# Patient Record
Sex: Male | Born: 1955 | Race: White | Hispanic: No | Smoking: Never smoker
Health system: Southern US, Community
[De-identification: ages and names within clinical notes are randomized; demographics above are authoritative.]

## PROBLEM LIST (undated history)

## (undated) DIAGNOSIS — R7989 Other specified abnormal findings of blood chemistry: Secondary | ICD-10-CM

## (undated) DIAGNOSIS — K219 Gastro-esophageal reflux disease without esophagitis: Secondary | ICD-10-CM

## (undated) DIAGNOSIS — J449 Chronic obstructive pulmonary disease, unspecified: Secondary | ICD-10-CM

## (undated) DIAGNOSIS — J45909 Unspecified asthma, uncomplicated: Secondary | ICD-10-CM

## (undated) DIAGNOSIS — I214 Non-ST elevation (NSTEMI) myocardial infarction: Principal | ICD-10-CM

## (undated) DIAGNOSIS — I2510 Atherosclerotic heart disease of native coronary artery without angina pectoris: Secondary | ICD-10-CM

## (undated) DIAGNOSIS — R079 Chest pain, unspecified: Secondary | ICD-10-CM

## (undated) DIAGNOSIS — F988 Other specified behavioral and emotional disorders with onset usually occurring in childhood and adolescence: Secondary | ICD-10-CM

## (undated) DIAGNOSIS — E785 Hyperlipidemia, unspecified: Secondary | ICD-10-CM

## (undated) DIAGNOSIS — C801 Malignant (primary) neoplasm, unspecified: Secondary | ICD-10-CM

## (undated) DIAGNOSIS — C9110 Chronic lymphocytic leukemia of B-cell type not having achieved remission: Secondary | ICD-10-CM

## (undated) HISTORY — DX: Non-ST elevation (NSTEMI) myocardial infarction: I21.4

## (undated) HISTORY — DX: Unspecified asthma, uncomplicated: J45.909

## (undated) HISTORY — PX: OTHER SURGICAL HISTORY: SHX169

---

## 1999-07-07 ENCOUNTER — Ambulatory Visit (HOSPITAL_COMMUNITY): Admission: RE | Admit: 1999-07-07 | Discharge: 1999-07-07 | Payer: Self-pay | Admitting: Gastroenterology

## 2000-04-15 ENCOUNTER — Ambulatory Visit (HOSPITAL_COMMUNITY): Admission: RE | Admit: 2000-04-15 | Discharge: 2000-04-15 | Payer: Self-pay | Admitting: Gastroenterology

## 2003-04-27 ENCOUNTER — Encounter: Payer: Self-pay | Admitting: Emergency Medicine

## 2003-04-27 ENCOUNTER — Emergency Department (HOSPITAL_COMMUNITY): Admission: EM | Admit: 2003-04-27 | Discharge: 2003-04-28 | Payer: Self-pay | Admitting: Emergency Medicine

## 2012-04-01 ENCOUNTER — Encounter (HOSPITAL_COMMUNITY): Payer: Self-pay | Admitting: *Deleted

## 2012-04-01 ENCOUNTER — Emergency Department (HOSPITAL_COMMUNITY)
Admission: EM | Admit: 2012-04-01 | Discharge: 2012-04-01 | Disposition: A | Discharge status: Home / Self Care | Payer: PRIVATE HEALTH INSURANCE | Attending: Emergency Medicine | Admitting: Emergency Medicine

## 2012-04-01 DIAGNOSIS — K219 Gastro-esophageal reflux disease without esophagitis: Secondary | ICD-10-CM | POA: Insufficient documentation

## 2012-04-01 DIAGNOSIS — R21 Rash and other nonspecific skin eruption: Secondary | ICD-10-CM

## 2012-04-01 HISTORY — DX: Other specified behavioral and emotional disorders with onset usually occurring in childhood and adolescence: F98.8

## 2012-04-01 HISTORY — DX: Other specified abnormal findings of blood chemistry: R79.89

## 2012-04-01 HISTORY — DX: Gastro-esophageal reflux disease without esophagitis: K21.9

## 2012-04-01 MED ORDER — PREDNISONE 20 MG PO TABS: 60.0000 mg | ORAL_TABLET | Freq: Every day | ORAL | Status: AC

## 2012-04-01 MED ORDER — HYDROXYZINE HCL 25 MG PO TABS: 25.0000 mg | ORAL_TABLET | Freq: Four times a day (QID) | ORAL | Status: AC

## 2012-04-01 NOTE — ED Notes (Addendum)
Pt c/o rash on arm, legs, and left and right side of abdomen. Pt states he noticed it at 2130 when he was putting his kids to sleep. Pt reports rash has gotten better. It went all the way down his ankles, but now it presents only by his thighs. He took Careers adviser and a hot shower. Reports itching and warmness. No tightness in chest or SOB. Pt was cleaning out drains and doing yard work today, but normally works indoors. No environmental changes or new food noted.

## 2012-04-01 NOTE — ED Provider Notes (Signed)
History     CSN: [id number]  Arrival date & time 04/01/12  0123   First MD Initiated Contact with Patient 04/01/12 0209     2:44 AM HPI Reports this evening developed a rash over his bilateral arms and legs. Reports red, itchy and fast spreading. Denies new foods, allergies, new soap, lotions or detergents. Denies wheezing, chest pain, SOB, or cough. Denies known allergies Patient is a 56 y.o. male presenting with rash. The history is provided by the patient.  Rash  This is a new problem. The problem has not changed since onset.The problem is associated with an unknown factor. There has been no fever. The rash is present on the left arm, left upper leg, right arm and right upper leg. The patient is experiencing no pain. Associated symptoms include itching. Pertinent negatives include no blisters, no pain and no weeping. He has tried anti-itch cream for the symptoms. The treatment provided no relief.    Past Medical History  Diagnosis Date  . GERD (gastroesophageal reflux disease)   . Low testosterone   . ADD (attention deficit disorder)     History reviewed. No pertinent past surgical history.  No family history on file.  History  Substance Use Topics  . Smoking status: Never Smoker   . Smokeless tobacco: Not on file  . Alcohol Use: No      Review of Systems  Constitutional: Negative for fever and chills.  Skin: Positive for itching and rash.  All other systems reviewed and are negative.    Allergies  Review of patient's allergies indicates no known allergies.  Home Medications   Current Outpatient Rx  Name Route Sig Dispense Refill  . ESOMEPRAZOLE MAGNESIUM 40 MG PO CPDR Oral Take 40 mg by mouth daily before breakfast.    . LISDEXAMFETAMINE DIMESYLATE 60 MG PO CAPS Oral Take 60 mg by mouth every morning.    Marland Kitchen OMEGA-3-ACID ETHYL ESTERS 1 G PO CAPS Oral Take 4 g by mouth daily.    Marland Kitchen OVER THE COUNTER MEDICATION Oral Take 4 capsules by mouth 2 (two) times daily. OTC.  Food & vegetable Vitamin    . TESTOSTERONE 75 MG IL PLLT Implant 1 each by Implant route as directed. Patient states that he receives this medication every 5 month for low testosterone      BP 125/80  Pulse 81  Temp 97.6 F (36.4 C) (Oral)  Resp 18  Wt 176 lb 6 oz (80.003 kg)  SpO2 100%  Physical Exam  Constitutional: He is oriented to person, place, and time. He appears well-developed and well-nourished.  HENT:  Head: Normocephalic and atraumatic.  Mouth/Throat: No oropharyngeal exudate.  Eyes: Pupils are equal, round, and reactive to light.  Cardiovascular: Normal rate and regular rhythm.   No murmur heard. Pulmonary/Chest: Effort normal and breath sounds normal. No stridor. No respiratory distress. He has no wheezes.  Lymphadenopathy:    He has no cervical adenopathy.  Neurological: He is alert and oriented to person, place, and time.  Skin: Skin is warm and dry. Rash (ERytematous, maculopapular rash along bilateral arms, legs, abdomen and back. ) noted. No erythema. No pallor.  Psychiatric: He has a normal mood and affect. His behavior is normal.    ED Course  Procedures   MDM     Unknown cause of rash. Suspect allergen vs contact. Will Give prednisone, and hydroxyzine. Also given referral to Derm. Pt voices understanding and is ready for d/c     Thomasene Lot, PA-C  04/01/12 0315 

## 2012-04-01 NOTE — ED Notes (Signed)
Pt c/o redness rash over arms and legs

## 2012-04-02 NOTE — ED Provider Notes (Signed)
Medical screening examination/treatment/procedure(s) were performed by non-physician practitioner and as supervising physician I was immediately available for consultation/collaboration.   Maleke Feria L Talib Headley, MD 04/02/12 0620 

## 2012-05-10 ENCOUNTER — Emergency Department
Admission: EM | Admit: 2012-05-10 | Discharge: 2012-05-10 | Disposition: A | Discharge status: Home / Self Care | Payer: PRIVATE HEALTH INSURANCE | Source: Home / Self Care | Attending: Family Medicine | Admitting: Family Medicine

## 2012-05-10 ENCOUNTER — Encounter: Payer: Self-pay | Admitting: *Deleted

## 2012-05-10 DIAGNOSIS — H6120 Impacted cerumen, unspecified ear: Secondary | ICD-10-CM

## 2012-05-10 DIAGNOSIS — H6121 Impacted cerumen, right ear: Secondary | ICD-10-CM

## 2012-05-10 DIAGNOSIS — J0100 Acute maxillary sinusitis, unspecified: Secondary | ICD-10-CM

## 2012-05-10 MED ORDER — NEOMYCIN-POLYMYXIN-HC 3.5-10000-1 OT SUSP: 4.0000 [drp] | Freq: Three times a day (TID) | OTIC | Status: AC

## 2012-05-10 MED ORDER — AMOXICILLIN 875 MG PO TABS: 875.0000 mg | ORAL_TABLET | Freq: Two times a day (BID) | ORAL | Status: AC

## 2012-05-10 NOTE — ED Notes (Signed)
Pt c/o nasal congestion, sinus pressure, cough,and post nasal drip x 2-3 wks.  Denies fever. He has taken zyrtec and zyrtec D with no relief.

## 2012-05-10 NOTE — ED Provider Notes (Signed)
History     CSN: [id number]  Arrival date & time 05/10/12  1555   First MD Initiated Contact with Patient 05/10/12 1618      Chief Complaint  Patient presents with  . Nasal Congestion     HPI Comments: Pt c/o nasal congestion, sinus pressure, cough,and post nasal drip x 2-3 wks.  Denies fever. He has taken zyrtec and zyrtec D with no relief. He also complains of his right ear being clogged with decreased hearing.  He states that he has a history of rhinitis.  The history is provided by the patient.    Past Medical History  Diagnosis Date  . GERD (gastroesophageal reflux disease)   . Low testosterone   . ADD (attention deficit disorder)     History reviewed. No pertinent past surgical history.  Family History  Problem Relation Age of Onset  . Emphysema Mother   . Heart failure Father     History  Substance Use Topics  . Smoking status: Never Smoker   . Smokeless tobacco: Not on file  . Alcohol Use: Yes      Review of Systems No sore throat + mild cough No pleuritic pain No wheezing + nasal congestion + post-nasal drainage + sinus pain/pressure No itchy/red eyes + right earache No hemoptysis No SOB ? fever, + chills No nausea No vomiting No abdominal pain No diarrhea No urinary symptoms No skin rashes + fatigue No myalgias + headache Used OTC meds without relief  Allergies  Review of patient's allergies indicates no known allergies.  Home Medications   Current Outpatient Rx  Name Route Sig Dispense Refill  . AMOXICILLIN 875 MG PO TABS Oral Take 1 tablet (875 mg total) by mouth 2 (two) times daily. 28 tablet 0  . ESOMEPRAZOLE MAGNESIUM 40 MG PO CPDR Oral Take 40 mg by mouth daily before breakfast.    . LISDEXAMFETAMINE DIMESYLATE 60 MG PO CAPS Oral Take 60 mg by mouth every morning.    Marland Kitchen NEOMYCIN-POLYMYXIN-HC 3.5-10000-1 OT SUSP Right Ear Place 4 drops into the right ear 3 (three) times daily. 7.5 mL 0  . OMEGA-3-ACID ETHYL ESTERS 1 G PO CAPS  Oral Take 4 g by mouth daily.    Marland Kitchen OVER THE COUNTER MEDICATION Oral Take 4 capsules by mouth 2 (two) times daily. OTC. Food & vegetable Vitamin    . TESTOSTERONE 75 MG IL PLLT Implant 1 each by Implant route as directed. Patient states that he receives this medication every 5 month for low testosterone      BP 106/73  Pulse 101  Temp 98 F (36.7 C) (Oral)  Resp 16  Ht 5\' 11"  (1.803 m)  Wt 167 lb (75.751 kg)  BMI 23.29 kg/m2  SpO2 96%  Physical Exam Nursing notes and Vital Signs reviewed. Appearance:  Patient appears healthy, stated age, and in no acute distress Eyes:  Pupils are equal, round, and reactive to light and accomodation.  Extraocular movement is intact.  Conjunctivae are not inflamed  Ears:   Right canal completely occluded with cerumen.  Left canal and tympanic membrane normal.  Nose:  Moderately congested turbinates.  Maxillary sinus tenderness is present.  Pharynx:  Normal Neck:  Supple.   No adenopathy Lungs:  Clear to auscultation.  Breath sounds are equal.  Heart:  Regular rate and rhythm without murmurs, rubs, or gallops.  Abdomen:  Nontender without masses or hepatosplenomegaly.  Bowel sounds are present.  No CVA or flank tenderness.  Extremities:  No edema.  No calf tenderness Skin:  No rash present.   ED Course  Procedures  Right ear lavage:  Post lavage, right tympanic membrane is normal.  Right distal canal is erythematous       1. Acute maxillary sinusitis   2. Impacted cerumen of right ear       MDM  Begin amoxicillin for two weeks. Begin Cortisporin otic suspension in right canal for 3 to 4 days to minimize possibility of developing an otitis externa. Take Mucinex D (guaifenesin with decongestant) twice daily for congestion.  Increase fluid intake, rest. May use Afrin nasal spray (or generic oxymetazoline) twice daily for about 5 days.  Also recommend using saline nasal spray several times daily and saline nasal irrigation (AYR is a common  brand) Stop all antihistamines for now, and other non-prescription cough/cold preparations. Use prescription ear drops in right ear for 3 to 4 days. Followup with ENT if not resolved two weeks.        Lattie Haw, MD 05/11/12 1001

## 2012-08-07 DIAGNOSIS — I214 Non-ST elevation (NSTEMI) myocardial infarction: Secondary | ICD-10-CM

## 2012-08-07 HISTORY — DX: Non-ST elevation (NSTEMI) myocardial infarction: I21.4

## 2012-08-18 ENCOUNTER — Inpatient Hospital Stay (HOSPITAL_COMMUNITY)
Admission: EM | Admit: 2012-08-18 | Discharge: 2012-08-19 | DRG: 247 | Disposition: A | Discharge status: Home / Self Care | Payer: PRIVATE HEALTH INSURANCE | Attending: Cardiology | Admitting: Cardiology

## 2012-08-18 ENCOUNTER — Encounter (HOSPITAL_COMMUNITY)
Admission: EM | Disposition: A | Discharge status: Home / Self Care | Payer: Self-pay | Source: Home / Self Care | Attending: Cardiology

## 2012-08-18 ENCOUNTER — Encounter (HOSPITAL_COMMUNITY): Payer: Self-pay | Admitting: *Deleted

## 2012-08-18 ENCOUNTER — Emergency Department (HOSPITAL_COMMUNITY): Payer: PRIVATE HEALTH INSURANCE

## 2012-08-18 DIAGNOSIS — I2510 Atherosclerotic heart disease of native coronary artery without angina pectoris: Secondary | ICD-10-CM

## 2012-08-18 DIAGNOSIS — E785 Hyperlipidemia, unspecified: Secondary | ICD-10-CM | POA: Diagnosis present

## 2012-08-18 DIAGNOSIS — I214 Non-ST elevation (NSTEMI) myocardial infarction: Secondary | ICD-10-CM

## 2012-08-18 DIAGNOSIS — K219 Gastro-esophageal reflux disease without esophagitis: Secondary | ICD-10-CM | POA: Diagnosis present

## 2012-08-18 DIAGNOSIS — R079 Chest pain, unspecified: Secondary | ICD-10-CM

## 2012-08-18 DIAGNOSIS — Z955 Presence of coronary angioplasty implant and graft: Secondary | ICD-10-CM

## 2012-08-18 HISTORY — PX: LEFT HEART CATHETERIZATION WITH CORONARY ANGIOGRAM: SHX5451

## 2012-08-18 HISTORY — DX: Hyperlipidemia, unspecified: E78.5

## 2012-08-18 HISTORY — PX: CORONARY ANGIOPLASTY WITH STENT PLACEMENT: SHX49

## 2012-08-18 HISTORY — PX: PERCUTANEOUS CORONARY STENT INTERVENTION (PCI-S): SHX5485

## 2012-08-18 LAB — TROPONIN I: Troponin I: 4.16 ng/mL (ref <0.30)

## 2012-08-18 LAB — CBC WITH DIFFERENTIAL/PLATELET
Basophils Relative: 0 % (ref 0–1)
Eosinophils Relative: 5 % (ref 0–5)
HCT: 44.9 % (ref 39.0–52.0)
Hemoglobin: 15.9 g/dL (ref 13.0–17.0)
Lymphocytes Relative: 38 % (ref 12–46)
Lymphs Abs: 4.8 10*3/uL — ABNORMAL HIGH (ref 0.7–4.0)
MCV: 95.5 fL (ref 78.0–100.0)
Monocytes Relative: 9 % (ref 3–12)
Neutro Abs: 6.1 10*3/uL (ref 1.7–7.7)
WBC: 12.6 10*3/uL — ABNORMAL HIGH (ref 4.0–10.5)

## 2012-08-18 LAB — BASIC METABOLIC PANEL
BUN: 15 mg/dL (ref 6–23)
CO2: 24 mEq/L (ref 19–32)
Chloride: 101 mEq/L (ref 96–112)
GFR calc Af Amer: >90 mL/min (ref >90)
Glucose, Bld: 102 mg/dL — ABNORMAL HIGH (ref 70–99)
Potassium: 3.9 mEq/L (ref 3.5–5.1)

## 2012-08-18 SURGERY — LEFT HEART CATHETERIZATION WITH CORONARY ANGIOGRAM
Anesthesia: LOCAL

## 2012-08-18 MED ORDER — SODIUM CHLORIDE 0.9 % IV SOLN: 250.0000 mL | INTRAVENOUS | Status: DC | PRN

## 2012-08-18 MED ORDER — OXYCODONE-ACETAMINOPHEN 5-325 MG PO TABS
1.0000 | ORAL_TABLET | ORAL | Status: DC | PRN
Start: 2012-08-18 — End: 2012-08-19

## 2012-08-18 MED ORDER — ONDANSETRON HCL 4 MG/2ML IJ SOLN: 4.0000 mg | Freq: Four times a day (QID) | INTRAMUSCULAR | Status: DC | PRN

## 2012-08-18 MED ORDER — SODIUM CHLORIDE 0.9 % IV SOLN
INTRAVENOUS | Status: DC
  Administered 2012-08-18: 10:00:00 via INTRAVENOUS

## 2012-08-18 MED ORDER — ACETAMINOPHEN 325 MG PO TABS
650.0000 mg | ORAL_TABLET | ORAL | Status: DC | PRN
  Administered 2012-08-18: 22:00:00 650 mg via ORAL
  Filled 2012-08-18: qty 2

## 2012-08-18 MED ORDER — MIDAZOLAM HCL 2 MG/2ML IJ SOLN
INTRAMUSCULAR | Status: AC
  Filled 2012-08-18: qty 2

## 2012-08-18 MED ORDER — NITROGLYCERIN IN D5W 200-5 MCG/ML-% IV SOLN
10.0000 ug/min | INTRAVENOUS | Status: DC
  Administered 2012-08-18: 10 ug/min via INTRAVENOUS
  Filled 2012-08-18: qty 250

## 2012-08-18 MED ORDER — PRASUGREL HCL 10 MG PO TABS
ORAL_TABLET | ORAL | Status: AC
  Filled 2012-08-18: qty 6

## 2012-08-18 MED ORDER — ACETAMINOPHEN 325 MG PO TABS: 650.0000 mg | ORAL_TABLET | ORAL | Status: DC | PRN

## 2012-08-18 MED ORDER — HEPARIN (PORCINE) IN NACL 100-0.45 UNIT/ML-% IJ SOLN
1000.0000 [IU]/h | INTRAMUSCULAR | Status: DC
  Administered 2012-08-18: 1000 [IU]/h via INTRAVENOUS
  Filled 2012-08-18: qty 250

## 2012-08-18 MED ORDER — SODIUM CHLORIDE 0.9 % IV SOLN
INTRAVENOUS | Status: AC
  Administered 2012-08-18: 17:00:00 via INTRAVENOUS

## 2012-08-18 MED ORDER — DIAZEPAM 5 MG PO TABS: 5.0000 mg | ORAL_TABLET | ORAL | Status: DC

## 2012-08-18 MED ORDER — PRASUGREL HCL 10 MG PO TABS
10.0000 mg | ORAL_TABLET | Freq: Every day | ORAL | Status: DC
  Administered 2012-08-19: 10 mg via ORAL
  Filled 2012-08-18 (×2): qty 1

## 2012-08-18 MED ORDER — ASPIRIN 81 MG PO CHEW
81.0000 mg | CHEWABLE_TABLET | Freq: Every day | ORAL | Status: DC
  Administered 2012-08-19: 09:00:00 81 mg via ORAL
  Filled 2012-08-18: qty 1

## 2012-08-18 MED ORDER — ALPRAZOLAM 0.25 MG PO TABS: 0.2500 mg | ORAL_TABLET | Freq: Two times a day (BID) | ORAL | Status: DC | PRN

## 2012-08-18 MED ORDER — PANTOPRAZOLE SODIUM 40 MG PO TBEC
80.0000 mg | DELAYED_RELEASE_TABLET | Freq: Every day | ORAL | Status: DC
  Filled 2012-08-18: qty 2

## 2012-08-18 MED ORDER — MORPHINE SULFATE 2 MG/ML IJ SOLN: 2.0000 mg | INTRAMUSCULAR | Status: DC | PRN

## 2012-08-18 MED ORDER — SODIUM CHLORIDE 0.9 % IJ SOLN: 3.0000 mL | INTRAMUSCULAR | Status: DC | PRN

## 2012-08-18 MED ORDER — OMEGA-3-ACID ETHYL ESTERS 1 G PO CAPS
4.0000 g | ORAL_CAPSULE | Freq: Every day | ORAL | Status: DC
  Administered 2012-08-19: 4 g via ORAL
  Filled 2012-08-18: qty 4

## 2012-08-18 MED ORDER — ATORVASTATIN CALCIUM 80 MG PO TABS
80.0000 mg | ORAL_TABLET | Freq: Every day | ORAL | Status: DC
  Administered 2012-08-18: 80 mg via ORAL
  Filled 2012-08-18 (×2): qty 1

## 2012-08-18 MED ORDER — NITROGLYCERIN 0.2 MG/ML ON CALL CATH LAB
INTRAVENOUS | Status: AC
  Filled 2012-08-18: qty 1

## 2012-08-18 MED ORDER — FENTANYL CITRATE 0.05 MG/ML IJ SOLN
INTRAMUSCULAR | Status: AC
  Filled 2012-08-18: qty 2

## 2012-08-18 MED ORDER — HEPARIN (PORCINE) IN NACL 2-0.9 UNIT/ML-% IJ SOLN
INTRAMUSCULAR | Status: AC
  Filled 2012-08-18: qty 1000

## 2012-08-18 MED ORDER — NITROGLYCERIN 0.4 MG SL SUBL: 0.4000 mg | SUBLINGUAL_TABLET | SUBLINGUAL | Status: DC | PRN

## 2012-08-18 MED ORDER — SODIUM CHLORIDE 0.9 % IJ SOLN: 3.0000 mL | Freq: Two times a day (BID) | INTRAMUSCULAR | Status: DC

## 2012-08-18 MED ORDER — METHYLPHENIDATE HCL ER 10 MG PO TBCR: 28.0000 mg | EXTENDED_RELEASE_TABLET | Freq: Every day | ORAL | Status: DC

## 2012-08-18 MED ORDER — LIDOCAINE HCL (PF) 1 % IJ SOLN
INTRAMUSCULAR | Status: AC
  Filled 2012-08-18: qty 30

## 2012-08-18 MED ORDER — ASPIRIN 81 MG PO CHEW: 324.0000 mg | CHEWABLE_TABLET | ORAL | Status: DC

## 2012-08-18 MED ORDER — ZOLPIDEM TARTRATE 5 MG PO TABS: 5.0000 mg | ORAL_TABLET | Freq: Every evening | ORAL | Status: DC | PRN

## 2012-08-18 MED ORDER — BIVALIRUDIN 250 MG IV SOLR
INTRAVENOUS | Status: AC
  Filled 2012-08-18: qty 250

## 2012-08-18 MED ORDER — HEPARIN BOLUS VIA INFUSION
4000.0000 [IU] | Freq: Once | INTRAVENOUS | Status: AC
  Administered 2012-08-18: 4000 [IU] via INTRAVENOUS

## 2012-08-18 NOTE — ED Notes (Signed)
Patient presents via EMS with c/o chest pain.  The pain was straight across his chest - no other complaints  No SOB, diaphoresis, etc.  Took 4 81mg  ASA at home and 1 NTG by EMS with no relief.  States his pain is now a 6 or 7

## 2012-08-18 NOTE — Progress Notes (Signed)
ANTICOAGULATION CONSULT NOTE - Initial Consult  Pharmacy Consult for heparin Indication: chest pain/ACS  No Known Allergies  Patient Measurements:   Heparin Dosing Weight: 75.8kg  Vital Signs: Temp: 97.6 F (36.4 C) (12/12 0503) Temp src: Oral (12/12 0503) BP: 90/62 mmHg (12/12 0915) Pulse Rate: 72  (12/12 0915)  Labs:  Basename 08/18/12 0800 08/18/12 0508  HGB -- 15.9  HCT -- 44.9  PLT -- 236  APTT -- --  LABPROT -- --  INR -- --  HEPARINUNFRC -- --  CREATININE -- 0.86  CKTOTAL -- --  CKMB -- --  TROPONINI 4.72* <0.30    The CrCl is unknown because both a height and weight (above a minimum accepted value) are required for this calculation.   Medical History: Past Medical History  Diagnosis Date  . GERD (gastroesophageal reflux disease)   . Low testosterone   . ADD (attention deficit disorder)    Assessment: 41 yom presented to the ED with chest pain that feels more epigastric in nature. His initial troponin was negative but now elevated at 4.72. He is not on any anticoagulants PTA. His CBC is WNL.  Goal of Therapy:  Heparin level 0.3-0.7 units/ml Monitor platelets by anticoagulation protocol: Yes   Plan:  1. Heparin bolus 4000 units IV x 1 2. Heparin gtt 1000 units/hr 3. Check a 6 hour heparin level 4. Daily heparin level and CBC  Melvin Martin, Melvin Martin 08/18/2012,10:23 AM

## 2012-08-18 NOTE — H&P (Signed)
History and Physical   Patient ID: Melvin Martin MRN: [id number], DOB/AGE: 01/27/1956 56 y.o. Date of Encounter: 08/18/2012  Primary Physician: Kari Baars, MD Primary Cardiologist: none  Chief Complaint:  NSTEMI  HPI: Melvin Martin is a 56 year old male with no previous cardiac history who was wakened by severe substernal chest pain, radiating into both arms to the elbow. No change with position or deep inspiration. No associated symptoms. EMS was called, he received ASA, NTG without relief. It was initially a 10/10, down to a 6/10 by arrival to the ER. Since then, it has decreased and is now only aching in his left arm. He has never had this pain before. His initial Troponin was not elevated, but the second one was > 4. He is much more comfortable now, but not pain-free.  Past Medical History  Diagnosis Date  . GERD (gastroesophageal reflux disease)   . Low testosterone   . ADD (attention deficit disorder)      Surgical History:  Past Surgical History  Procedure Date  . Cyst removal, mouth      I have reviewed the patient's current medications. Prior to Admission medications   Medication Sig Start Date End Date Taking? Authorizing Provider  esomeprazole (NEXIUM) 40 MG capsule Take 40 mg by mouth daily before breakfast.   Yes Historical Provider, MD  methylphenidate (CONCERTA) 27 MG CR tablet Take 27 mg by mouth every morning.   Yes Historical Provider, MD  omega-3 acid ethyl esters (LOVAZA) 1 G capsule Take 4 g by mouth daily.   Yes Historical Provider, MD  OVER THE COUNTER MEDICATION Take 4 capsules by mouth 2 (two) times daily. OTC. Food & vegetable Vitamin   Yes Historical Provider, MD  Testosterone (TESTOPEL) 75 MG PLLT 1 each by Implant route as directed. Patient states that he receives this medication every 5 month for low testosterone   Yes Historical Provider, MD    Scheduled Meds:  Continuous Infusions:    . sodium chloride 75 mL/hr at 08/18/12 1025  . heparin      . heparin    . nitroGLYCERIN 10 mcg/min (08/18/12 1022)   PRN Meds:.nitroGLYCERIN  Allergies: No Known Allergies  History   Social History  . Marital Status: Married    Spouse Name: N/A    Number of Children: N/A  . Years of Education: N/A   Occupational History  . Not on file.   Social History Main Topics  . Smoking status: Never Smoker   . Smokeless tobacco: Never Used  . Alcohol Use: Yes  . Drug Use: No  . Sexually Active: Not on file   Other Topics Concern  . Not on file   Social History Narrative   Lives with wife and 2 small children    Family History  Problem Relation Age of Onset  . Emphysema Mother   . Heart failure Father   . Heart attack Father   . Heart attack Cousin    Review of Systems: Occasional MS aches and pains, no recent illnesses, fevers or chills. Never has melena, reflux symptoms generally well-controlled. Full 14-point review of systems otherwise negative except as noted above.  Physical Exam: Blood pressure 90/62, pulse 72, temperature 97.6 F (36.4 C), temperature source Oral, resp. rate 15, SpO2 100.00%. General: Well developed, well nourished, male in no acute distress. Head: Normocephalic, atraumatic, sclera non-icteric, no xanthomas, nares are without discharge. Dentition: good Neck: No carotid bruits. JVD not elevated. No thyromegally Lungs: Good expansion  bilaterally. without wheezes or rhonchi.  Heart: Regular rate and rhythm with S1 S2.  No S3 or S4.  No murmur, no rubs, or gallops appreciated. Abdomen: Soft, non-tender, non-distended with normoactive bowel sounds. No hepatomegaly. No rebound/guarding. No obvious abdominal masses. Msk:  Strength and tone appear normal for age. No joint deformities or effusions, no spine or costo-vertebral angle tenderness. Extremities: No clubbing or cyanosis. No edema.  Distal pedal pulses are 2+ in 4 extrem Neuro: Alert and oriented X 3. Moves all extremities spontaneously. No focal deficits  noted. Psych:  Responds to questions appropriately with a normal affect. Skin: No rashes or lesions noted  Labs:   Lab Results  Component Value Date   WBC 12.6* 08/18/2012   HGB 15.9 08/18/2012   HCT 44.9 08/18/2012   MCV 95.5 08/18/2012   PLT 236 08/18/2012   No results found for this basename: INR in the last 72 hours   Lab 08/18/12 0508  NA 137  K 3.9  CL 101  CO2 24  BUN 15  CREATININE 0.86  CALCIUM 9.6  PROT --  BILITOT --  ALKPHOS --  ALT --  AST --  GLUCOSE 102*    Basename 08/18/12 0800 08/18/12 0508  CKTOTAL -- --  CKMB -- --  TROPONINI 4.72* <0.30   Radiology/Studies:  Dg Chest Portable 1 View 08/18/2012  *RADIOLOGY REPORT*  Clinical Data: Chest pain  PORTABLE CHEST - 1 VIEW  Comparison: None.  Findings: Shallow inspiration.  Normal heart size and pulmonary vascularity.  Linear atelectasis or infiltration in the lung bases. No blunting of costophrenic angles.  No pneumothorax.  Mediastinal contours appear intact.  IMPRESSION: Shallow inspiration with infiltration or atelectasis in both lung bases.   Original Report Authenticated By: Burman Nieves, M.D.    ECG: 18-Aug-2012 04:58:40 [facility]/ED ROUTINE RECORD SINUS RHYTHM ~ normal P axis, V-rate 50- 99 Normal ECG 42mm/s 24mm/mV 150Hz  8.0.1 12SL 235 CID: [id number] Referred by: Confirmed By: Nelva Nay MD Vent. rate 69 BPM PR interval 192 ms QRS duration 80 ms QT/QTc 372/398 ms P-R-T axes 15 65 58  ASSESSMENT AND PLAN:  Principal Problem:  *Non-ST elevation myocardial infarction (NSTEMI), initial care episode - will add IV NTG and heparin, cath today, lab aware. The risks and benefits of a cardiac catheterization including, but not limited to, death, stroke, MI, kidney damage and bleeding were discussed with the patient who indicates understanding and agrees to proceed. Add ASA to meds, HR low 50s so no BB for now.  Hyperlipidemia - will check in am, empiric statin.  Active  Problems:  GERD (gastroesophageal reflux disease) - continue Rx.   Signed,  Bjorn Loser Barrett PA-C 08/18/2012, 10:31 AM Patient seen and examined and history reviewed. Agree with above findings and plan. Very pleasant 56 yo WM with family history of early CAD, and history of low HDL presents with severe SSCP 9/10 that awoke him from sleep and lasted about 4 hours. Still has mild pain in elbows. Ecg and exam are normal. Troponin is elevated >4. Will admit. IV heparin and Ntg. Start po metoprolol. Plan urgent cardiac cath today.The procedure and risks were reviewed including but not limited to death, myocardial infarction, stroke, arrythmias, bleeding, transfusion, emergency surgery, dye allergy, or renal dysfunction. The patient voices understanding and is agreeable to proceed.  Theron Arista [facility] 08/18/2012 11:01 AM

## 2012-08-18 NOTE — ED Notes (Signed)
Pt. Denies any chest pain or sob.

## 2012-08-18 NOTE — ED Notes (Signed)
Pt.has had shaving wrist and groin area.for the cath lab.

## 2012-08-18 NOTE — ED Provider Notes (Signed)
House available for supervision, consultation to the relevant portion of this patient's CDU care.  He was previously placed on the chest pain protocol.  When a second troponin was elevated, although he was not in any pain, we consulted cardiology, initiated drip of heparin, and the patient was admitted for further evaluation and management.  Gerhard Munch, MD 08/18/12 [phone]

## 2012-08-18 NOTE — ED Notes (Signed)
Reported Elevated/Panic Troponin 4.72 to [employer], [place]

## 2012-08-18 NOTE — CV Procedure (Signed)
   Cardiac Catheterization Procedure Note  Name: Melvin Martin MRN: [id number] DOB: 01-26-1956  Procedure: Left Heart Cath, Selective Coronary Angiography, LV angiography, PTCA and stenting of the LAD and ramus intermedius  Indication: NSTEMI.  Procedural Details:  The right wrist was prepped, draped, and anesthetized with 1% lidocaine. Using the modified Seldinger technique, a 5 French sheath was introduced into the right radial artery. 3 mg of verapamil was administered through the sheath, weight-based unfractionated heparin was administered intravenously. Standard Judkins catheters were used for selective coronary angiography and left ventriculography. Catheter exchanges were performed over an exchange length guidewire.  PROCEDURAL FINDINGS Hemodynamics: AO 98/60 LV 100/7   Coronary angiography: Coronary dominance: right  Left mainstem: widely patent  Left anterior descending (LAD): Heavily diseased vessel with ectasia and nonobstructive proximal plaque. The mid-vessel has diffuse 75% stenosis with irregularity and lesion hypodensity. The distal vessel is patent without disease. There is small D2 branch with total occlusion and contrast stain.  Left circumflex (LCx): Large vessel, Ramus intermedius has critical 99% stenosis in the mid portion and mild nonobstructive disease in the prox vessel. The AV groove circ and OM branches are patent with nonobstructive disease.   Right coronary artery (RCA): Dominant, diffuse irregularity but no significant angiographic disease. PDA and PLA branches patent.   Left ventriculography: Left ventricular systolic function preserved with an LVEF of 55%. There is mild hypokinesis of the anterolateral wall.   PCI Note:  Following the diagnostic procedure, the decision was made to proceed with PCI. There was critical stenosis of the ramus branch and severe stenosis with hypodensity and irregularity in the mid-LAD. The radial sheath was upsized to a 6  [place]. Weight-based bivalirudin was given for anticoagulation. Effient 60 mg was administered. Once a therapeutic ACT was achieved, a 6 [place] XB-LAD 3.5 cm  guide catheter was inserted.  A BMW coronary guidewire was used to cross the lesion in the Ramus.  The lesion was predilated with a 2.5x15 mm balloon.  The lesion was then stented with a 2.75 x 16 mm  Promus Premier DES stent.  The stent was postdilated with a 3.0 x 12 mm noncompliant balloon.  Following PCI, there was 0% residual stenosis and TIMI-3 flow. Attention was then turned to the LAD. The vessel was wired with the same BMW wire. A 3.75x20 mm [place] balloon was used to measure the length of the lesion (it was not dilated). The lesion was then stented with a 3.5x32 mm Promus Premier DES. The stent was post-dilated with the 3.75 [place] balloon to high pressure. Final angiography confirmed an excellent result with TIMI-3 flow and 0% residual stenosis. The patient tolerated the procedure well. There were no immediate procedural complications. A TR band was used for radial hemostasis. The patient was transferred to the post catheterization recovery area for further monitoring.  PCI Data: LESION 1 Vessel - Ramus/Segment - mid Percent Stenosis (pre)  99 TIMI-flow 3 Stent 2.75x16 mm Promus DES Percent Stenosis (post) 0 TIMI-flow (post) 3  LESION 2 Vessel - LAD/Segment - mid Percent Stenosis (pre)  75 TIMI-flow 3 Stent 3.5x32 mm Promus DES Percent Stenosis (post) 0 TIMI-flow (post) 3  Final Conclusions:   1. 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 Nonobstructive RCA stenosis 3. Mild LV contraction abnormality with overall normal LVEF 55%  Recommendations:  DAPT with ASA and effient x 12 months, aggressive risk modification.  Tonny Bollman 08/18/2012, 6:22 PM

## 2012-08-18 NOTE — ED Provider Notes (Signed)
7:10 AM Patient moved to CDU under chest pain protocol. Patient resting comfortably at present without return of chest pain. Lungs CTA bilaterally. S1/S2, RRR, no murmur. Abdomen soft, bowel sounds present. Strong distal pulses palpated all extremities. Sinus rhythm on monitor without ectopy. Troponin negative x 1. 12 lead reviewed, no indication of ischemia. Patient scheduled for Coronary CT this afternoon. Diagnostic and treatment plan discussed with patient.  9:00 AM Informed of elevated second trop (4.72). Pt with significant family hx, but denies HTN, HLD, DM, or smoking. 324 ASA taken prior to arrival. Pt currently CP free. PCP Dr. Clelia Croft at [facility]. Cardiology Consulted and will admit pt for cardiac cath, heperin drip to be started  CRITICAL CARE Performed by: Jaci Carrel   Total critical care time: 30  Critical care time was exclusive of separately billable procedures and treating other patients.  Critical care was necessary to treat or prevent imminent or life-threatening deterioration.  Critical care was time spent personally by me on the following activities: development of treatment plan with patient and/or surrogate as well as nursing, discussions with consultants, evaluation of patient's response to treatment, examination of patient, obtaining history from patient or surrogate, ordering and performing treatments and interventions, ordering and review of laboratory studies, ordering and review of radiographic studies, pulse oximetry and re-evaluation of patient's condition.    Jaci Carrel, [place] 08/18/12 1157

## 2012-08-18 NOTE — ED Notes (Signed)
Patient states that he ate a casa a dia and some shrimp for dinner and before bed had a banana and milk.  Woke up with pain across his chest and took a Nexium.  States he has missed a couple of days of the Nexium and when he does that the pain usually gets worse.

## 2012-08-18 NOTE — ED Notes (Signed)
Patient states "this feels just like my reflux"

## 2012-08-18 NOTE — Progress Notes (Signed)
TR BAND REMOVAL  LOCATION:  right radial  DEFLATED PER PROTOCOL:  yes  TIME BAND OFF / DRESSING APPLIED:   1845   SITE UPON ARRIVAL:   Level 0  SITE AFTER BAND REMOVAL:  Level 0  REVERSE ALLEN'S TEST:    positive  CIRCULATION SENSATION AND MOVEMENT:  Within Normal Limits  yes  COMMENTS:    

## 2012-08-18 NOTE — Interval H&P Note (Signed)
History and Physical Interval Note:  08/18/2012 1:06 PM  Melvin Martin  has presented today for surgery, with the diagnosis of cp  The various methods of treatment have been discussed with the patient and family. After consideration of risks, benefits and other options for treatment, the patient has consented to  Procedure(s) (LRB) with comments: LEFT HEART CATHETERIZATION WITH CORONARY ANGIOGRAM (N/A) as a surgical intervention .  The patient's history has been reviewed, patient examined, no change in status, stable for surgery.  I have reviewed the patient's chart and labs.  Questions were answered to the patient's satisfaction.     Tonny Bollman

## 2012-08-18 NOTE — ED Provider Notes (Signed)
History     CSN: [id number]  Arrival date & time 08/18/12  4098   First MD Initiated Contact with Patient 08/18/12 0501      Chief Complaint  Patient presents with  . Chest Pain    (Consider location/radiation/quality/duration/timing/severity/associated sxs/prior treatment) Patient is a 56 y.o. male presenting with chest pain. The history is provided by the patient.  Chest Pain   He was awakened at about 3 AM by severe chest pain across his chest into both arms. Pain was sharp and he rated it an 8/10. Nothing made it better nothing made it worse. There was transient nausea. He denies dyspnea or diaphoresis. Pain was not affected by body position or by breathing. He has a history of GERD, but this is much more severe than anything his head with GERD. His wife called 911 and was instructed to give him aspirin. She gave him 480 aspirin. EMS arrived and gave him nitroglycerin which did not give him relief, but did not give him a headache and did not burn under his tongue. Pain has subsided slightly and is now rated at 6/10 and is changed to a dull, achy pain. His only significant cardiac risk factor is a positive family history of coronary artery disease-his father had his first heart attack at age 25, and he had a cousin who had his first heart attack at age 70. He is a nonsmoker and denies history of hypertension or diabetes or hyperlipidemia.  Past Medical History  Diagnosis Date  . GERD (gastroesophageal reflux disease)   . Low testosterone   . ADD (attention deficit disorder)     History reviewed. No pertinent past surgical history.  Family History  Problem Relation Age of Onset  . Emphysema Mother   . Heart failure Father     History  Substance Use Topics  . Smoking status: Never Smoker   . Smokeless tobacco: Not on file  . Alcohol Use: Yes      Review of Systems  Cardiovascular: Positive for chest pain.  All other systems reviewed and are negative.    Allergies   Review of patient's allergies indicates no known allergies.  Home Medications   Current Outpatient Rx  Name  Route  Sig  Dispense  Refill  . ESOMEPRAZOLE MAGNESIUM 40 MG PO CPDR   Oral   Take 40 mg by mouth daily before breakfast.         . LISDEXAMFETAMINE DIMESYLATE 60 MG PO CAPS   Oral   Take 60 mg by mouth every morning.         Marland Kitchen OMEGA-3-ACID ETHYL ESTERS 1 G PO CAPS   Oral   Take 4 g by mouth daily.         Marland Kitchen OVER THE COUNTER MEDICATION   Oral   Take 4 capsules by mouth 2 (two) times daily. OTC. Food & vegetable Vitamin         . TESTOSTERONE 75 MG IL PLLT   Implant   1 each by Implant route as directed. Patient states that he receives this medication every 5 month for low testosterone           BP 115/79  Temp 97.6 F (36.4 C) (Oral)  Resp 13  Physical Exam  Nursing note and vitals reviewed. 56 year old male, resting comfortably and in no acute distress. Vital signs are normal. Oxygen saturation is 98%, which is normal. Head is normocephalic and atraumatic. PERRLA, EOMI. Oropharynx is clear. Neck is nontender  and supple without adenopathy or JVD. Back is nontender and there is no CVA tenderness. Lungs are clear without rales, wheezes, or rhonchi. Chest is nontender. Heart has regular rate and rhythm without murmur. Abdomen is soft, flat, nontender without masses or hepatosplenomegaly and peristalsis is normoactive. Extremities have no cyanosis or edema, full range of motion is present. Skin is warm and dry without rash. Neurologic: Mental status is normal, cranial nerves are intact, there are no motor or sensory deficits.   ED Course  Procedures (including critical care time)  Results for orders placed during the hospital encounter of 08/18/12  CBC WITH DIFFERENTIAL      Component Value Range   WBC 12.6 (*) 4.0 - 10.5 K/uL   RBC 4.70  4.22 - 5.81 MIL/uL   Hemoglobin 15.9  13.0 - 17.0 g/dL   HCT 16.1  09.6 - 04.5 %   MCV 95.5  78.0 - 100.0  fL   MCH 33.8  26.0 - 34.0 pg   MCHC 35.4  30.0 - 36.0 g/dL   RDW 40.9  81.1 - 91.4 %   Platelets 236  150 - 400 K/uL   Neutrophils Relative 48  43 - 77 %   Lymphocytes Relative 38  12 - 46 %   Monocytes Relative 9  3 - 12 %   Eosinophils Relative 5  0 - 5 %   Basophils Relative 0  0 - 1 %   Neutro Abs 6.1  1.7 - 7.7 K/uL   Lymphs Abs 4.8 (*) 0.7 - 4.0 K/uL   Monocytes Absolute 1.1 (*) 0.1 - 1.0 K/uL   Eosinophils Absolute 0.6  0.0 - 0.7 K/uL   Basophils Absolute 0.0  0.0 - 0.1 K/uL   WBC Morphology ATYPICAL LYMPHOCYTES    BASIC METABOLIC PANEL      Component Value Range   Sodium 137  135 - 145 mEq/L   Potassium 3.9  3.5 - 5.1 mEq/L   Chloride 101  96 - 112 mEq/L   CO2 24  19 - 32 mEq/L   Glucose, Bld 102 (*) 70 - 99 mg/dL   BUN 15  6 - 23 mg/dL   Creatinine, Ser 7.82  0.50 - 1.35 mg/dL   Calcium 9.6  8.4 - 95.6 mg/dL   GFR calc non Af Amer >90  >90 mL/min   GFR calc Af Amer >90  >90 mL/min  TROPONIN I      Component Value Range   Troponin I <0.30  <0.30 ng/mL   Dg Chest Portable 1 View  08/18/2012  *RADIOLOGY REPORT*  Clinical Data: Chest pain  PORTABLE CHEST - 1 VIEW  Comparison: None.  Findings: Shallow inspiration.  Normal heart size and pulmonary vascularity.  Linear atelectasis or infiltration in the lung bases. No blunting of costophrenic angles.  No pneumothorax.  Mediastinal contours appear intact.  IMPRESSION: Shallow inspiration with infiltration or atelectasis in both lung bases.   Original Report Authenticated By: Burman Nieves, M.D.      ECG shows normal sinus rhythm with a rate of 69, no ectopy. Normal axis. Normal P wave. Normal QRS. Normal intervals. Normal ST and T waves. Impression: normal ECG. When compared with ECG of 04/27/2003, no significant changes are seen.   1. Chest pain       MDM  Chest pain which is probably not cardiac. However, given his risk factors, he will be placed in CDU once his cardiac markers have come back and he is pain-free.  Plan  will be to get cardiac CT angiogram done later today.  He got complete relief of pain with nitroglycerin. He is being moved to the CDU and is coronary CT angiogram will be done later today.      Dione Booze, MD 08/18/12 [phone]

## 2012-08-19 LAB — CBC
HCT: 47.4 % (ref 39.0–52.0)
Hemoglobin: 16.6 g/dL (ref 13.0–17.0)
MCH: 33.2 pg (ref 26.0–34.0)
MCHC: 35 g/dL (ref 30.0–36.0)
RBC: 5 MIL/uL (ref 4.22–5.81)

## 2012-08-19 LAB — LIPID PANEL
LDL Cholesterol: 107 mg/dL — ABNORMAL HIGH (ref 0–99)
Total CHOL/HDL Ratio: 5.8 RATIO
Triglycerides: 141 mg/dL (ref <150)
VLDL: 28 mg/dL (ref 0–40)

## 2012-08-19 LAB — COMPREHENSIVE METABOLIC PANEL
ALT: 22 U/L (ref 0–53)
AST: 37 U/L (ref 0–37)
Calcium: 9.1 mg/dL (ref 8.4–10.5)
GFR calc Af Amer: >90 mL/min (ref >90)
Sodium: 137 mEq/L (ref 135–145)
Total Protein: 6.7 g/dL (ref 6.0–8.3)

## 2012-08-19 LAB — TSH: TSH: 1.564 u[IU]/mL (ref 0.350–4.500)

## 2012-08-19 MED ORDER — NITROGLYCERIN 0.4 MG SL SUBL: 0.4000 mg | SUBLINGUAL_TABLET | SUBLINGUAL | Status: DC | PRN

## 2012-08-19 MED ORDER — ATORVASTATIN CALCIUM 40 MG PO TABS
40.0000 mg | ORAL_TABLET | Freq: Every day | ORAL | Status: DC
  Filled 2012-08-19: qty 1

## 2012-08-19 MED ORDER — ASPIRIN 81 MG PO CHEW: 81.0000 mg | CHEWABLE_TABLET | Freq: Every day | ORAL | Status: DC

## 2012-08-19 MED ORDER — ATORVASTATIN CALCIUM 40 MG PO TABS: 40.0000 mg | ORAL_TABLET | Freq: Every day | ORAL | Status: DC

## 2012-08-19 MED ORDER — PRASUGREL HCL 10 MG PO TABS: 10.0000 mg | ORAL_TABLET | Freq: Every day | ORAL | Status: DC

## 2012-08-19 MED ORDER — METOPROLOL SUCCINATE ER 25 MG PO TB24: 25.0000 mg | ORAL_TABLET | Freq: Every day | ORAL | Status: DC

## 2012-08-19 MED ORDER — METOPROLOL SUCCINATE ER 25 MG PO TB24
25.0000 mg | ORAL_TABLET | Freq: Every day | ORAL | Status: DC
  Administered 2012-08-19: 09:00:00 25 mg via ORAL
  Filled 2012-08-19: qty 1

## 2012-08-19 MED FILL — Dextrose Inj 5%: INTRAVENOUS | Qty: 50 | Status: AC

## 2012-08-19 NOTE — Discharge Summary (Signed)
CARDIOLOGY DISCHARGE SUMMARY   Patient ID: Melvin Martin MRN: [id number] DOB/AGE: 54-Nov-1957 55 y.o.  Admit date: 08/18/2012 Discharge date: 08/19/2012  Primary Discharge Diagnosis:    *Non-ST elevation myocardial infarction (NSTEMI), initial care episode - s/p PCI with drug-eluting stents to the ramus and LAD  Secondary Discharge Diagnosis:   GERD (gastroesophageal reflux disease) Dyslipidemia with high LDL and low HDL  Procedures: Left Heart Cath, Selective Coronary Angiography, LV angiography, PTCA and stenting of the LAD and ramus    Hospital Course: Melvin Martin is a 56 year old male with no history of CAD. He woke with chest pain on 08/18/2012 he came to the emergency room. His pain had been going on for several hours and his initial troponin was within normal limits but the second one was greater than 4. He was admitted for further evaluation and treatment. He was started on IV heparin and IV nitroglycerin, but was still having some discomfort. His ECG did not have ST elevation but, with ongoing pain and elevated cardiac enzymes, he was taken to the cath lab.  The full cardiac catheterization results are below, he had dual vessel intervention and tolerated the procedure well. He was seen by cardiac rehabilitation and instructed on heart-healthy lifestyle modifications plus exercise guidelines. A lipid profile showed an elevated LDL plus a low HDL and he was started on a statin. Initially, he was bradycardic so beta blocker was not initiated, but he had some nonsustained VT overnight and was started on a low-dose beta blocker. He will need to follow his heart rate and blood pressure carefully. He had a chest x-ray performed which was reviewed and showed some atelectasis but we do not believe he has infiltrates.  On 08/19/2012, Melvin Martin was seen by Dr. [place] and by cardiac rehabilitation. He was ambulating without chest pain or shortness of breath and considered stable for discharge, to  follow up as an outpatient.  Labs:   Lab Results  Component Value Date   WBC 13.1* 08/19/2012   HGB 16.6 08/19/2012   HCT 47.4 08/19/2012   MCV 94.8 08/19/2012   PLT 227 08/19/2012     Lab 08/19/12 0540  NA 137  K 4.0  CL 102  CO2 26  BUN 12  CREATININE 0.94  CALCIUM 9.1  PROT 6.7  BILITOT 0.4  ALKPHOS 47  ALT 22  AST 37  GLUCOSE 97    Basename 08/19/12 0540 08/18/12 2252 08/18/12 1649  CKTOTAL -- -- --  CKMB -- -- --  CKMBINDEX -- -- --  TROPONINI 4.06* 5.46* 4.16*   Lipid Panel     Component Value Date/Time   CHOL 163 08/19/2012 0540   TRIG 141 08/19/2012 0540   HDL 28* 08/19/2012 0540   CHOLHDL 5.8 08/19/2012 0540   VLDL 28 08/19/2012 0540   LDLCALC 107* 08/19/2012 0540    Basename 08/18/12 1024  INR 1.04      Radiology: Dg Chest Portable 1 View 08/18/2012  *RADIOLOGY REPORT*  Clinical Data: Chest pain  PORTABLE CHEST - 1 VIEW  Comparison: None.  Findings: Shallow inspiration.  Normal heart size and pulmonary vascularity.  Linear atelectasis or infiltration in the lung bases. No blunting of costophrenic angles.  No pneumothorax.  Mediastinal contours appear intact.  IMPRESSION: Shallow inspiration with infiltration or atelectasis in both lung bases.   Original Report Authenticated By: Burman Nieves, M.D.    Cardiac Cath: 08/18/2012 Left mainstem: widely patent  Left anterior descending (LAD): Heavily diseased vessel with  ectasia and nonobstructive proximal plaque. The mid-vessel has diffuse 75% stenosis with irregularity and lesion hypodensity. The distal vessel is patent without disease. There is small D2 branch with total occlusion and contrast stain.  Left circumflex (LCx): Large vessel, Ramus intermedius has critical 99% stenosis in the mid portion and mild nonobstructive disease in the prox vessel. The AV groove circ and OM branches are patent with nonobstructive disease.  Right coronary artery (RCA): Dominant, diffuse irregularity but no significant  angiographic disease. PDA and PLA branches patent.  Left ventriculography: Left ventricular systolic function preserved with an LVEF of 55%. There is mild hypokinesis of the anterolateral wall.  PCI Data:  LESION 1  Vessel - Ramus/Segment - mid  Percent Stenosis (pre) 99  TIMI-flow 3  Stent 2.75x16 mm Promus DES  Percent Stenosis (post) 0  TIMI-flow (post) 3  LESION 2  Vessel - LAD/Segment - mid  Percent Stenosis (pre) 75  TIMI-flow 3  Stent 3.5x32 mm Promus DES  Percent Stenosis (post) 0  TIMI-flow (post) 3  EKG:  19-Aug-2012 07:04:58 [facility] ROUTINE RECORD Normal sinus rhythm Normal ECG Since last tracing rate faster 74mm/s 9mm/mV 100Hz  8.0.1 12SL 241 HD CID: 1 Referred by: Demetria Pore [place] Confirmed By: Valera Castle MD Vent. rate 70 BPM PR interval 168 ms QRS duration 82 ms QT/QTc 402/434 ms P-R-T axes 45 78 78  FOLLOW UP PLANS AND APPOINTMENTS No Known Allergies   Medication List     As of 08/19/2012  2:50 PM    TAKE these medications         aspirin 81 MG chewable tablet   Chew 1 tablet (81 mg total) by mouth daily.      atorvastatin 40 MG tablet   Commonly known as: LIPITOR   Take 1 tablet (40 mg total) by mouth daily.      esomeprazole 40 MG capsule   Commonly known as: NEXIUM   Take 40 mg by mouth daily before breakfast.      methylphenidate 27 MG CR tablet   Commonly known as: CONCERTA   Take 27 mg by mouth every morning.      metoprolol succinate 25 MG 24 hr tablet   Commonly known as: TOPROL-XL   Take 1 tablet (25 mg total) by mouth daily.      nitroGLYCERIN 0.4 MG SL tablet   Commonly known as: NITROSTAT   Place 1 tablet (0.4 mg total) under the tongue every 5 (five) minutes as needed for chest pain.      omega-3 acid ethyl esters 1 G capsule   Commonly known as: LOVAZA   Take 4 g by mouth daily.      OVER THE COUNTER MEDICATION   Take 4 capsules by mouth 2 (two) times daily. OTC. Food & vegetable Vitamin       prasugrel 10 MG Tabs   Commonly known as: EFFIENT   Take 1 tablet (10 mg total) by mouth daily.      TESTOPEL 75 MG Pllt   Generic drug: Testosterone   1 each by Implant route as directed. Patient states that he receives this medication every 5 month for low testosterone         Discharge Orders    Future Appointments: Provider: Department: Dept Phone: Center:   09/05/2012 10:00 AM Rosalio Macadamia, NP [facility] Main Office [place]) [phone] [email or web address]     Future Orders Please Complete By Expires   Amb Referral to Cardiac Rehabilitation  Diet - low sodium heart healthy      Increase activity slowly        Follow-up Information    Follow up with Norma Fredrickson, NP. On 09/05/2012. (See for Dr [place] at 10:00 am)    Contact information:   [address] [place] [address] [phone]          BRING ALL MEDICATIONS WITH YOU TO FOLLOW UP APPOINTMENTS  Time spent with patient to include physician time: 35 min Signed: Theodore Demark 08/19/2012, 2:50 PM Co-Sign MD

## 2012-08-19 NOTE — Progress Notes (Signed)
TELEMETRY: Reviewed telemetry pt in NSR, 7 beat run of slow NSVT: Filed Vitals:   08/18/12 1951 08/19/12 0038 08/19/12 0440 08/19/12 0559  BP: 120/76 109/69 127/77 125/75  Pulse: 72 63  73  Temp: 97.8 F (36.6 C) 97.8 F (36.6 C)  97.7 F (36.5 C)  TempSrc: Oral Oral  Oral  Resp: 18 12  19   Weight:  76.3 kg (168 lb 3.4 oz)    SpO2: 98% 97%  96%    Intake/Output Summary (Last 24 hours) at 08/19/12 0728 Last data filed at 08/19/12 0600  Gross per 24 hour  Intake    830 ml  Output   1475 ml  Net   -645 ml    SUBJECTIVE No further chest pain. Feels well.  LABS: Basic Metabolic Panel:  Basename 08/19/12 0540 08/18/12 0508  NA 137 137  K 4.0 3.9  CL 102 101  CO2 26 24  GLUCOSE 97 102*  BUN 12 15  CREATININE 0.94 0.86  CALCIUM 9.1 9.6  MG -- --  PHOS -- --   Liver Function Tests:  [facility] 08/19/12 0540  AST 37  ALT 22  ALKPHOS 47  BILITOT 0.4  PROT 6.7  ALBUMIN 3.6   No results found for this basename: LIPASE:2,AMYLASE:2 in the last 72 hours CBC:  Basename 08/19/12 0540 08/18/12 0508  WBC 13.1* 12.6*  NEUTROABS -- 6.1  HGB 16.6 15.9  HCT 47.4 44.9  MCV 94.8 95.5  PLT 227 236   Cardiac Enzymes:  Basename 08/19/12 0540 08/18/12 2252 08/18/12 1649  CKTOTAL -- -- --  CKMB -- -- --  CKMBINDEX -- -- --  TROPONINI 4.06* 5.46* 4.16*   Fasting Lipid Panel:  Basename 08/19/12 0540  CHOL 163  HDL 28*  LDLCALC 107*  TRIG 141  CHOLHDL 5.8  LDLDIRECT --   Thyroid Function Tests:  Basename 08/18/12 1644  TSH 1.564  T4TOTAL --  T3FREE --  THYROIDAB --    Radiology/Studies:  Dg Chest Portable 1 View  08/18/2012  *RADIOLOGY REPORT*  Clinical Data: Chest pain  PORTABLE CHEST - 1 VIEW  Comparison: None.  Findings: Shallow inspiration.  Normal heart size and pulmonary vascularity.  Linear atelectasis or infiltration in the lung bases. No blunting of costophrenic angles.  No pneumothorax.  Mediastinal contours appear intact.  IMPRESSION: Shallow  inspiration with infiltration or atelectasis in both lung bases.   Original Report Authenticated By: Burman Nieves, M.D.    Ecg: Normal.  PHYSICAL EXAM General: Well developed, well nourished, in no acute distress. Head: Normocephalic, atraumatic, sclera non-icteric, no xanthomas, nares are without discharge. Neck: Negative for carotid bruits. JVD not elevated. Lungs: Clear bilaterally to auscultation without wheezes, rales, or rhonchi. Breathing is unlabored. Heart: RRR S1 S2 without murmurs, rubs, or gallops.  Abdomen: Soft, non-tender, non-distended with normoactive bowel sounds. No hepatomegaly. No rebound/guarding. No obvious abdominal masses. Msk:  Strength and tone appears normal for age. Extremities: No clubbing, cyanosis or edema.  Distal pedal pulses are 2+ and equal bilaterally. No radial hematoma. Pulse 2+ Neuro: Alert and oriented X 3. Moves all extremities spontaneously. Psych:  Responds to questions appropriately with a normal affect.  ASSESSMENT AND PLAN: 1. NSTEMI s/p stenting of Proximal LAD and ramus intermediate branches with DES. Continue ASA and Effient form one year.  2. Dyslipidemia. LDL 107. Goal < 70. Will Rx with lipitor 40 mg daily. 3. NSVT asymptomatic. Will add Toprol XL 25 mg daily. Lytes OK.   Plan: discharge home today.  Follow up in 2 weeks. I recommend outpatient cardiac Rehab. Would prefer that he not work for one week. 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  Principal Problem:  *Non-ST elevation myocardial infarction (NSTEMI), initial care episode Active Problems:  GERD (gastroesophageal reflux disease)  Hyperlipidemia LDL goal < 70    Signed, Janece Laidlaw [place] MD,FACC 08/19/2012 7:34 AM

## 2012-08-19 NOTE — Progress Notes (Signed)
Pt had 6 bt v-tach, checked on pt and he was awake, asymptomatic, just finished using urinal.  BP 127/77.  BMET ordered for this am.

## 2012-08-19 NOTE — Progress Notes (Signed)
CARDIAC REHAB PHASE I   PRE:  Rate/Rhythm: 80 SR    BP: sitting 130/82    SaO2:   MODE:  Ambulation: 700 ft   POST:  Rate/Rhythm: 86    BP: sitting 136/85     SaO2:   Tolerated well except wheezy/SOB. He sts he has asthma. Ed completed with good reception. Voiced understanding and requests his name be sent to [facility].  [phone]  Melvin Martin CES, ACSM

## 2012-08-19 NOTE — Progress Notes (Signed)
Utilization Review Completed.   Kejuan Bekker, RN, BSN Nurse Case Manager  [phone]  

## 2012-08-19 NOTE — Discharge Summary (Signed)
Patient seen and examined and history reviewed. Agree with above findings and plan. See rounding note earlier today.   Melvin Martin 08/19/2012 3:29 PM

## 2012-08-25 ENCOUNTER — Telehealth: Payer: Self-pay | Admitting: Nurse Practitioner

## 2012-08-25 NOTE — Telephone Encounter (Signed)
New Problem:    Patient called in because the arm that his cath went through, right arm, is sore, prone to strains, and hurt more when he is laying down.  Patient would like to know if this is normal.  Please call back.

## 2012-08-26 NOTE — Telephone Encounter (Signed)
Patient called no answer.LMTC. 

## 2012-08-26 NOTE — Telephone Encounter (Signed)
Returned call to patient he stated he is having achy pain in arm where he had a cath.States worse at night when he lays down.States site slightly swollen.Fingers normal pink in color.Spoke to Dr.Jordan he advised to squeeze hands and fingers often this should help.Advised to keep appointment with Norma Fredrickson NP 09/05/12 and call back sooner if needed.

## 2012-09-01 ENCOUNTER — Emergency Department (INDEPENDENT_AMBULATORY_CARE_PROVIDER_SITE_OTHER): Payer: PRIVATE HEALTH INSURANCE

## 2012-09-01 ENCOUNTER — Encounter (HOSPITAL_COMMUNITY): Payer: Self-pay | Admitting: *Deleted

## 2012-09-01 ENCOUNTER — Emergency Department (INDEPENDENT_AMBULATORY_CARE_PROVIDER_SITE_OTHER)
Admission: EM | Admit: 2012-09-01 | Discharge: 2012-09-01 | Disposition: A | Discharge status: Home / Self Care | Payer: PRIVATE HEALTH INSURANCE | Source: Home / Self Care | Attending: Family Medicine | Admitting: Family Medicine

## 2012-09-01 DIAGNOSIS — J329 Chronic sinusitis, unspecified: Secondary | ICD-10-CM

## 2012-09-01 DIAGNOSIS — J069 Acute upper respiratory infection, unspecified: Secondary | ICD-10-CM

## 2012-09-01 DIAGNOSIS — J40 Bronchitis, not specified as acute or chronic: Secondary | ICD-10-CM

## 2012-09-01 LAB — INFLUENZA PANEL BY PCR (TYPE A & B): Influenza B By PCR: NEGATIVE

## 2012-09-01 MED ORDER — AMOXICILLIN 500 MG PO CAPS: 500.0000 mg | ORAL_CAPSULE | Freq: Three times a day (TID) | ORAL | Status: DC

## 2012-09-01 MED ORDER — HYDROCODONE-ACETAMINOPHEN 7.5-325 MG/15ML PO SOLN: 15.0000 mL | Freq: Three times a day (TID) | ORAL | Status: DC | PRN

## 2012-09-01 MED ORDER — PREDNISONE 20 MG PO TABS: ORAL_TABLET | ORAL | Status: DC

## 2012-09-01 MED ORDER — OSELTAMIVIR PHOSPHATE 75 MG PO CAPS: 75.0000 mg | ORAL_CAPSULE | Freq: Two times a day (BID) | ORAL | Status: DC

## 2012-09-01 NOTE — ED Notes (Signed)
Started with very slight sore throat on Monday; by Tues night started with nasal/head congestion, facial pressure and pressure in eyes, productive cough.  Coughing up clear sputum with tiny streaks of reddish-brown.  Denies any fevers.  Has been taking Mucinex D with some relief.  Has hx asthmatic bronchitis - feels like his breathing feels a little tighter than usual.  BBS clear, but expiratory wheeze noted when coughing during auscultation.

## 2012-09-01 NOTE — ED Provider Notes (Addendum)
History     CSN: [id number]  Arrival date & time 09/01/12  1118   First MD Initiated Contact with Patient 09/01/12 1343      Chief Complaint  Patient presents with  . Nasal Congestion  . Facial Pain    (Consider location/radiation/quality/duration/timing/severity/associated sxs/prior treatment) HPI Comments: 56 year old male with history of recurrent sinusitis and chronic bronchitis also status post cardiac stent after myocardial infarction 2 weeks ago. He's here complaining of nasal congestion sinus pressure and productive cough for 3 days. Reports headache, recurrent cough with intermittent wheezing but denies chest pain, fever, pleuritic pain, chills, vomiting, abdominal pain or diarrhea. Denies current shortness of breath. Patient reports he had a long history of chronic bronchitis although he has never been a smoker. He states "inhalers have never been helpful". Denies prior history of congestive heart failure. Appetite is at baseline. Patient is tolerating fluids fine.   Past Medical History  Diagnosis Date  . GERD (gastroesophageal reflux disease)   . Low testosterone   . ADD (attention deficit disorder)   . Hyperlipidemia LDL goal < 70   . Myocardial infarction     08/2012    Past Surgical History  Procedure Date  . Cyst removal, mouth   . Coronary angioplasty with stent placement     stent x 2    Family History  Problem Relation Age of Onset  . Emphysema Mother   . Heart failure Father   . Heart attack Father   . Heart attack Cousin     History  Substance Use Topics  . Smoking status: Never Smoker   . Smokeless tobacco: Never Used  . Alcohol Use: Yes      Review of Systems  Constitutional: Negative for fever, chills, diaphoresis, activity change, appetite change and fatigue.  HENT: Positive for congestion, sore throat, rhinorrhea and sinus pressure.   Eyes: Negative for discharge.  Respiratory: Positive for cough. Negative for shortness of breath.     Cardiovascular: Negative for chest pain and leg swelling.  Gastrointestinal: Negative for nausea, vomiting and diarrhea.  Musculoskeletal: Negative for myalgias and arthralgias.  Skin: Negative for rash.  Neurological: Negative for dizziness and headaches.  All other systems reviewed and are negative.    Allergies  Review of patient's allergies indicates no known allergies.  Home Medications   Current Outpatient Rx  Name  Route  Sig  Dispense  Refill  . VITAMIN C PO   Oral   Take by mouth.         . ASPIRIN 81 MG PO CHEW   Oral   Chew 1 tablet (81 mg total) by mouth daily.         . ATORVASTATIN CALCIUM 40 MG PO TABS   Oral   Take 1 tablet (40 mg total) by mouth daily.   30 tablet   11   . ESOMEPRAZOLE MAGNESIUM 40 MG PO CPDR   Oral   Take 40 mg by mouth daily before breakfast.         . METHYLPHENIDATE HCL ER 27 MG PO TBCR   Oral   Take 27 mg by mouth every morning.         Marland Kitchen METOPROLOL SUCCINATE ER 25 MG PO TB24   Oral   Take 1 tablet (25 mg total) by mouth daily.   30 tablet   11   . NITROGLYCERIN 0.4 MG SL SUBL   Sublingual   Place 1 tablet (0.4 mg total) under the tongue every 5 (  five) minutes as needed for chest pain.   25 tablet   3   . OMEGA-3-ACID ETHYL ESTERS 1 G PO CAPS   Oral   Take 4 g by mouth daily.         Marland Kitchen OVER THE COUNTER MEDICATION   Oral   Take 4 capsules by mouth 2 (two) times daily. OTC. Food & vegetable Vitamin         . PRASUGREL HCL 10 MG PO TABS   Oral   Take 1 tablet (10 mg total) by mouth daily.   30 tablet   0   . TESTOSTERONE 75 MG IL PLLT   Implant   1 each by Implant route as directed. Patient states that he receives this medication every 5 month for low testosterone         . AMOXICILLIN 500 MG PO CAPS   Oral   Take 1 capsule (500 mg total) by mouth 3 (three) times daily.   21 capsule   0   . HYDROCODONE-ACETAMINOPHEN 7.5-325 MG/15ML PO SOLN   Oral   Take 15 mLs by mouth every 8 (eight) hours  as needed for pain (or cough).   225 mL   0   . OSELTAMIVIR PHOSPHATE 75 MG PO CAPS   Oral   Take 1 capsule (75 mg total) by mouth every 12 (twelve) hours.   10 capsule   0   . PREDNISONE 20 MG PO TABS      2 tabs po daily for 5 days   10 tablet   0     BP 144/98  Pulse 83  Temp 98.3 F (36.8 C) (Oral)  Resp 18  SpO2 96%  Physical Exam  Nursing note and vitals reviewed. Constitutional: He is oriented to person, place, and time. He appears well-developed and well-nourished. No distress.  HENT:  Head: Normocephalic and atraumatic.       Nasal Congestion with erythema and swelling of nasal turbinates. Reported bilateral sinus maxillary tenderness.  mild pharyngeal erythema no exudates. No uvula deviation. No trismus. TM's with increased vascular markings, no some dullness bilaterally no swelling or bulging   Eyes: Conjunctivae normal are normal. Right eye exhibits no discharge. Left eye exhibits no discharge.  Neck: Neck supple. No JVD present.  Cardiovascular: Normal rate, regular rhythm and normal heart sounds.  Exam reveals no gallop and no friction rub.   No murmur heard. Pulmonary/Chest: Effort normal and breath sounds normal. No respiratory distress. He has no wheezes. He has no rales. He exhibits no tenderness.       Dry bronchitic cough on exam.  Lymphadenopathy:    He has no cervical adenopathy.  Neurological: He is alert and oriented to person, place, and time.  Skin: No rash noted. He is not diaphoretic.    ED Course  Procedures (including critical care time)   Labs Reviewed  INFLUENZA PANEL BY PCR  LAB REPORT - SCANNED   Dg Chest 2 View  09/01/2012  *RADIOLOGY REPORT*  Clinical Data: Cough, sinus infection, sore throat beginning on 08/29/2012  CHEST - 2 VIEW  Comparison: 08/18/2012  Findings: Hyperinflation suggests COPD.  The heart size and vascular pattern are normal.  There is evidence of left coronary stents.  There is no infiltrate or effusion.   Mild chronic scarring at the bases is stable.  IMPRESSION: No acute cardiopulmonary process. Improved inspiratory effect when compared to the prior study, with minimal bibasilar scarring.   Original Report Authenticated By: Marcy Salvo  Rubner, M.D.      1. URI (upper respiratory infection)   2. Sinusitis   3. Bronchitis       MDM  Clear lung examination. Was tested for influenza today. Decided to treat with prednisone and amoxicillin. X-ray was done and still not available for reading prior to discharge. We'll contact patient about chest x-rays results. Will contact patient if influenza test is positive and has a hold prescription for Tamiflu. Supportive care and red flags that should prompt his return to medical attention discussed with patient and provided in writing. Patient was asked to go to the emergency department if new or worsening symptoms like difficulty breathing or chest pain.  Talked to patient over the phone on 09/01/12 at about 7pm. Informed about negative influenza test. Also discussed chest Xrays results.  Asked to discard tamiflu prescription. Reccommended to discussed with PCP pulmonology referral for lung functional test.        Sharin Grave, MD 09/02/12 1011  Ahnesty Finfrock Moreno-Coll, MD 09/02/12 1012

## 2012-09-01 NOTE — ED Notes (Signed)
Denies hx HTN, "but my blood pressure always goes up when I'm sick".  Had MI with 2 stents placed 2 wks ago.

## 2012-09-02 ENCOUNTER — Telehealth: Payer: Self-pay | Admitting: Nurse Practitioner

## 2012-09-02 NOTE — Telephone Encounter (Signed)
Received FMLA from [facility] [facility] [employer]. Lawson Fiscal is out of the office. Will hold in the HIM dept until she returns to the office on 12.30.13...djc

## 2012-09-05 ENCOUNTER — Encounter: Payer: Self-pay | Admitting: Nurse Practitioner

## 2012-09-05 ENCOUNTER — Ambulatory Visit (INDEPENDENT_AMBULATORY_CARE_PROVIDER_SITE_OTHER): Payer: PRIVATE HEALTH INSURANCE | Admitting: Nurse Practitioner

## 2012-09-05 VITALS — BP 122/80 | HR 72 | Ht 70.0 in | Wt 172.1 lb

## 2012-09-05 DIAGNOSIS — I214 Non-ST elevation (NSTEMI) myocardial infarction: Secondary | ICD-10-CM

## 2012-09-05 NOTE — Progress Notes (Addendum)
Melvin Martin Date of Birth: [birth year] Medical Record #[id number]  History of Present Illness: Melvin Martin is seen back today for a post hospital visit. He is seen for Melvin Martin. He has had a recent NSTEMI with PCI that included DES to the ramus and LAD. On Effient for one year. His other issues include GERD and HLD.  He comes in today. He is here alone. He is doing ok. Has had a flare up of his asthmatic bronchitis following a head cold. No cardiac complaints. Energy level seems a little off. No chest pain. Not short of breath. Tolerating his medicines for the most part except for the fatigue.   Current Outpatient Prescriptions on File Prior to Visit  Medication Sig Dispense Refill  . amoxicillin (AMOXIL) 500 MG capsule Take 1 capsule (500 mg total) by mouth 3 (three) times daily.  21 capsule  0  . Ascorbic Acid (VITAMIN C PO) Take by mouth.      Marland Kitchen aspirin 81 MG chewable tablet Chew 1 tablet (81 mg total) by mouth daily.      Marland Kitchen atorvastatin (LIPITOR) 40 MG tablet Take 1 tablet (40 mg total) by mouth daily.  30 tablet  11  . esomeprazole (NEXIUM) 40 MG capsule Take 40 mg by mouth daily before breakfast.      . hydrocodone-acetaminophen (HYCET) 7.5-325 MG/15ML solution Take 15 mLs by mouth every 8 (eight) hours as needed for pain (or cough).  225 mL  0  . methylphenidate (CONCERTA) 27 MG CR tablet Take 27 mg by mouth every morning.      . metoprolol succinate (TOPROL XL) 25 MG 24 hr tablet Take 1 tablet (25 mg total) by mouth daily.  30 tablet  11  . nitroGLYCERIN (NITROSTAT) 0.4 MG SL tablet Place 1 tablet (0.4 mg total) under the tongue every 5 (five) minutes as needed for chest pain.  25 tablet  3  . omega-3 acid ethyl esters (LOVAZA) 1 G capsule Take 4 g by mouth daily.      Marland Kitchen OVER THE COUNTER MEDICATION Take 4 capsules by mouth 2 (two) times daily. OTC. Food & vegetable Vitamin      . prasugrel (EFFIENT) 10 MG TABS Take 1 tablet (10 mg total) by mouth daily.  30 tablet  0  . predniSONE  (DELTASONE) 20 MG tablet 2 tabs po daily for 5 days  10 tablet  0  . SIMPLY SALINE NA Place into the nose 3 (three) times daily.      . Testosterone (TESTOPEL) 75 MG PLLT 1 each by Implant route as directed. Patient states that he receives this medication every 5 month for low testosterone        No Known Allergies  Past Medical History  Diagnosis Date  . GERD (gastroesophageal reflux disease)   . Low testosterone   . ADD (attention deficit disorder)   . Hyperlipidemia LDL goal < 70   . NSTEMI (non-ST elevated myocardial infarction) December 2013    with PCI with DES to the ramus and LAD  . Asthmatic bronchitis     Past Surgical History  Procedure Date  . Cyst removal, mouth   . Coronary angioplasty with stent placement 08/18/2012    DES to the ramus and LAD    History  Smoking status  . Never Smoker   Smokeless tobacco  . Never Used    History  Alcohol Use  . Yes    Family History  Problem Relation Age of Onset  .  Emphysema Mother   . Heart failure Father   . Heart attack Father   . Heart attack Cousin     Review of Systems: The review of systems is per the HPI.  All other systems were reviewed and are negative.  Physical Exam: BP 122/80  Pulse 72  Ht 5\' 10"  (1.778 m)  Wt 172 lb 1.9 oz (78.073 kg)  BMI 24.70 kg/m2 Patient is very pleasant and in no acute distress. Skin is warm and dry. Color is normal.  HEENT is unremarkable. Normocephalic/atraumatic. PERRL. Sclera are nonicteric. Neck is supple. No masses. No JVD. Lungs are clear. Cardiac exam shows a regular rate and rhythm. Abdomen is soft. Extremities are without edema. Gait and ROM are intact. No gross neurologic deficits noted.   LABORATORY DATA: Lab Results  Component Value Date   WBC 13.1* 08/19/2012   HGB 16.6 08/19/2012   HCT 47.4 08/19/2012   PLT 227 08/19/2012   GLUCOSE 97 08/19/2012   CHOL 163 08/19/2012   TRIG 141 08/19/2012   HDL 28* 08/19/2012   LDLCALC 107* 08/19/2012   ALT 22  08/19/2012   AST 37 08/19/2012   NA 137 08/19/2012   K 4.0 08/19/2012   CL 102 08/19/2012   CREATININE 0.94 08/19/2012   BUN 12 08/19/2012   CO2 26 08/19/2012   TSH 1.564 08/18/2012   INR 1.04 08/18/2012    Lab Results  Component Value Date   TROPONINI 4.06* 08/19/2012    CARDIAC CATH   Coronary angiography:   Coronary dominance: right  Left mainstem: widely patent  Left anterior descending (LAD): Heavily diseased vessel with ectasia and nonobstructive proximal plaque. The mid-vessel has diffuse 75% stenosis with irregularity and lesion hypodensity. The distal vessel is patent without disease. There is small D2 branch with total occlusion and contrast stain.  Left circumflex (LCx): Large vessel, Ramus intermedius has critical 99% stenosis in the mid portion and mild nonobstructive disease in the prox vessel. The AV groove circ and OM branches are patent with nonobstructive disease.  Right coronary artery (RCA): Dominant, diffuse irregularity but no significant angiographic disease. PDA and PLA branches patent.  Left ventriculography: Left ventricular systolic function preserved with an LVEF of 55%. There is mild hypokinesis of the anterolateral wall.   PCI Note: Following the diagnostic procedure, the decision was made to proceed with PCI. There was critical stenosis of the ramus branch and severe stenosis with hypodensity and irregularity in the mid-LAD. The radial sheath was upsized to a 6 [place]. Weight-based bivalirudin was given for anticoagulation. Effient 60 mg was administered. Once a therapeutic ACT was achieved, a 6 [place] XB-LAD 3.5 cm guide catheter was inserted. A BMW coronary guidewire was used to cross the lesion in the Ramus. The lesion was predilated with a 2.5x15 mm balloon. The lesion was then stented with a 2.75 x 16 mm Promus Premier DES stent. The stent was postdilated with a 3.0 x 12 mm noncompliant balloon. Following PCI, there was 0% residual stenosis and TIMI-3  flow. Attention was then turned to the LAD. The vessel was wired with the same BMW wire. A 3.75x20 mm [place] balloon was used to measure the length of the lesion (it was not dilated). The lesion was then stented with a 3.5x32 mm Promus Premier DES. The stent was post-dilated with the 3.75 [place] balloon to high pressure. Final angiography confirmed an excellent result with TIMI-3 flow and 0% residual stenosis. The patient tolerated the procedure well. There were no immediate procedural  complications. A TR band was used for radial hemostasis. The patient was transferred to the post catheterization recovery area for further monitoring.   PCI Data:  LESION 1  Vessel - Ramus/Segment - mid  Percent Stenosis (pre) 99  TIMI-flow 3  Stent 2.75x16 mm Promus DES  Percent Stenosis (post) 0  TIMI-flow (post) 3  LESION 2  Vessel - LAD/Segment - mid  Percent Stenosis (pre) 75  TIMI-flow 3  Stent 3.5x32 mm Promus DES  Percent Stenosis (post) 0  TIMI-flow (post) 3   Final Conclusions:  1. 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 Nonobstructive RCA stenosis  3. Mild LV contraction abnormality with overall normal LVEF 55%   Recommendations:  DAPT with ASA and effient x 12 months, aggressive risk modification.  Tonny Bollman  08/18/2012, 6:22 PM   Assessment / Plan: 1. NSTEMI - with PCI with DES to LAD and ramus. He is doing well. Has had to postpone his starting of rehab due to his asthmatic bronchitis. They will be calling to reschedule. We will see him back in 4 weeks.  2. HLD - on statin therapy. Recheck labs on return. Could consider Niaspan in the future.   3. ADD - he is on Concerta. Would like to use prn Ritalin. I have asked him to hold off for now.  Call the [facility] office at [phone] if you have any questions, problems or concerns.

## 2012-09-05 NOTE — Patient Instructions (Addendum)
I think you are doing well.  Stay on your current medicines  We will see you back in 1 month with fasting labs  Call the [facility] office at [phone] if you have any questions, problems or concerns.

## 2012-09-12 ENCOUNTER — Telehealth: Payer: Self-pay | Admitting: Cardiology

## 2012-09-12 NOTE — Telephone Encounter (Signed)
New problem:   Dentist appt on tomorrow. H/o Aug 18, 2012. Please advise on restrictions & pre- meds. Or should he delay his dental appt for a few months .

## 2012-09-12 NOTE — Telephone Encounter (Signed)
Patient called stated he is having teeth cleaned tomorrow 09/13/12,wanting to know if he needs any antibiotics.Patient was told will check with Dr.Jordan in the morning and call him back.

## 2012-09-13 NOTE — Telephone Encounter (Signed)
Patient called was told spoke to Dr.Jordan he advised no antibiotics needed for dental cleaning.

## 2012-10-03 ENCOUNTER — Ambulatory Visit (INDEPENDENT_AMBULATORY_CARE_PROVIDER_SITE_OTHER): Payer: PRIVATE HEALTH INSURANCE | Admitting: Nurse Practitioner

## 2012-10-03 ENCOUNTER — Encounter: Payer: Self-pay | Admitting: Nurse Practitioner

## 2012-10-03 VITALS — BP 118/72 | HR 72 | Ht 70.0 in | Wt 168.8 lb

## 2012-10-03 DIAGNOSIS — I259 Chronic ischemic heart disease, unspecified: Secondary | ICD-10-CM

## 2012-10-03 LAB — BASIC METABOLIC PANEL
BUN: 22 mg/dL (ref 6–23)
CO2: 30 mEq/L (ref 19–32)
Calcium: 9 mg/dL (ref 8.4–10.5)
Chloride: 104 mEq/L (ref 96–112)
Creatinine, Ser: 0.9 mg/dL (ref 0.4–1.5)
GFR: 90.97 mL/min (ref >60.00)
Glucose, Bld: 100 mg/dL — ABNORMAL HIGH (ref 70–99)
Potassium: 3.8 mEq/L (ref 3.5–5.1)
Sodium: 140 mEq/L (ref 135–145)

## 2012-10-03 LAB — HEPATIC FUNCTION PANEL
ALT: 47 U/L (ref 0–53)
AST: 24 U/L (ref 0–37)
Albumin: 3.8 g/dL (ref 3.5–5.2)
Alkaline Phosphatase: 44 U/L (ref 39–117)
Bilirubin, Direct: 0.1 mg/dL (ref 0.0–0.3)
Total Bilirubin: 1.1 mg/dL (ref 0.3–1.2)
Total Protein: 6.7 g/dL (ref 6.0–8.3)

## 2012-10-03 LAB — LIPID PANEL
Cholesterol: 86 mg/dL (ref 0–200)
HDL: 28.1 mg/dL — ABNORMAL LOW (ref >39.00)
LDL Cholesterol: 42 mg/dL (ref 0–99)
Total CHOL/HDL Ratio: 3
Triglycerides: 80 mg/dL (ref 0.0–149.0)
VLDL: 16 mg/dL (ref 0.0–40.0)

## 2012-10-03 MED ORDER — PRASUGREL HCL 10 MG PO TABS: 10.0000 mg | ORAL_TABLET | Freq: Every day | ORAL | Status: DC

## 2012-10-03 NOTE — Progress Notes (Signed)
Melvin Martin Date of Birth: [birth year] Medical Record #[id number]  History of Present Illness: Melvin Martin is seen back today for a one month check. He is seen for Dr. [place]. In December he has had a NSTEMI with PCI that included DES to the ramus and the LAD. On Effient for one year. Other issues include GERD and HLD.   He comes in today. He is here alone. He is doing ok. No chest pain. Not short of breath. Tolerating his medicines. Starting cardiac rehab this week. Under some extra stress with family and work issues.   Current Outpatient Prescriptions on File Prior to Visit  Medication Sig Dispense Refill  . amoxicillin (AMOXIL) 500 MG capsule Take 1 capsule (500 mg total) by mouth 3 (three) times daily.  21 capsule  0  . Ascorbic Acid (VITAMIN C PO) Take by mouth.      Marland Kitchen aspirin 81 MG chewable tablet Chew 1 tablet (81 mg total) by mouth daily.      Marland Kitchen atorvastatin (LIPITOR) 40 MG tablet Take 1 tablet (40 mg total) by mouth daily.  30 tablet  11  . esomeprazole (NEXIUM) 40 MG capsule Take 40 mg by mouth daily before breakfast.      . hydrocodone-acetaminophen (HYCET) 7.5-325 MG/15ML solution Take 15 mLs by mouth every 8 (eight) hours as needed for pain (or cough).  225 mL  0  . methylphenidate (CONCERTA) 27 MG CR tablet Take 27 mg by mouth every morning.      . metoprolol succinate (TOPROL XL) 25 MG 24 hr tablet Take 1 tablet (25 mg total) by mouth daily.  30 tablet  11  . nitroGLYCERIN (NITROSTAT) 0.4 MG SL tablet Place 1 tablet (0.4 mg total) under the tongue every 5 (five) minutes as needed for chest pain.  25 tablet  3  . omega-3 acid ethyl esters (LOVAZA) 1 G capsule Take 4 g by mouth daily.      Marland Kitchen OVER THE COUNTER MEDICATION Take 4 capsules by mouth 2 (two) times daily. OTC. Food & vegetable Vitamin      . prasugrel (EFFIENT) 10 MG TABS Take 1 tablet (10 mg total) by mouth daily.  30 tablet  0  . predniSONE (DELTASONE) 20 MG tablet 2 tabs po daily for 5 days  10 tablet  0  . SIMPLY  SALINE NA Place into the nose 3 (three) times daily.      . Testosterone (TESTOPEL) 75 MG PLLT 1 each by Implant route as directed. Patient states that he receives this medication every 5 month for low testosterone        No Known Allergies  Past Medical History  Diagnosis Date  . GERD (gastroesophageal reflux disease)   . Low testosterone   . ADD (attention deficit disorder)   . Hyperlipidemia LDL goal < 70   . NSTEMI (non-ST elevated myocardial infarction) December 2013    with PCI with DES to the ramus and LAD  . Asthmatic bronchitis     Past Surgical History  Procedure Date  . Cyst removal, mouth   . Coronary angioplasty with stent placement 08/18/2012    DES to the ramus and LAD    History  Smoking status  . Never Smoker   Smokeless tobacco  . Never Used    History  Alcohol Use  . Yes    Family History  Problem Relation Age of Onset  . Emphysema Mother   . Heart failure Father   . Heart  attack Father   . Heart attack Cousin     Review of Systems: The review of systems is per the HPI.  All other systems were reviewed and are negative.  Physical Exam: There were no vitals taken for this visit. Patient is very pleasant and in no acute distress. Skin is warm and dry. Color is normal.  HEENT is unremarkable. Normocephalic/atraumatic. PERRL. Sclera are nonicteric. Neck is supple. No masses. No JVD. Lungs are clear. Cardiac exam shows a regular rate and rhythm. Abdomen is soft. Extremities are without edema. Gait and ROM are intact. No gross neurologic deficits noted.   LABORATORY DATA: Fasting labs are pending  Lab Results  Component Value Date   WBC 13.1* 08/19/2012   HGB 16.6 08/19/2012   HCT 47.4 08/19/2012   PLT 227 08/19/2012   GLUCOSE 97 08/19/2012   CHOL 163 08/19/2012   TRIG 141 08/19/2012   HDL 28* 08/19/2012   LDLCALC 107* 08/19/2012   ALT 22 08/19/2012   AST 37 08/19/2012   NA 137 08/19/2012   K 4.0 08/19/2012   CL 102 08/19/2012    CREATININE 0.94 08/19/2012   BUN 12 08/19/2012   CO2 26 08/19/2012   TSH 1.564 08/18/2012   INR 1.04 08/18/2012    Assessment / Plan:  1. CAD with recent NSTEMI with PCI that included DES to the ramus and LAD - on Effient/Aspirin. He is doing well. He is ok to start cardiac rehab.   2. HLD - need to check labs today.  Patient is agreeable to this plan and will call if any problems develop in the interim.

## 2012-10-03 NOTE — Patient Instructions (Addendum)
I think you are doing well  Stay on your current medicines  Ok to start cardiac rehab this week  We will check fasting labs today (BMET/HPF/Lipids)  See Dr. Swaziland in 3 months  Call the [facility] office at [phone] if you have any questions, problems or concerns.

## 2012-10-04 ENCOUNTER — Telehealth: Payer: Self-pay | Admitting: *Deleted

## 2012-10-04 NOTE — Telephone Encounter (Signed)
t/w pt states exercise does not change your cholestral

## 2012-10-05 ENCOUNTER — Ambulatory Visit (HOSPITAL_COMMUNITY): Payer: PRIVATE HEALTH INSURANCE

## 2012-10-10 ENCOUNTER — Encounter (HOSPITAL_COMMUNITY): Payer: PRIVATE HEALTH INSURANCE

## 2012-10-12 ENCOUNTER — Encounter (HOSPITAL_COMMUNITY): Payer: PRIVATE HEALTH INSURANCE

## 2012-10-12 ENCOUNTER — Inpatient Hospital Stay (HOSPITAL_COMMUNITY)
Admission: RE | Admit: 2012-10-12 | Discharge: 2012-10-12 | Disposition: A | Discharge status: Home / Self Care | Payer: PRIVATE HEALTH INSURANCE | Source: Ambulatory Visit

## 2012-10-14 ENCOUNTER — Encounter (HOSPITAL_COMMUNITY): Payer: PRIVATE HEALTH INSURANCE

## 2012-10-17 ENCOUNTER — Encounter (HOSPITAL_COMMUNITY)
Admission: RE | Admit: 2012-10-17 | Discharge: 2012-10-17 | Disposition: A | Discharge status: Home / Self Care | Payer: PRIVATE HEALTH INSURANCE | Source: Ambulatory Visit | Attending: Cardiology | Admitting: Cardiology

## 2012-10-17 DIAGNOSIS — I214 Non-ST elevation (NSTEMI) myocardial infarction: Secondary | ICD-10-CM | POA: Insufficient documentation

## 2012-10-17 DIAGNOSIS — Z5189 Encounter for other specified aftercare: Secondary | ICD-10-CM | POA: Insufficient documentation

## 2012-10-17 NOTE — Progress Notes (Signed)
Pt started cardiac rehab today in the 1115 exercise class. Pt tolerated light exercise without difficulty. Monitor showed sr with tall twaves noted.  Monitor strip is similar to 12 lead ekg from 08/19/12.  Will continue to monitor.

## 2012-10-19 ENCOUNTER — Encounter (HOSPITAL_COMMUNITY)
Admission: RE | Admit: 2012-10-19 | Discharge: 2012-10-19 | Disposition: A | Discharge status: Home / Self Care | Payer: PRIVATE HEALTH INSURANCE | Source: Ambulatory Visit | Attending: Cardiology | Admitting: Cardiology

## 2012-10-21 ENCOUNTER — Encounter (HOSPITAL_COMMUNITY): Payer: PRIVATE HEALTH INSURANCE

## 2012-10-22 ENCOUNTER — Other Ambulatory Visit: Payer: Self-pay

## 2012-10-24 ENCOUNTER — Encounter (HOSPITAL_COMMUNITY): Payer: PRIVATE HEALTH INSURANCE

## 2012-10-24 ENCOUNTER — Encounter: Payer: Self-pay | Admitting: Nurse Practitioner

## 2012-10-24 ENCOUNTER — Telehealth: Payer: Self-pay | Admitting: Cardiology

## 2012-10-24 NOTE — Telephone Encounter (Signed)
Pt calling re sob, pls call

## 2012-10-24 NOTE — Telephone Encounter (Signed)
Spoke with patient he stated he has been sob for 2 days or more.States he has no chest pain,no swelling.States he has a history of bronchitis and wanted to know if he could stop metoprolol.States B/P today 125/83 Pulse 79 O2 sat 95%.Spoke with Dr.Jordan he advised may stop metoprolol,monitor blood pressure.Advised to call back if continues to have sob.

## 2012-10-26 ENCOUNTER — Encounter (HOSPITAL_COMMUNITY): Admission: RE | Admit: 2012-10-26 | Payer: PRIVATE HEALTH INSURANCE | Source: Ambulatory Visit

## 2012-10-26 ENCOUNTER — Encounter: Payer: Self-pay | Admitting: Nurse Practitioner

## 2012-10-28 ENCOUNTER — Telehealth (HOSPITAL_COMMUNITY): Payer: Self-pay | Admitting: *Deleted

## 2012-10-28 ENCOUNTER — Encounter (HOSPITAL_COMMUNITY): Payer: PRIVATE HEALTH INSURANCE

## 2012-10-28 NOTE — Telephone Encounter (Signed)
Message left on department voice mail.  Pt with continued absence due to work conflicts.  Unable to get to exercise.

## 2012-10-31 ENCOUNTER — Encounter (HOSPITAL_COMMUNITY)
Admission: RE | Admit: 2012-10-31 | Discharge: 2012-10-31 | Disposition: A | Discharge status: Home / Self Care | Payer: PRIVATE HEALTH INSURANCE | Source: Ambulatory Visit | Attending: Cardiology | Admitting: Cardiology

## 2012-10-31 NOTE — Progress Notes (Signed)
Pt returned to exercise.  Pt with absences last week due to work schedule.  Pt will aim to attend Mondays and Fridays work permitted.

## 2012-10-31 NOTE — Progress Notes (Signed)
Pt returned to exercise today and is off beta blocker.  Pt reports feeling much better as a result.  Reviewed home exercise with pt today.  Pt plans to walk and use Wii Fit at home for exercise.  Reviewed THR, pulse, RPE, sign and symptoms, NTG use, and when to call 911 or MD.  Pt voiced understanding. Fabio Pierce, MA, ACSM RCEP

## 2012-11-02 ENCOUNTER — Encounter (HOSPITAL_COMMUNITY): Payer: PRIVATE HEALTH INSURANCE

## 2012-11-04 ENCOUNTER — Encounter (HOSPITAL_COMMUNITY)
Admission: RE | Admit: 2012-11-04 | Discharge: 2012-11-04 | Disposition: A | Discharge status: Home / Self Care | Payer: PRIVATE HEALTH INSURANCE | Source: Ambulatory Visit | Attending: Cardiology | Admitting: Cardiology

## 2012-11-04 NOTE — Progress Notes (Signed)
Melvin Martin wants to change to the 1:15 class it will work better for his schedule.

## 2012-11-07 ENCOUNTER — Encounter (HOSPITAL_COMMUNITY): Admission: RE | Admit: 2012-11-07 | Payer: PRIVATE HEALTH INSURANCE | Source: Ambulatory Visit

## 2012-11-07 ENCOUNTER — Telehealth (HOSPITAL_COMMUNITY): Payer: Self-pay | Admitting: Cardiac Rehabilitation

## 2012-11-07 ENCOUNTER — Encounter (HOSPITAL_COMMUNITY): Payer: PRIVATE HEALTH INSURANCE

## 2012-11-07 NOTE — Telephone Encounter (Signed)
Message received from pt he will be absent from cardiac rehab for inclement weather.

## 2012-11-09 ENCOUNTER — Encounter (HOSPITAL_COMMUNITY): Payer: PRIVATE HEALTH INSURANCE

## 2012-11-09 ENCOUNTER — Encounter (HOSPITAL_COMMUNITY): Payer: PRIVATE HEALTH INSURANCE | Attending: Cardiology

## 2012-11-09 DIAGNOSIS — Z5189 Encounter for other specified aftercare: Secondary | ICD-10-CM | POA: Insufficient documentation

## 2012-11-09 DIAGNOSIS — I214 Non-ST elevation (NSTEMI) myocardial infarction: Secondary | ICD-10-CM | POA: Insufficient documentation

## 2012-11-11 ENCOUNTER — Encounter (HOSPITAL_COMMUNITY): Payer: PRIVATE HEALTH INSURANCE

## 2012-11-14 ENCOUNTER — Encounter (HOSPITAL_COMMUNITY): Payer: PRIVATE HEALTH INSURANCE

## 2012-11-14 ENCOUNTER — Encounter (HOSPITAL_COMMUNITY): Admission: RE | Admit: 2012-11-14 | Payer: PRIVATE HEALTH INSURANCE | Source: Ambulatory Visit

## 2012-11-16 ENCOUNTER — Encounter (HOSPITAL_COMMUNITY): Payer: PRIVATE HEALTH INSURANCE

## 2012-11-18 ENCOUNTER — Encounter (HOSPITAL_COMMUNITY): Payer: PRIVATE HEALTH INSURANCE

## 2012-11-21 ENCOUNTER — Encounter (HOSPITAL_COMMUNITY): Payer: PRIVATE HEALTH INSURANCE

## 2012-11-23 ENCOUNTER — Encounter (HOSPITAL_COMMUNITY): Payer: PRIVATE HEALTH INSURANCE

## 2012-11-25 ENCOUNTER — Encounter (HOSPITAL_COMMUNITY): Payer: PRIVATE HEALTH INSURANCE

## 2012-11-27 ENCOUNTER — Emergency Department (HOSPITAL_COMMUNITY): Payer: PRIVATE HEALTH INSURANCE

## 2012-11-27 ENCOUNTER — Emergency Department (HOSPITAL_COMMUNITY)
Admission: EM | Admit: 2012-11-27 | Discharge: 2012-11-28 | Disposition: A | Discharge status: Home / Self Care | Payer: PRIVATE HEALTH INSURANCE | Attending: Emergency Medicine | Admitting: Emergency Medicine

## 2012-11-27 ENCOUNTER — Encounter (HOSPITAL_COMMUNITY): Payer: Self-pay | Admitting: Emergency Medicine

## 2012-11-27 DIAGNOSIS — E785 Hyperlipidemia, unspecified: Secondary | ICD-10-CM | POA: Insufficient documentation

## 2012-11-27 DIAGNOSIS — Z9861 Coronary angioplasty status: Secondary | ICD-10-CM | POA: Insufficient documentation

## 2012-11-27 DIAGNOSIS — R42 Dizziness and giddiness: Secondary | ICD-10-CM | POA: Insufficient documentation

## 2012-11-27 DIAGNOSIS — R079 Chest pain, unspecified: Secondary | ICD-10-CM | POA: Insufficient documentation

## 2012-11-27 DIAGNOSIS — D72829 Elevated white blood cell count, unspecified: Secondary | ICD-10-CM | POA: Insufficient documentation

## 2012-11-27 DIAGNOSIS — R531 Weakness: Secondary | ICD-10-CM

## 2012-11-27 DIAGNOSIS — R110 Nausea: Secondary | ICD-10-CM | POA: Insufficient documentation

## 2012-11-27 DIAGNOSIS — J45909 Unspecified asthma, uncomplicated: Secondary | ICD-10-CM | POA: Insufficient documentation

## 2012-11-27 DIAGNOSIS — E291 Testicular hypofunction: Secondary | ICD-10-CM | POA: Insufficient documentation

## 2012-11-27 DIAGNOSIS — K219 Gastro-esophageal reflux disease without esophagitis: Secondary | ICD-10-CM | POA: Insufficient documentation

## 2012-11-27 DIAGNOSIS — R5381 Other malaise: Secondary | ICD-10-CM | POA: Insufficient documentation

## 2012-11-27 DIAGNOSIS — R5383 Other fatigue: Secondary | ICD-10-CM | POA: Insufficient documentation

## 2012-11-27 DIAGNOSIS — Z79899 Other long term (current) drug therapy: Secondary | ICD-10-CM | POA: Insufficient documentation

## 2012-11-27 DIAGNOSIS — I2510 Atherosclerotic heart disease of native coronary artery without angina pectoris: Secondary | ICD-10-CM | POA: Insufficient documentation

## 2012-11-27 DIAGNOSIS — Z7982 Long term (current) use of aspirin: Secondary | ICD-10-CM | POA: Insufficient documentation

## 2012-11-27 DIAGNOSIS — F988 Other specified behavioral and emotional disorders with onset usually occurring in childhood and adolescence: Secondary | ICD-10-CM | POA: Insufficient documentation

## 2012-11-27 DIAGNOSIS — I252 Old myocardial infarction: Secondary | ICD-10-CM | POA: Insufficient documentation

## 2012-11-27 HISTORY — DX: Atherosclerotic heart disease of native coronary artery without angina pectoris: I25.10

## 2012-11-27 LAB — COMPREHENSIVE METABOLIC PANEL
Albumin: 3.4 g/dL — ABNORMAL LOW (ref 3.5–5.2)
BUN: 17 mg/dL (ref 6–23)
Chloride: 103 mEq/L (ref 96–112)
Creatinine, Ser: 0.99 mg/dL (ref 0.50–1.35)
GFR calc Af Amer: >90 mL/min (ref >90)
Glucose, Bld: 89 mg/dL (ref 70–99)
Total Bilirubin: 0.5 mg/dL (ref 0.3–1.2)

## 2012-11-27 LAB — CBC WITH DIFFERENTIAL/PLATELET
Basophils Relative: 0 % (ref 0–1)
Eosinophils Absolute: 0.3 10*3/uL (ref 0.0–0.7)
HCT: 44.1 % (ref 39.0–52.0)
Hemoglobin: 15.5 g/dL (ref 13.0–17.0)
MCH: 33.8 pg (ref 26.0–34.0)
MCHC: 35.1 g/dL (ref 30.0–36.0)
Monocytes Absolute: 1 10*3/uL (ref 0.1–1.0)
Monocytes Relative: 5 % (ref 3–12)
Neutro Abs: 15 10*3/uL — ABNORMAL HIGH (ref 1.7–7.7)

## 2012-11-27 LAB — TROPONIN I: Troponin I: <0.3 ng/mL (ref <0.30)

## 2012-11-27 MED ORDER — ONDANSETRON HCL 4 MG/2ML IJ SOLN
4.0000 mg | Freq: Once | INTRAMUSCULAR | Status: AC
  Administered 2012-11-27: 4 mg via INTRAVENOUS
  Filled 2012-11-27: qty 2

## 2012-11-27 MED ORDER — ONDANSETRON 8 MG PO TBDP: 8.0000 mg | ORAL_TABLET | Freq: Three times a day (TID) | ORAL | Status: DC | PRN

## 2012-11-27 MED ORDER — SODIUM CHLORIDE 0.9 % IV BOLUS (SEPSIS)
1000.0000 mL | Freq: Once | INTRAVENOUS | Status: AC
  Administered 2012-11-27: 1000 mL via INTRAVENOUS

## 2012-11-27 NOTE — ED Notes (Signed)
PT. ARRIVED WITH EMS FROM HOME REPORTS MID CHEST PAIN /PRESSURE WITH NAUSEA ONSET TODAY RECEIVED 4 BABY ASA AND 3 NTG SL PTA WITH SLIGHT RELIEF , ALSO REPORTED TAKEN PEPTO BISMOL /NEXIUM FOR GASTRIC REFLUX TODAY . RATES PAIN 2/10 AT ARRIVAL.

## 2012-11-27 NOTE — ED Notes (Signed)
Patient transported to X-ray 

## 2012-11-27 NOTE — ED Provider Notes (Signed)
History     CSN: [id number]  Arrival date & time 11/27/12  1911   First MD Initiated Contact with Patient 11/27/12 1916      Chief Complaint  Patient presents with  . Chest Pain    (Consider location/radiation/quality/duration/timing/severity/associated sxs/prior treatment) HPI Melvin Martin is a 57 y.o. male who presents to ED with complaint of weakness, nausea, and some chest discomfort. States symptoms began this afternoon. States took one nitro and later some nexium and pepcid with no improvement. States hx of NSTEMI and stenting 3 months ago. No problems since. States no improvement with nitro earlier. States also took 4 81mg  aspirins prior to coming. Denies vomiting, URI symptoms, abdominal pain. Does not feel like last NSTEMI.    Past Medical History  Diagnosis Date  . GERD (gastroesophageal reflux disease)   . Low testosterone   . ADD (attention deficit disorder)   . Hyperlipidemia LDL goal < 70   . NSTEMI (non-ST elevated myocardial infarction) December 2013    with PCI with DES to the ramus and LAD  . Asthmatic bronchitis   . Coronary artery disease     Past Surgical History  Procedure Laterality Date  . Cyst removal, mouth    . Coronary angioplasty with stent placement  08/18/2012    DES to the ramus and LAD    Family History  Problem Relation Age of Onset  . Emphysema Mother   . Heart failure Father   . Heart attack Father   . Heart attack Cousin     History  Substance Use Topics  . Smoking status: Never Smoker   . Smokeless tobacco: Never Used  . Alcohol Use: Yes      Review of Systems  Constitutional: Negative for fever and chills.  HENT: Negative for neck pain and neck stiffness.   Respiratory: Positive for chest tightness. Negative for cough and shortness of breath.   Cardiovascular: Positive for chest pain. Negative for palpitations and leg swelling.  Gastrointestinal: Positive for nausea. Negative for vomiting and abdominal pain.   Neurological: Positive for weakness and light-headedness.  All other systems reviewed and are negative.    Allergies  Review of patient's allergies indicates no known allergies.  Home Medications   Current Outpatient Rx  Name  Route  Sig  Dispense  Refill  . Ascorbic Acid (VITAMIN C PO)   Oral   Take 1-2 tablets by mouth daily.          Marland Kitchen aspirin 81 MG chewable tablet   Oral   Chew 1 tablet (81 mg total) by mouth daily.         Marland Kitchen atorvastatin (LIPITOR) 40 MG tablet   Oral   Take 1 tablet (40 mg total) by mouth daily.   30 tablet   11   . methylphenidate (CONCERTA) 27 MG CR tablet   Oral   Take 27 mg by mouth every morning.         . nitroGLYCERIN (NITROSTAT) 0.4 MG SL tablet   Sublingual   Place 1 tablet (0.4 mg total) under the tongue every 5 (five) minutes as needed for chest pain.   25 tablet   3   . omega-3 acid ethyl esters (LOVAZA) 1 G capsule   Oral   Take 4 g by mouth daily.         . prasugrel (EFFIENT) 10 MG TABS   Oral   Take 1 tablet (10 mg total) by mouth daily.   30 tablet  11   . Testosterone (TESTOPEL) 75 MG PLLT   Implant   1 each by Implant route as directed. Patient states that he receives this medication every 5 month for low testosterone 16 pellets last done at 1/30         . esomeprazole (NEXIUM) 40 MG capsule   Oral   Take 40 mg by mouth daily before breakfast.           BP 112/62  Pulse 100  Temp(Src) 98.2 F (36.8 C) (Oral)  Resp 16  SpO2 96%  Physical Exam  Nursing note and vitals reviewed. Constitutional: He appears well-developed and well-nourished. No distress.  Eyes: Conjunctivae are normal.  Neck: Neck supple.  Cardiovascular: Normal rate, regular rhythm and normal heart sounds.   Pulmonary/Chest: Effort normal and breath sounds normal. No respiratory distress. He has no wheezes. He has no rales.  Abdominal: Soft. Bowel sounds are normal. He exhibits no distension. There is no tenderness. There is no  rebound.  Musculoskeletal: He exhibits no edema.  Neurological: He is alert.  Skin: Skin is warm and dry. He is not diaphoretic.  Psychiatric: He has a normal mood and affect. His behavior is normal.    ED Course  Procedures (including critical care time)   Date: 11/27/2012  Rate: 89  Rhythm: normal sinus rhythm  QRS Axis: normal  Intervals: normal  ST/T Wave abnormalities: normal  Conduction Disutrbances:none  Narrative Interpretation: Abnormal R wave progression  Old EKG Reviewed: unchanged  Results for orders placed during the hospital encounter of 11/27/12  TROPONIN I      Result Value Range   Troponin I <0.30  <0.30 ng/mL  CBC WITH DIFFERENTIAL      Result Value Range   WBC 19.1 (*) 4.0 - 10.5 K/uL   RBC 4.59  4.22 - 5.81 MIL/uL   Hemoglobin 15.5  13.0 - 17.0 g/dL   HCT 16.1  09.6 - 04.5 %   MCV 96.1  78.0 - 100.0 fL   MCH 33.8  26.0 - 34.0 pg   MCHC 35.1  30.0 - 36.0 g/dL   RDW 40.9  81.1 - 91.4 %   Platelets 210  150 - 400 K/uL   Neutrophils Relative 78 (*) 43 - 77 %   Neutro Abs 15.0 (*) 1.7 - 7.7 K/uL   Lymphocytes Relative 15  12 - 46 %   Lymphs Abs 2.9  0.7 - 4.0 K/uL   Monocytes Relative 5  3 - 12 %   Monocytes Absolute 1.0  0.1 - 1.0 K/uL   Eosinophils Relative 1  0 - 5 %   Eosinophils Absolute 0.3  0.0 - 0.7 K/uL   Basophils Relative 0  0 - 1 %   Basophils Absolute 0.0  0.0 - 0.1 K/uL  COMPREHENSIVE METABOLIC PANEL      Result Value Range   Sodium 137  135 - 145 mEq/L   Potassium 4.0  3.5 - 5.1 mEq/L   Chloride 103  96 - 112 mEq/L   CO2 26  19 - 32 mEq/L   Glucose, Bld 89  70 - 99 mg/dL   BUN 17  6 - 23 mg/dL   Creatinine, Ser 7.82  0.50 - 1.35 mg/dL   Calcium 8.8  8.4 - 95.6 mg/dL   Total Protein 6.1  6.0 - 8.3 g/dL   Albumin 3.4 (*) 3.5 - 5.2 g/dL   AST 17  0 - 37 U/L   ALT 24  0 -  53 U/L   Alkaline Phosphatase 53  39 - 117 U/L   Total Bilirubin 0.5  0.3 - 1.2 mg/dL   GFR calc non Af Amer >90  >90 mL/min   GFR calc Af Amer >90  >90 mL/min   TROPONIN I      Result Value Range   Troponin I <0.30  <0.30 ng/mL   Dg Chest 2 View  11/27/2012  *RADIOLOGY REPORT*  Clinical Data: Short of breath and chest pain for 8 hours.  CHEST - 2 VIEW  Comparison: 09/01/2012.  Findings:  Cardiopericardial silhouette within normal limits. Mediastinal contours normal. Trachea midline.  No airspace disease or effusion.  Scarring is present at both lung bases, similar to prior exam. Chronic lower thoracic compression fracture.  IMPRESSION: No active cardiopulmonary disease or interval change.  Bilateral basilar scarring.   Original Report Authenticated By: Andreas Newport, M.D.    Spoke with Cardiology who came by and saw pt. Pt ok to go home, after 2nd troponin.  12:13 AM 2nd troponin negative. Discussed results with pt, will follow up given his leukocytosis. Pt is currently symptom free. Will give zofran odt at home.   1. Weakness   2. Chest pain   3. Leukocytosis       MDM  Pt with onset of generalized weakness, malaise, nausea, left sided chest pressure. Stents 3 months ago. Doing well since then. Today labs unremarkable other than leukocytosis of 19. Cardiology was consulted and has seen pt, do not think this is cardiac. Discussed results with pt. He is feeling better. Will d/c home with close follow up.   Filed Vitals:   11/27/12 1923 11/27/12 2123 11/27/12 2230 11/28/12 0005  BP: 112/62 105/52 101/54 101/55  Pulse: 100 86 87 90  Temp: 98.2 F (36.8 C)   99.4 F (37.4 C)  TempSrc: Oral   Oral  Resp: 16 14 24 16   SpO2: 96% 99% 96% 95%           Zoriana Oats A Torien Ramroop, PA-C 11/28/12 0015

## 2012-11-28 ENCOUNTER — Encounter (HOSPITAL_COMMUNITY): Payer: PRIVATE HEALTH INSURANCE

## 2012-11-28 NOTE — ED Provider Notes (Signed)
Medical screening examination/treatment/procedure(s) were performed by non-physician practitioner and as supervising physician I was immediately available for consultation/collaboration.  Geoffery Lyons, MD 11/28/12 [phone]

## 2012-11-30 ENCOUNTER — Encounter (HOSPITAL_COMMUNITY): Payer: PRIVATE HEALTH INSURANCE

## 2012-12-02 ENCOUNTER — Encounter (HOSPITAL_COMMUNITY): Payer: PRIVATE HEALTH INSURANCE

## 2012-12-05 ENCOUNTER — Encounter (HOSPITAL_COMMUNITY): Payer: PRIVATE HEALTH INSURANCE

## 2012-12-07 ENCOUNTER — Encounter (HOSPITAL_COMMUNITY): Payer: PRIVATE HEALTH INSURANCE

## 2012-12-09 ENCOUNTER — Encounter (HOSPITAL_COMMUNITY): Payer: PRIVATE HEALTH INSURANCE

## 2012-12-12 ENCOUNTER — Encounter (HOSPITAL_COMMUNITY): Payer: PRIVATE HEALTH INSURANCE

## 2012-12-14 ENCOUNTER — Encounter (HOSPITAL_COMMUNITY): Payer: PRIVATE HEALTH INSURANCE

## 2012-12-16 ENCOUNTER — Encounter (HOSPITAL_COMMUNITY): Payer: PRIVATE HEALTH INSURANCE

## 2012-12-19 ENCOUNTER — Encounter (HOSPITAL_COMMUNITY): Payer: PRIVATE HEALTH INSURANCE

## 2012-12-21 ENCOUNTER — Encounter (HOSPITAL_COMMUNITY): Payer: PRIVATE HEALTH INSURANCE

## 2012-12-23 ENCOUNTER — Encounter (HOSPITAL_COMMUNITY): Payer: PRIVATE HEALTH INSURANCE

## 2012-12-24 ENCOUNTER — Emergency Department
Admission: EM | Admit: 2012-12-24 | Discharge: 2012-12-24 | Disposition: A | Discharge status: Home / Self Care | Payer: PRIVATE HEALTH INSURANCE | Source: Home / Self Care | Attending: Family Medicine | Admitting: Family Medicine

## 2012-12-24 ENCOUNTER — Encounter: Payer: Self-pay | Admitting: *Deleted

## 2012-12-24 DIAGNOSIS — J309 Allergic rhinitis, unspecified: Secondary | ICD-10-CM

## 2012-12-24 DIAGNOSIS — J029 Acute pharyngitis, unspecified: Secondary | ICD-10-CM

## 2012-12-24 LAB — POCT RAPID STREP A (OFFICE): Rapid Strep A Screen: NEGATIVE

## 2012-12-24 MED ORDER — AMOXICILLIN 875 MG PO TABS: 875.0000 mg | ORAL_TABLET | Freq: Two times a day (BID) | ORAL | Status: DC

## 2012-12-24 MED ORDER — FLUTICASONE PROPIONATE 50 MCG/ACT NA SUSP: 2.0000 | Freq: Every day | NASAL | Status: DC

## 2012-12-24 NOTE — ED Provider Notes (Signed)
History     CSN: [id number]  Arrival date & time 12/24/12  1128   First MD Initiated Contact with Patient 12/24/12 1153      Chief Complaint  Patient presents with  . Sore Throat   HPI  SORE THROAT  Onset: 2-3 days  Description: sore throat, mild sinus drainge, no cough Modifying factors: known + strep close contact   Symptoms  Fever:  no URI symptoms: faint Cough: no Headache: no Rash:  no Swollen glands:   mild Recent Strep Exposure: yes LUQ pain: no Heartburn/brash: no Allergy Symptoms: mild, chronic   Red Flags STD exposure: n Breathing difficulty: no Drooling: no Trismus: no   Past Medical History  Diagnosis Date  . GERD (gastroesophageal reflux disease)   . Low testosterone   . ADD (attention deficit disorder)   . Hyperlipidemia LDL goal < 70   . NSTEMI (non-ST elevated myocardial infarction) December 2013    with PCI with DES to the ramus and LAD  . Asthmatic bronchitis   . Coronary artery disease     Past Surgical History  Procedure Laterality Date  . Cyst removal, mouth    . Coronary angioplasty with stent placement  08/18/2012    DES to the ramus and LAD    Family History  Problem Relation Age of Onset  . Emphysema Mother   . Heart failure Father   . Heart attack Father   . Heart attack Cousin     History  Substance Use Topics  . Smoking status: Never Smoker   . Smokeless tobacco: Never Used  . Alcohol Use: Yes      Review of Systems  All other systems reviewed and are negative.    Allergies  Review of patient's allergies indicates no known allergies.  Home Medications   Current Outpatient Rx  Name  Route  Sig  Dispense  Refill  . Ascorbic Acid (VITAMIN C PO)   Oral   Take 1-2 tablets by mouth daily.          Marland Kitchen aspirin 81 MG chewable tablet   Oral   Chew 1 tablet (81 mg total) by mouth daily.         Marland Kitchen atorvastatin (LIPITOR) 40 MG tablet   Oral   Take 1 tablet (40 mg total) by mouth daily.   30 tablet    11   . esomeprazole (NEXIUM) 40 MG capsule   Oral   Take 40 mg by mouth daily before breakfast.         . methylphenidate (CONCERTA) 27 MG CR tablet   Oral   Take 27 mg by mouth every morning.         . nitroGLYCERIN (NITROSTAT) 0.4 MG SL tablet   Sublingual   Place 1 tablet (0.4 mg total) under the tongue every 5 (five) minutes as needed for chest pain.   25 tablet   3   . omega-3 acid ethyl esters (LOVAZA) 1 G capsule   Oral   Take 4 g by mouth daily.         . ondansetron (ZOFRAN ODT) 8 MG disintegrating tablet   Oral   Take 1 tablet (8 mg total) by mouth every 8 (eight) hours as needed for nausea.   10 tablet   0   . prasugrel (EFFIENT) 10 MG TABS   Oral   Take 1 tablet (10 mg total) by mouth daily.   30 tablet   11   . Testosterone (TESTOPEL)  75 MG PLLT   Implant   1 each by Implant route as directed. Patient states that he receives this medication every 5 month for low testosterone 16 pellets last done at 1/30           BP 103/71  Pulse 80  Temp(Src) 97.8 F (36.6 C) (Oral)  Resp 16  Ht 5\' 10"  (1.778 m)  Wt 171 lb 4 oz (77.678 kg)  BMI 24.57 kg/m2  SpO2 95%  Physical Exam  Constitutional: He appears well-developed and well-nourished.  HENT:  Head: Normocephalic and atraumatic.  Right Ear: External ear normal.  Left Ear: External ear normal.  Mouth/Throat: Oropharyngeal exudate present.  +nasal erythema, rhinorrhea bilaterally, + post oropharyngeal erythema    Neck: Normal range of motion. Neck supple.  Mild cervical LAD   Cardiovascular: Normal rate, regular rhythm and normal heart sounds.   Pulmonary/Chest: Effort normal.  Abdominal: Soft.  Musculoskeletal: Normal range of motion.  Neurological: He is alert.  Skin: Skin is warm.    ED Course  Procedures (including critical care time)  Labs Reviewed  POCT RAPID STREP A (OFFICE)   No results found.   1. Pharyngitis   2. Allergic rhinitis       MDM  Rapid strep negative.   Centor score 3-4/4.  Will empirically treat with amox given sick contacts and elderly individual contacts in the home.  Flonase and zyrtec for allergic component.  Discussed general and ENT red flags.  Follow up as needed.      The patient and/or caregiver has been counseled thoroughly with regard to treatment plan and/or medications prescribed including dosage, schedule, interactions, rationale for use, and possible side effects and they verbalize understanding. Diagnoses and expected course of recovery discussed and will return if not improved as expected or if the condition worsens. Patient and/or caregiver verbalized understanding.             Doree Albee, MD 12/24/12 [phone]

## 2012-12-24 NOTE — ED Notes (Signed)
Pt c/o sore throat x 2 days. Pt states daughter positive for strep. Has not tried anything at home.

## 2012-12-26 ENCOUNTER — Encounter (HOSPITAL_COMMUNITY): Payer: PRIVATE HEALTH INSURANCE

## 2012-12-28 ENCOUNTER — Encounter (HOSPITAL_COMMUNITY): Payer: PRIVATE HEALTH INSURANCE

## 2012-12-30 ENCOUNTER — Encounter (HOSPITAL_COMMUNITY): Payer: PRIVATE HEALTH INSURANCE

## 2013-01-02 ENCOUNTER — Encounter (HOSPITAL_COMMUNITY): Payer: PRIVATE HEALTH INSURANCE

## 2013-01-02 ENCOUNTER — Encounter: Payer: Self-pay | Admitting: Cardiology

## 2013-01-02 ENCOUNTER — Ambulatory Visit (INDEPENDENT_AMBULATORY_CARE_PROVIDER_SITE_OTHER): Payer: PRIVATE HEALTH INSURANCE | Admitting: Cardiology

## 2013-01-02 VITALS — BP 126/84 | HR 78 | Ht 70.5 in | Wt 170.8 lb

## 2013-01-02 DIAGNOSIS — E785 Hyperlipidemia, unspecified: Secondary | ICD-10-CM

## 2013-01-02 DIAGNOSIS — I2510 Atherosclerotic heart disease of native coronary artery without angina pectoris: Secondary | ICD-10-CM

## 2013-01-02 NOTE — Progress Notes (Signed)
Melvin Martin Date of Birth: 08/21/1956 Medical Record #[id number]  History of Present Illness: Melvin Martin is seen back today for a followup visit. In December 2013 he has had a NSTEMI with PCI that included DES to the ramus and the LAD. On Effient for one year. Other issues include GERD and HLD.  On followup today he is feeling fairly well. He has had a couple of episodes where he went to the emergency room because he had some vague chest symptoms. Evaluation there was unremarkable. This past weekend he went with his children to an amusement center and did a great deal of walking up and down stairs without any difficulty. He admits that he has not been exercising regularly. 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  Current Outpatient Prescriptions on File Prior to Visit  Medication Sig Dispense Refill  . amoxicillin (AMOXIL) 875 MG tablet Take 1 tablet (875 mg total) by mouth 2 (two) times daily.  20 tablet  0  . Ascorbic Acid (VITAMIN C PO) Take 1-2 tablets by mouth daily.       Marland Kitchen aspirin 81 MG chewable tablet Chew 1 tablet (81 mg total) by mouth daily.      Marland Kitchen atorvastatin (LIPITOR) 40 MG tablet Take 1 tablet (40 mg total) by mouth daily.  30 tablet  11  . esomeprazole (NEXIUM) 40 MG capsule Take 40 mg by mouth daily before breakfast.      . methylphenidate (CONCERTA) 27 MG CR tablet Take 27 mg by mouth every morning.      . nitroGLYCERIN (NITROSTAT) 0.4 MG SL tablet Place 1 tablet (0.4 mg total) under the tongue every 5 (five) minutes as needed for chest pain.  25 tablet  3  . omega-3 acid ethyl esters (LOVAZA) 1 G capsule Take 4 g by mouth daily.      . prasugrel (EFFIENT) 10 MG TABS Take 1 tablet (10 mg total) by mouth daily.  30 tablet  11  . Testosterone (TESTOPEL) 75 MG PLLT 1 each by Implant route as directed. Patient states that he receives this medication every 5 month for low testosterone 16 pellets last done at 1/30       No current  facility-administered medications on file prior to visit.    No Known Allergies  Past Medical History  Diagnosis Date  . GERD (gastroesophageal reflux disease)   . Low testosterone   . ADD (attention deficit disorder)   . Hyperlipidemia LDL goal < 70   . NSTEMI (non-ST elevated myocardial infarction) December 2013    with PCI with DES to the ramus and LAD  . Asthmatic bronchitis   . Coronary artery disease     Past Surgical History  Procedure Laterality Date  . Cyst removal, mouth    . Coronary angioplasty with stent placement  08/18/2012    DES to the ramus and LAD    History  Smoking status  . Never Smoker   Smokeless tobacco  . Never Used    History  Alcohol Use  . Yes    Family History  Problem Relation Age of Onset  . Emphysema Mother   . Heart failure Father   . Heart attack Father   . Heart attack Cousin     Review of Systems: The review of systems is per the HPI.  All other systems were reviewed and are negative.  Physical Exam: BP 126/84  Pulse 78  Ht 5' 10.5" (  1.791 m)  Wt 170 lb 12.8 oz (77.474 kg)  BMI 24.15 kg/m2 Patient is very pleasant and in no acute distress. Skin is warm and dry. Color is normal.  HEENT is unremarkable. Normocephalic/atraumatic. PERRL. Sclera are nonicteric. Neck is supple. No masses. No JVD. Lungs are clear. Cardiac exam shows a regular rate and rhythm. Abdomen is soft. Extremities are without edema. Gait and ROM are intact. No gross neurologic deficits noted.   LABORATORY DATA:   Lab Results  Component Value Date   WBC 19.1* 11/27/2012   HGB 15.5 11/27/2012   HCT 44.1 11/27/2012   PLT 210 11/27/2012   GLUCOSE 89 11/27/2012   CHOL 86 10/03/2012   TRIG 80.0 10/03/2012   HDL 28.10* 10/03/2012   LDLCALC 42 10/03/2012   ALT 24 11/27/2012   AST 17 11/27/2012   NA 137 11/27/2012   K 4.0 11/27/2012   CL 103 11/27/2012   CREATININE 0.99 11/27/2012   BUN 17 11/27/2012   CO2 26 11/27/2012   TSH 1.564 08/18/2012   INR 1.04  08/18/2012    Assessment / Plan:  1. CAD s/p NSTEMI with PCI that included DES to the ramus and LAD - on Effient/Aspirin for one year. He is doing well. I have encouraged him to increase his aerobic activity and be consistent with it. He needs to pay more attention to his diet daily. I will followup again in 6 months.  2. HLD - continue statin therapy.

## 2013-01-02 NOTE — Patient Instructions (Signed)
Increase your aerobic activity and be consistent.  Continue a heart healthy diet ( Mediterranean )  I will see you in 6 months.

## 2013-01-04 ENCOUNTER — Encounter (HOSPITAL_COMMUNITY): Payer: PRIVATE HEALTH INSURANCE

## 2013-01-06 ENCOUNTER — Encounter (HOSPITAL_COMMUNITY): Payer: PRIVATE HEALTH INSURANCE

## 2013-01-07 ENCOUNTER — Encounter: Payer: Self-pay | Admitting: Cardiology

## 2013-01-09 ENCOUNTER — Encounter (HOSPITAL_COMMUNITY): Payer: PRIVATE HEALTH INSURANCE

## 2013-01-09 NOTE — Telephone Encounter (Signed)
Patient called received my chart message about taking krill oil.Dr.Jordan out of office this week will check with him next week and call you back.

## 2013-01-11 ENCOUNTER — Encounter (HOSPITAL_COMMUNITY): Payer: PRIVATE HEALTH INSURANCE

## 2013-01-13 ENCOUNTER — Encounter (HOSPITAL_COMMUNITY): Payer: PRIVATE HEALTH INSURANCE

## 2013-01-16 ENCOUNTER — Inpatient Hospital Stay (HOSPITAL_COMMUNITY): Admission: RE | Admit: 2013-01-16 | Payer: PRIVATE HEALTH INSURANCE | Source: Ambulatory Visit

## 2013-01-18 ENCOUNTER — Ambulatory Visit (HOSPITAL_COMMUNITY): Payer: PRIVATE HEALTH INSURANCE

## 2013-01-20 ENCOUNTER — Ambulatory Visit (HOSPITAL_COMMUNITY): Payer: PRIVATE HEALTH INSURANCE

## 2013-03-13 ENCOUNTER — Encounter: Payer: Self-pay | Admitting: Nurse Practitioner

## 2013-03-22 NOTE — Telephone Encounter (Signed)
Patient called, Norma Fredrickson NP received email.She advised nothing to worry about,B/P and heart rate ok.Advised to keep appointment  10/14 with Dr.Jordan advised call earlier if needed.

## 2013-07-04 ENCOUNTER — Ambulatory Visit: Payer: PRIVATE HEALTH INSURANCE | Admitting: Nurse Practitioner

## 2013-07-10 ENCOUNTER — Telehealth: Payer: Self-pay | Admitting: Nurse Practitioner

## 2013-07-10 NOTE — Telephone Encounter (Signed)
Left message on machine for pt to let pt know can have a light breakfast tomorrow and skip lunch pt will need fasting labs

## 2013-07-10 NOTE — Telephone Encounter (Signed)
New message    Pt is seeing Lawson Fiscal tomorrow.  Should he be fasting? His appt is at 2pm and he thinks she is drawing blood but no orders are in the computer as of today.

## 2013-07-11 ENCOUNTER — Ambulatory Visit (INDEPENDENT_AMBULATORY_CARE_PROVIDER_SITE_OTHER): Payer: PRIVATE HEALTH INSURANCE | Admitting: Nurse Practitioner

## 2013-07-11 ENCOUNTER — Encounter: Payer: Self-pay | Admitting: Nurse Practitioner

## 2013-07-11 VITALS — BP 120/82 | HR 64 | Ht 70.0 in | Wt 173.8 lb

## 2013-07-11 DIAGNOSIS — I2510 Atherosclerotic heart disease of native coronary artery without angina pectoris: Secondary | ICD-10-CM

## 2013-07-11 DIAGNOSIS — E785 Hyperlipidemia, unspecified: Secondary | ICD-10-CM

## 2013-07-11 LAB — LIPID PANEL
Cholesterol: 112 mg/dL (ref 0–200)
HDL: 32.1 mg/dL — ABNORMAL LOW (ref >39.00)
LDL Cholesterol: 64 mg/dL (ref 0–99)
Total CHOL/HDL Ratio: 3
Triglycerides: 82 mg/dL (ref 0.0–149.0)
VLDL: 16.4 mg/dL (ref 0.0–40.0)

## 2013-07-11 LAB — HEPATIC FUNCTION PANEL
ALT: 39 U/L (ref 0–53)
AST: 23 U/L (ref 0–37)
Albumin: 4.2 g/dL (ref 3.5–5.2)
Alkaline Phosphatase: 59 U/L (ref 39–117)
Bilirubin, Direct: 0.2 mg/dL (ref 0.0–0.3)
Total Bilirubin: 0.8 mg/dL (ref 0.3–1.2)
Total Protein: 6.7 g/dL (ref 6.0–8.3)

## 2013-07-11 LAB — CBC WITH DIFFERENTIAL/PLATELET
Basophils Absolute: 0.1 10*3/uL (ref 0.0–0.1)
Basophils Relative: 0.6 % (ref 0.0–3.0)
Eosinophils Absolute: 0.6 10*3/uL (ref 0.0–0.7)
Eosinophils Relative: 3.7 % (ref 0.0–5.0)
HCT: 46.1 % (ref 39.0–52.0)
Hemoglobin: 15.8 g/dL (ref 13.0–17.0)
Lymphocytes Relative: 40.6 % (ref 12.0–46.0)
Lymphs Abs: 6.4 10*3/uL — ABNORMAL HIGH (ref 0.7–4.0)
MCHC: 34.4 g/dL (ref 30.0–36.0)
MCV: 94.3 fl (ref 78.0–100.0)
Monocytes Absolute: 0.9 10*3/uL (ref 0.1–1.0)
Monocytes Relative: 5.9 % (ref 3.0–12.0)
Neutro Abs: 7.7 10*3/uL (ref 1.4–7.7)
Neutrophils Relative %: 49.2 % (ref 43.0–77.0)
Platelets: 238 10*3/uL (ref 150.0–400.0)
RBC: 4.89 Mil/uL (ref 4.22–5.81)
RDW: 12.8 % (ref 11.5–14.6)
WBC: 15.7 10*3/uL — ABNORMAL HIGH (ref 4.5–10.5)

## 2013-07-11 LAB — BASIC METABOLIC PANEL
BUN: 17 mg/dL (ref 6–23)
CO2: 29 mEq/L (ref 19–32)
Calcium: 9 mg/dL (ref 8.4–10.5)
Chloride: 100 mEq/L (ref 96–112)
Creatinine, Ser: 0.9 mg/dL (ref 0.4–1.5)
GFR: 96.76 mL/min (ref >60.00)
Glucose, Bld: 76 mg/dL (ref 70–99)
Potassium: 3.9 mEq/L (ref 3.5–5.1)
Sodium: 136 mEq/L (ref 135–145)

## 2013-07-11 NOTE — Patient Instructions (Addendum)
We need to check labs today  Stay active  Continue with your current medicines  See Dr. [place] in 6 months  Call the [facility] office at [phone] if you have any questions, problems or concerns.

## 2013-07-11 NOTE — Progress Notes (Signed)
Melvin Martin Date of Birth: 26-Jan-1956 Medical Record #[id number]  History of Present Illness: Melvin Martin is seen back today for a 6 month check. Seen for Dr. Sw[place]il[place]. He has known CAD with NSTEMI in December of 2013 with PCI that included DES to the ramus and LAD, HLD and GERD. He is the Best boy for [employer].   Last seen here in April of 2014 - was not exercising as much as he should. Had had some vague discomfort in his chest and had been to the ER with negative evaluation. He was to be on Effient/aspirin for one year total.   Comes back today. Here alone. Doing ok. Still with some occasional vague discomforts. Equates it to not sleeping enough. Lots of stress with aging parent, work, Catering manager.  Not exercising. Working pretty hard. Has a plan in place however at the first of the year - he will be staying home to take care of his kids and wife will be returning to work. He would like to continue his Effient for now and not stop next month.    Current Outpatient Prescriptions  Medication Sig Dispense Refill  . Ascorbic Acid (VITAMIN C PO) Take 1-2 tablets by mouth daily.       Marland Kitchen aspirin 81 MG chewable tablet Chew 1 tablet (81 mg total) by mouth daily.      Marland Kitchen atorvastatin (LIPITOR) 40 MG tablet Take 1 tablet (40 mg total) by mouth daily.  30 tablet  11  . esomeprazole (NEXIUM) 40 MG capsule Take 40 mg by mouth daily before breakfast.      . methylphenidate (CONCERTA) 27 MG CR tablet Take 27 mg by mouth every morning.      . nitroGLYCERIN (NITROSTAT) 0.4 MG SL tablet Place 1 tablet (0.4 mg total) under the tongue every 5 (five) minutes as needed for chest pain.  25 tablet  3  . Omega-3 Krill Oil 500 MG CAPS Take by mouth 2 (two) times daily.      . prasugrel (EFFIENT) 10 MG TABS Take 1 tablet (10 mg total) by mouth daily.  30 tablet  11  . Testosterone (TESTOPEL) 75 MG PLLT 1 each by Implant route as directed. Patient states that he receives this medication every 5 month for low testosterone 16  pellets last done at 1/30       No current facility-administered medications for this visit.    No Known Allergies  Past Medical History  Diagnosis Date  . GERD (gastroesophageal reflux disease)   . Low testosterone   . ADD (attention deficit disorder)   . Hyperlipidemia LDL goal < 70   . NSTEMI (non-ST elevated myocardial infarction) December 2013    with PCI with DES to the ramus and LAD  . Asthmatic bronchitis   . Coronary artery disease     Past Surgical History  Procedure Laterality Date  . Cyst removal, mouth    . Coronary angioplasty with stent placement  08/18/2012    DES to the ramus and LAD    History  Smoking status  . Never Smoker   Smokeless tobacco  . Never Used    History  Alcohol Use  . Yes    Family History  Problem Relation Age of Onset  . Emphysema Mother   . Heart failure Father   . Heart attack Father   . Heart attack Cousin     Review of Systems: The review of systems is per the HPI.  All other systems  were reviewed and are negative.  Physical Exam: BP 120/82  Pulse 64  Ht 5\' 10"  (1.778 m)  Wt 173 lb 12 oz (78.812 kg)  BMI 24.93 kg/m2 Patient is very pleasant and in no acute distress. Skin is warm and dry. Color is normal.  HEENT is unremarkable. Normocephalic/atraumatic. PERRL. Sclera are nonicteric. Neck is supple. No masses. No JVD. Lungs are clear. Cardiac exam shows a regular rate and rhythm. Abdomen is soft. Extremities are without edema. Gait and ROM are intact. No gross neurologic deficits noted.  LABORATORY DATA: Lab Results  Component Value Date   WBC 19.1* 11/27/2012   HGB 15.5 11/27/2012   HCT 44.1 11/27/2012   PLT 210 11/27/2012   GLUCOSE 89 11/27/2012   CHOL 86 10/03/2012   TRIG 80.0 10/03/2012   HDL 28.10* 10/03/2012   LDLCALC 42 10/03/2012   ALT 24 11/27/2012   AST 17 11/27/2012   NA 137 11/27/2012   K 4.0 11/27/2012   CL 103 11/27/2012   CREATININE 0.99 11/27/2012   BUN 17 11/27/2012   CO2 26 11/27/2012   TSH 1.564  08/18/2012   INR 1.04 08/18/2012   PROCEDURAL FINDINGS  Hemodynamics:  AO 98/60  LV 100/7  Coronary angiography:  Coronary dominance: right  Left mainstem: widely patent  Left anterior descending (LAD): Heavily diseased vessel with ectasia and nonobstructive proximal plaque. The mid-vessel has diffuse 75% stenosis with irregularity and lesion hypodensity. The distal vessel is patent without disease. There is small D2 branch with total occlusion and contrast stain.  Left circumflex (LCx): Large vessel, Ramus intermedius has critical 99% stenosis in the mid portion and mild nonobstructive disease in the prox vessel. The AV groove circ and OM branches are patent with nonobstructive disease.  Right coronary artery (RCA): Dominant, diffuse irregularity but no significant angiographic disease. PDA and PLA branches patent.  Left ventriculography: Left ventricular systolic function preserved with an LVEF of 55%. There is mild hypokinesis of the anterolateral wall.  PCI Note: Following the diagnostic procedure, the decision was made to proceed with PCI. There was critical stenosis of the ramus branch and severe stenosis with hypodensity and irregularity in the mid-LAD. The radial sheath was upsized to a 6 [place]. Weight-based bivalirudin was given for anticoagulation. Effient 60 mg was administered. Once a therapeutic ACT was achieved, a 6 [place] XB-LAD 3.5 cm guide catheter was inserted. A BMW coronary guidewire was used to cross the lesion in the Ramus. The lesion was predilated with a 2.5x15 mm balloon. The lesion was then stented with a 2.75 x 16 mm Promus Premier DES stent. The stent was postdilated with a 3.0 x 12 mm noncompliant balloon. Following PCI, there was 0% residual stenosis and TIMI-3 flow. Attention was then turned to the LAD. The vessel was wired with the same BMW wire. A 3.75x20 mm [place] balloon was used to measure the length of the lesion (it was not dilated). The lesion was then stented with a  3.5x32 mm Promus Premier DES. The stent was post-dilated with the 3.75 [place] balloon to high pressure. Final angiography confirmed an excellent result with TIMI-3 flow and 0% residual stenosis. The patient tolerated the procedure well. There were no immediate procedural complications. A TR band was used for radial hemostasis. The patient was transferred to the post catheterization recovery area for further monitoring.  PCI Data:  LESION 1  Vessel - Ramus/Segment - mid  Percent Stenosis (pre) 99  TIMI-flow 3  Stent 2.75x16 mm Promus DES  Percent  Stenosis (post) 0  TIMI-flow (post) 3  LESION 2  Vessel - LAD/Segment - mid  Percent Stenosis (pre) 75  TIMI-flow 3  Stent 3.5x32 mm Promus DES  Percent Stenosis (post) 0  TIMI-flow (post) 3  Final Conclusions:  1. 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 Nonobstructive RCA stenosis  3. Mild LV contraction abnormality with overall normal LVEF 55%  Recommendations:  DAPT with ASA and effient x 12 months, aggressive risk modification.  Tonny Bollman  08/18/2012, 6:22 PM   Assessment / Plan: 1. CAD - past NSTEMI with PCI - I have left him on his current regimen for now. He has a plan of action for his health - just needs to implement it - see Dr. [place] back in 6 months.   2. HLD - check labs today  Did have some leukocytosis on past labs - will recheck today as well.   Patient is agreeable to this plan and will call if any problems develop in the interim.   Rosalio Macadamia, RN, ANP-C [facility] [address] [place], [place]  [address]

## 2013-07-13 ENCOUNTER — Other Ambulatory Visit: Payer: Self-pay

## 2013-07-13 ENCOUNTER — Telehealth: Payer: Self-pay | Admitting: Nurse Practitioner

## 2013-07-13 NOTE — Telephone Encounter (Signed)
S/w pt cannot see lab results will s/w someone who is more effient in my chart and will get back to pt, pt has used mychart in the past

## 2013-07-13 NOTE — Telephone Encounter (Signed)
Pt called back to get some numbers of lab results aware it will take a week to release lab results on my chart

## 2013-07-13 NOTE — Telephone Encounter (Signed)
New message ° °Patient is returning your call. Please call back.  °

## 2013-07-13 NOTE — Telephone Encounter (Signed)
New Problem   Pt calling for results and requests to have them posted to mychart please assist.

## 2013-07-13 NOTE — Telephone Encounter (Signed)
To Daphene Calamity

## 2013-07-13 NOTE — Telephone Encounter (Signed)
lmovm to let pt know it takes a week to have the results released from my chart to be able to see lab results

## 2013-07-26 ENCOUNTER — Encounter: Payer: Self-pay | Admitting: Nurse Practitioner

## 2013-09-03 ENCOUNTER — Other Ambulatory Visit (HOSPITAL_COMMUNITY): Payer: Self-pay | Admitting: Physician Assistant

## 2013-09-04 ENCOUNTER — Emergency Department (INDEPENDENT_AMBULATORY_CARE_PROVIDER_SITE_OTHER)
Admission: EM | Admit: 2013-09-04 | Discharge: 2013-09-04 | Disposition: A | Discharge status: Home / Self Care | Payer: PRIVATE HEALTH INSURANCE | Source: Home / Self Care | Attending: Family Medicine | Admitting: Family Medicine

## 2013-09-04 ENCOUNTER — Encounter: Payer: Self-pay | Admitting: Emergency Medicine

## 2013-09-04 DIAGNOSIS — J069 Acute upper respiratory infection, unspecified: Secondary | ICD-10-CM

## 2013-09-04 DIAGNOSIS — J029 Acute pharyngitis, unspecified: Secondary | ICD-10-CM

## 2013-09-04 LAB — POCT RAPID STREP A (OFFICE): Rapid Strep A Screen: NEGATIVE

## 2013-09-04 MED ORDER — AZITHROMYCIN 250 MG PO TABS: ORAL_TABLET | ORAL | Status: DC

## 2013-09-04 MED ORDER — BENZONATATE 200 MG PO CAPS: 200.0000 mg | ORAL_CAPSULE | Freq: Every day | ORAL | Status: DC

## 2013-09-04 MED ORDER — PREDNISONE 20 MG PO TABS: 20.0000 mg | ORAL_TABLET | Freq: Two times a day (BID) | ORAL | Status: DC

## 2013-09-04 NOTE — ED Notes (Signed)
Haik c/o sore throat and dry cough without fever

## 2013-09-04 NOTE — ED Provider Notes (Signed)
CSN: [id number]     Arrival date & time 09/04/13  4098 History   First MD Initiated Contact with Patient 09/04/13 0940     Chief Complaint  Patient presents with  . Sore Throat      HPI Comments: Patient complains of 2 to 3 day history of increased sinus congestion, mild sore throat, cough, and fatigue.  No fevers, chills, and sweats.  He has a history of asthma but has not had wheezing or shortness of breath.  He has been under increased stress with a death in the family.  The history is provided by the patient.    Past Medical History  Diagnosis Date  . GERD (gastroesophageal reflux disease)   . Low testosterone   . ADD (attention deficit disorder)   . Hyperlipidemia LDL goal < 70   . NSTEMI (non-ST elevated myocardial infarction) December 2013    with PCI with DES to the ramus and LAD  . Asthmatic bronchitis   . Coronary artery disease    Past Surgical History  Procedure Laterality Date  . Cyst removal, mouth    . Coronary angioplasty with stent placement  08/18/2012    DES to the ramus and LAD   Family History  Problem Relation Age of Onset  . Emphysema Mother   . Heart failure Father   . Heart attack Father   . Heart attack Cousin    History  Substance Use Topics  . Smoking status: Never Smoker   . Smokeless tobacco: Never Used  . Alcohol Use: Yes    Review of Systems + sore throat + cough No pleuritic pain No wheezing No nasal congestion ? post-nasal drainage No sinus pain/pressure No itchy/red eyes No earache No hemoptysis No SOB No fever/chills No nausea No vomiting No abdominal pain No diarrhea No urinary symptoms No skin rash + fatigue No myalgias No headache Used OTC meds without relief  Allergies  Review of patient's allergies indicates no known allergies.  Home Medications   Current Outpatient Rx  Name  Route  Sig  Dispense  Refill  . Ascorbic Acid (VITAMIN C PO)   Oral   Take 1-2 tablets by mouth daily.          Marland Kitchen aspirin  81 MG chewable tablet   Oral   Chew 1 tablet (81 mg total) by mouth daily.         Marland Kitchen esomeprazole (NEXIUM) 40 MG capsule   Oral   Take 40 mg by mouth daily before breakfast.         . methylphenidate (CONCERTA) 27 MG CR tablet   Oral   Take 27 mg by mouth every morning.         . nitroGLYCERIN (NITROSTAT) 0.4 MG SL tablet   Sublingual   Place 1 tablet (0.4 mg total) under the tongue every 5 (five) minutes as needed for chest pain.   25 tablet   3   . Omega-3 Krill Oil 500 MG CAPS   Oral   Take by mouth 2 (two) times daily.         . prasugrel (EFFIENT) 10 MG TABS   Oral   Take 1 tablet (10 mg total) by mouth daily.   30 tablet   11   . Testosterone (TESTOPEL) 75 MG PLLT   Implant   1 each by Implant route as directed. Patient states that he receives this medication every 5 month for low testosterone 16 pellets last done at 1/30         .  azithromycin (ZITHROMAX Z-PAK) 250 MG tablet      Take 2 tabs today; then begin one tab once daily for 4 more days. (Rx void after 09/12/13)   6 each   0   . benzonatate (TESSALON) 200 MG capsule   Oral   Take 1 capsule (200 mg total) by mouth at bedtime. Take as needed for cough   12 capsule   0   . predniSONE (DELTASONE) 20 MG tablet   Oral   Take 1 tablet (20 mg total) by mouth 2 (two) times daily. Take with food.   10 tablet   0    BP 127/89  Pulse 83  Temp(Src) 98 F (36.7 C) (Oral)  Resp 16  Wt 174 lb (78.926 kg)  SpO2 95% Physical Exam Nursing notes and Vital Signs reviewed. Appearance:  Patient appears healthy, stated age, and in no acute distress Eyes:  Pupils are equal, round, and reactive to light and accomodation.  Extraocular movement is intact.  Conjunctivae are not inflamed  Ears:  Canals normal.  Tympanic membranes normal.  Nose:  Mildly congested turbinates.  No sinus tenderness.    Pharynx:  Normal Neck:  Supple.  Slightly tender shotty posterior nodes are palpated bilaterally  Lungs:  Clear  to auscultation.  Breath sounds are equal.  Heart:  Regular rate and rhythm without murmurs, rubs, or gallops.  Abdomen:  Nontender without masses or hepatosplenomegaly.  Bowel sounds are present.  No CVA or flank tenderness.  Extremities:  No edema.  No calf tenderness Skin:  No rash present.   ED Course  Procedures  None    Labs Reviewed  POCT RAPID STREP A (OFFICE) negative         MDM   1. Acute pharyngitis   2. Acute upper respiratory infections of unspecified site; suspect viral URI    There is no evidence of bacterial infection today.   Begin prednisone burst.  Prescription written for Benzonatate (Tessalon) to take at bedtime for night-time cough.  Take plain Mucinex (1200 mg guaifenesin) twice daily for cough and congestion.  Increase fluid intake, rest. May use Afrin nasal spray (or generic oxymetazoline) twice daily for about 5 days.  Also recommend using saline nasal spray several times daily and saline nasal irrigation (AYR is a common brand) Try warm salt water gargles for sore throat.  Stop all antihistamines for now, and other non-prescription cough/cold preparations. Begin Azithromycin if not improving about 5 days or if persistent fever develops (Given a prescription to hold, with an expiration date)  Follow-up with family doctor if not improving 7 to 10 days.    Lattie Haw, MD 09/07/13 1031

## 2013-10-30 ENCOUNTER — Emergency Department (INDEPENDENT_AMBULATORY_CARE_PROVIDER_SITE_OTHER)
Admission: EM | Admit: 2013-10-30 | Discharge: 2013-10-30 | Disposition: A | Discharge status: Home / Self Care | Payer: 59 | Source: Home / Self Care | Attending: Family Medicine | Admitting: Family Medicine

## 2013-10-30 ENCOUNTER — Encounter: Payer: Self-pay | Admitting: Emergency Medicine

## 2013-10-30 DIAGNOSIS — B309 Viral conjunctivitis, unspecified: Secondary | ICD-10-CM

## 2013-10-30 NOTE — ED Notes (Signed)
Mansel c/o right eye redness, pain and drainage x this AM.

## 2013-10-30 NOTE — Discharge Instructions (Signed)
Apply cool compress several times daily.  Instill refrigerated lubricating eye drops ("Refresh" brand, etc.) several times daily.  Call if increased redness and/or drainage occurs.   Viral Conjunctivitis Conjunctivitis is an irritation (inflammation) of the clear membrane that covers the white part of the eye (the conjunctiva). The irritation can also happen on the underside of the eyelids. Conjunctivitis makes the eye red or pink in color. This is what is commonly known as pink eye. Viral conjunctivitis can spread easily (contagious). CAUSES   Infection from virus on the surface of the eye.  Infection from the irritation or injury of nearby tissues such as the eyelids or cornea.  More serious inflammation or infection on the inside of the eye.  Other eye diseases.  The use of certain eye medications. SYMPTOMS  The normally white color of the eye or the underside of the eyelid is usually pink or red in color. The pink eye is usually associated with irritation, tearing and some sensitivity to light. Viral conjunctivitis is often associated with a clear, watery discharge. If a discharge is present, there may also be some blurred vision in the affected eye. DIAGNOSIS  Conjunctivitis is diagnosed by an eye exam. The eye specialist looks for changes in the surface tissues of the eye which take on changes characteristic of the specific types of conjunctivitis. A sample of any discharge may be collected on a Q-Tip (sterile swap). The sample will be sent to a lab to see whether or not the inflammation is caused by bacterial or viral infection. TREATMENT  Viral conjunctivitis will not respond to medicines that kill germs (antibiotics). Treatment is aimed at stopping a bacterial infection on top of the viral infection. The goal of treatment is to relieve symptoms (such as itching) with antihistamine drops or other eye medications.  HOME CARE INSTRUCTIONS   To ease discomfort, apply a cool, clean wash  cloth to your eye for 10 to 20 minutes, 3 to 4 times a day.  Gently wipe away any drainage from the eye with a warm, wet washcloth or a cotton ball.  Wash your hands often with soap and use paper towels to dry.  Do not share towels or washcloths. This may spread the infection.  Change or wash your pillowcase every day.  You should not use eye make-up until the infection is gone.  Stop using contacts lenses. Ask your eye professional how to sterilize or replace them before using again. This depends on the type of contact lenses used.  Do not touch the edge of the eyelid with the eye drop bottle or ointment tube when applying medications to the affected eye. This will stop you from spreading the infection to the other eye or to others. SEEK IMMEDIATE MEDICAL CARE IF:   The infection has not improved within 3 days of beginning treatment.  A watery discharge from the eye develops.  Pain in the eye increases.  The redness is spreading.  Vision becomes blurred.  An oral temperature above 102 F (38.9 C) develops, or as your caregiver suggests.  Facial pain, redness or swelling develops.  Any problems that may be related to the prescribed medicine develop. MAKE SURE YOU:   Understand these instructions.  Will watch your condition.  Will get help right away if you are not doing well or get worse. Document Released: 08/24/2005 Document Revised: 11/16/2011 Document Reviewed: 04/12/2008 [facility] Patient Information 2014 [place].

## 2013-10-30 NOTE — ED Provider Notes (Signed)
CSN: [id number]     Arrival date & time 10/30/13  1142 History   First MD Initiated Contact with Patient 10/30/13 1235     Chief Complaint  Patient presents with  . Eye Drainage        HPI Comments: Patient awoke today with slight redness and mild discomfort in his right eye, but no foreign body sensation.  No change in vision.  No nasal congestion.  The history is provided by the patient.    Past Medical History  Diagnosis Date  . GERD (gastroesophageal reflux disease)   . Low testosterone   . ADD (attention deficit disorder)   . Hyperlipidemia LDL goal < 70   . NSTEMI (non-ST elevated myocardial infarction) December 2013    with PCI with DES to the ramus and LAD  . Asthmatic bronchitis   . Coronary artery disease    Past Surgical History  Procedure Laterality Date  . Cyst removal, mouth    . Coronary angioplasty with stent placement  08/18/2012    DES to the ramus and LAD   Family History  Problem Relation Age of Onset  . Emphysema Mother   . Heart failure Father   . Heart attack Father   . Heart attack Cousin    History  Substance Use Topics  . Smoking status: Never Smoker   . Smokeless tobacco: Never Used  . Alcohol Use: Yes    Review of Systems No sore throat No cough No pleuritic pain No wheezing No nasal congestion No post-nasal drainage No sinus pain/pressure + itchy/red right eye No earache No hemoptysis No SOB No fever/chills No nausea No vomiting No abdominal pain No diarrhea No urinary symptoms No skin rash No fatigue No myalgias No headache Used OTC meds without relief    Allergies  Review of patient's allergies indicates no known allergies.  Home Medications   Current Outpatient Rx  Name  Route  Sig  Dispense  Refill  . aspirin 81 MG chewable tablet   Oral   Chew 1 tablet (81 mg total) by mouth daily.         Marland Kitchen atorvastatin (LIPITOR) 40 MG tablet   Oral   Take 40 mg by mouth daily.         Marland Kitchen esomeprazole (NEXIUM)  40 MG capsule   Oral   Take 40 mg by mouth daily before breakfast.         . methylphenidate (CONCERTA) 27 MG CR tablet   Oral   Take 27 mg by mouth every morning.         . nitroGLYCERIN (NITROSTAT) 0.4 MG SL tablet   Sublingual   Place 1 tablet (0.4 mg total) under the tongue every 5 (five) minutes as needed for chest pain.   25 tablet   3   . Omega-3 Krill Oil 500 MG CAPS   Oral   Take by mouth 2 (two) times daily.         . prasugrel (EFFIENT) 10 MG TABS   Oral   Take 1 tablet (10 mg total) by mouth daily.   30 tablet   11   . Ascorbic Acid (VITAMIN C PO)   Oral   Take 1-2 tablets by mouth daily.          . Testosterone (TESTOPEL) 75 MG PLLT   Implant   1 each by Implant route as directed. Patient states that he receives this medication every 5 month for low testosterone 16 pellets last  done at 1/30          BP 117/77  Pulse 78  Temp(Src) 97.8 F (36.6 C) (Oral)  Resp 14  Wt 168 lb (76.204 kg)  SpO2 97% Physical Exam Nursing notes and Vital Signs reviewed. Appearance:  Patient appears healthy, stated age, and in no acute distress Eyes:  Pupils are equal, round, and reactive to light and accomodation.  Extraocular movement is intact.  Conjunctivae are not injected, but right conjunctivae slightly hyperemic medially and laterally.  No discharge present.  Fundi normal.  No photophobia.  No lid tenderness or swelling Ears:  Canals normal.  Tympanic membranes normal.  Nose:  Mildly congested turbinates.  No sinus tenderness.   Pharynx:  Normal Neck:  Supple.  No adenopathy Skin:  No rash present.   ED Course  Procedures  none       MDM   Final diagnoses:  Viral conjunctivitis   Reassurance. Apply cool compress several times daily.  Instill refrigerated lubricating eye drops ("Refresh" brand, etc.) several times daily.  Call if increased redness and/or drainage occurs.    Kandra Nicolas, MD 11/01/13 [phone]

## 2014-07-24 ENCOUNTER — Telehealth: Payer: Self-pay

## 2014-07-24 NOTE — Telephone Encounter (Signed)
Received a call from patient he stated he has been having chest pain off and on for the last week.Stated he has been under a lot of stress.Appointment scheduled with Dr.Jordan 07/27/14 at 4:00 pm.Advised to go to ER if needed.

## 2014-07-27 ENCOUNTER — Ambulatory Visit (INDEPENDENT_AMBULATORY_CARE_PROVIDER_SITE_OTHER): Payer: 59 | Admitting: Cardiology

## 2014-07-27 ENCOUNTER — Encounter: Payer: Self-pay | Admitting: Cardiology

## 2014-07-27 VITALS — BP 120/88 | HR 68 | Ht 70.0 in | Wt 169.2 lb

## 2014-07-27 DIAGNOSIS — I25118 Atherosclerotic heart disease of native coronary artery with other forms of angina pectoris: Secondary | ICD-10-CM

## 2014-07-27 DIAGNOSIS — E784 Other hyperlipidemia: Secondary | ICD-10-CM

## 2014-07-27 DIAGNOSIS — E785 Hyperlipidemia, unspecified: Secondary | ICD-10-CM

## 2014-07-27 MED ORDER — NITROGLYCERIN 0.4 MG SL SUBL: 0.4000 mg | SUBLINGUAL_TABLET | SUBLINGUAL | Status: DC | PRN

## 2014-07-27 NOTE — Progress Notes (Signed)
[name] Date of Birth: 1956/05/01 Medical Record #[id number]  History of Present Illness: Melvin Martin is seen as a work in today for evaluation of chest pain.  He has known CAD with NSTEMI in December of 2013 with PCI that included DES to the ramus and LAD, HLD and GERD. 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  Last seen one year ago. No longer on Effient.  He reports that over the past week he has experienced increased chest discomfort. This is described as tightness in the lower chest and epigastrium. It comes and goes. It is worse as the day progresses. It is different than the pain he had with his MI. He admits to being under increased stress at home. On one occasion he noted he got winded and fatigued with exertion.    Current Outpatient Prescriptions  Medication Sig Dispense Refill  . Ascorbic Acid (VITAMIN C PO) Take 1-2 tablets by mouth daily.     Marland Kitchen aspirin 81 MG chewable tablet Chew 1 tablet (81 mg total) by mouth daily.    . CONCERTA 27 MG CR tablet     . CRESTOR 20 MG tablet Take 20 mg by mouth at bedtime.  5  . esomeprazole (NEXIUM) 40 MG capsule Take 40 mg by mouth daily before breakfast.    . methylphenidate (CONCERTA) 27 MG CR tablet Take 27 mg by mouth every morning.    . nitroGLYCERIN (NITROSTAT) 0.4 MG SL tablet Place 1 tablet (0.4 mg total) under the tongue every 5 (five) minutes as needed for chest pain. 25 tablet 6  . Omega-3 Krill Oil 500 MG CAPS Take by mouth 2 (two) times daily.    . pantoprazole (PROTONIX) 40 MG tablet Take 40 mg by mouth daily.  5  . Testosterone (TESTOPEL) 75 MG PLLT 1 each by Implant route as directed. Patient states that he receives this medication every 5 month for low testosterone 16 pellets last done at 1/30     No current facility-administered medications for this visit.    No Known Allergies  Past Medical History  Diagnosis Date  . GERD (gastroesophageal reflux disease)   . Low testosterone   . ADD (attention deficit disorder)     . Hyperlipidemia LDL goal < 70   . NSTEMI (non-ST elevated myocardial infarction) December 2013    with PCI with DES to the ramus and LAD  . Asthmatic bronchitis   . Coronary artery disease     Past Surgical History  Procedure Laterality Date  . Cyst removal, mouth    . Coronary angioplasty with stent placement  08/18/2012    DES to the ramus and LAD    History  Smoking status  . Never Smoker   Smokeless tobacco  . Never Used    History  Alcohol Use  . Yes    Family History  Problem Relation Age of Onset  . Emphysema Mother   . Heart failure Father   . Heart attack Father   . Heart attack Cousin     Review of Systems: The review of systems is per the HPI.  All other systems were reviewed and are negative.  Physical Exam: BP 120/88 mmHg  Pulse 68  Ht 5\' 10"  (1.778 m)  Wt 169 lb 3.2 oz (76.749 kg)  BMI 24.28 kg/m2 Patient is very pleasant and in no acute distress. Skin is warm and dry. Color is normal.  HEENT is unremarkable. Normocephalic/atraumatic. PERRL. Sclera are nonicteric. Neck is supple. No  masses. No JVD. Lungs are clear. Cardiac exam shows a regular rate and rhythm. Normal S1-2. No gallop or murmur. No chest wall tenderness to palpation. Abdomen is soft. Extremities are without edema. Gait and ROM are intact. No gross neurologic deficits noted.  LABORATORY DATA: Lab Results  Component Value Date   WBC 15.7* 07/11/2013   HGB 15.8 07/11/2013   HCT 46.1 07/11/2013   PLT 238.0 07/11/2013   GLUCOSE 76 07/11/2013   CHOL 112 07/11/2013   TRIG 82.0 07/11/2013   HDL 32.10* 07/11/2013   LDLCALC 64 07/11/2013   ALT 39 07/11/2013   AST 23 07/11/2013   NA 136 07/11/2013   K 3.9 07/11/2013   CL 100 07/11/2013   CREATININE 0.9 07/11/2013   BUN 17 07/11/2013   CO2 29 07/11/2013   TSH 1.564 08/18/2012   INR 1.04 08/18/2012   Ecg today:NSR, normal Ecg. I have personally reviewed and interpreted this study.   Assessment / Plan: 1. CAD - past NSTEMI with  PCI of the LAD and ramus intermediate with DES. Now with symptoms of chest pain with atypical features. Will renew sl Ntg Rx. Will schedule for stress myoview next week. If symptoms of chest pain worsen he is instructed to go the ED.   2. HLD - on statin therapy. Labs followed by Dr. Brigitte Pulse.

## 2014-07-27 NOTE — Patient Instructions (Signed)
We will schedule you for a nuclear stress test  We will refill Ntg.

## 2014-08-13 ENCOUNTER — Emergency Department (INDEPENDENT_AMBULATORY_CARE_PROVIDER_SITE_OTHER)
Admission: EM | Admit: 2014-08-13 | Discharge: 2014-08-13 | Disposition: A | Discharge status: Home / Self Care | Payer: 59 | Source: Home / Self Care | Attending: Emergency Medicine | Admitting: Emergency Medicine

## 2014-08-13 ENCOUNTER — Encounter: Payer: Self-pay | Admitting: Emergency Medicine

## 2014-08-13 ENCOUNTER — Emergency Department (INDEPENDENT_AMBULATORY_CARE_PROVIDER_SITE_OTHER): Payer: 59

## 2014-08-13 DIAGNOSIS — J45909 Unspecified asthma, uncomplicated: Secondary | ICD-10-CM

## 2014-08-13 DIAGNOSIS — R05 Cough: Secondary | ICD-10-CM

## 2014-08-13 DIAGNOSIS — R059 Cough, unspecified: Secondary | ICD-10-CM

## 2014-08-13 DIAGNOSIS — J208 Acute bronchitis due to other specified organisms: Secondary | ICD-10-CM

## 2014-08-13 MED ORDER — AZITHROMYCIN 250 MG PO TABS: 250.0000 mg | ORAL_TABLET | Freq: Every day | ORAL | Status: DC

## 2014-08-13 MED ORDER — PREDNISONE 10 MG PO TABS: ORAL_TABLET | ORAL | Status: DC

## 2014-08-13 NOTE — ED Provider Notes (Signed)
CSN: [id number]     Arrival date & time 08/13/14  1737 History   First MD Initiated Contact with Patient 08/13/14 1757     Chief Complaint  Patient presents with  . Cough  . Nasal Congestion  . Generalized Body Aches  . Chest Pain  . Shortness of Breath  . Sore Throat   (Consider location/radiation/quality/duration/timing/severity/associated sxs/prior Treatment) Patient is a 58 y.o. male presenting with cough, chest pain, shortness of breath, and pharyngitis. The history is provided by the patient. No language interpreter was used.  Cough Cough characteristics:  Productive Sputum characteristics:  Nondescript Severity:  Moderate Onset quality:  Gradual Duration:  3 days Timing:  Constant Progression:  Worsening Chronicity:  New Smoker: no   Relieved by:  Beta-agonist inhaler Worsened by:  Nothing tried Ineffective treatments:  None tried Associated symptoms: chest pain and shortness of breath   Chest Pain Associated symptoms: cough and shortness of breath   Shortness of Breath Associated symptoms: chest pain and cough   Sore Throat Associated symptoms include chest pain and shortness of breath.  Pt has a history of asthma and asthmatic bronchitis.  Pt reports he normally takes prednisone and an antibiotic when he has these symptoms.   Pt alos has reflux and has been having chest pain for 1 month.  He is seeing Dr. [name].   He has a stress test scheduled on Thursday.   Past Medical History  Diagnosis Date  . GERD (gastroesophageal reflux disease)   . Low testosterone   . ADD (attention deficit disorder)   . Hyperlipidemia LDL goal < 70   . NSTEMI (non-ST elevated myocardial infarction) December 2013    with PCI with DES to the ramus and LAD  . Asthmatic bronchitis   . Coronary artery disease    Past Surgical History  Procedure Laterality Date  . Cyst removal, mouth    . Coronary angioplasty with stent placement  08/18/2012    DES to the ramus and LAD   Family  History  Problem Relation Age of Onset  . Emphysema Mother   . Heart failure Father   . Heart attack Father   . Heart attack Cousin    History  Substance Use Topics  . Smoking status: Never Smoker   . Smokeless tobacco: Never Used  . Alcohol Use: Yes    Review of Systems  Respiratory: Positive for cough and shortness of breath.   Cardiovascular: Positive for chest pain.  All other systems reviewed and are negative.   Allergies  Review of patient's allergies indicates no known allergies.  Home Medications   Prior to Admission medications   Medication Sig Start Date End Date Taking? Authorizing Provider  Ascorbic Acid (VITAMIN C PO) Take 1-2 tablets by mouth daily.     Historical Provider, MD  aspirin 81 MG chewable tablet Chew 1 tablet (81 mg total) by mouth daily. 08/19/12   Rhonda G Barrett, PA-C  CONCERTA 27 MG CR tablet  06/21/14   Historical Provider, MD  CRESTOR 20 MG tablet Take 20 mg by mouth at bedtime. 07/17/14   Historical Provider, MD  esomeprazole (NEXIUM) 40 MG capsule Take 40 mg by mouth daily before breakfast.    Historical Provider, MD  methylphenidate (CONCERTA) 27 MG CR tablet Take 27 mg by mouth every morning.    Historical Provider, MD  nitroGLYCERIN (NITROSTAT) 0.4 MG SL tablet Place 1 tablet (0.4 mg total) under the tongue every 5 (five) minutes as needed for chest pain.  07/27/14   [name] [name], MD  Omega-3 Krill Oil 500 MG CAPS Take by mouth 2 (two) times daily.    Historical Provider, MD  pantoprazole (PROTONIX) 40 MG tablet Take 40 mg by mouth daily. 07/17/14   Historical Provider, MD  Testosterone (TESTOPEL) 75 MG PLLT 1 each by Implant route as directed. Patient states that he receives this medication every 5 month for low testosterone 16 pellets last done at 1/30    Historical Provider, MD   BP 134/90 mmHg  Pulse 100  Temp(Src) 98.3 F (36.8 C) (Oral)  Resp 18  Ht 5\' 10"  (1.778 m)  Wt 170 lb (77.111 kg)  BMI 24.39 kg/m2  SpO2 98% Physical  Exam  Constitutional: He is oriented to person, place, and time. He appears well-developed and well-nourished.  HENT:  Head: Normocephalic and atraumatic.  Right Ear: External ear normal.  Eyes: Conjunctivae and EOM are normal. Pupils are equal, round, and reactive to light.  Neck: Normal range of motion.  Cardiovascular: Normal rate and normal heart sounds.   Pulmonary/Chest: Effort normal.  rhonchi  Abdominal: He exhibits no distension.  Musculoskeletal: Normal range of motion.  Neurological: He is alert and oriented to person, place, and time.  Skin: Skin is warm.  Psychiatric: He has a normal mood and affect.  Nursing note and vitals reviewed.   ED Course  Procedures (including critical care time) Labs Review Labs Reviewed - No data to display  Imaging Review Dg Chest 2 View  08/13/2014   CLINICAL DATA:  Cough.  Shortness of breath.  Asthmatic bronchitis.  EXAM: CHEST  2 VIEW  COMPARISON:  11/27/2012  FINDINGS: Large lung volumes. Cardiac and mediastinal margins appear normal. No airspace opacity or pleural effusion.  IMPRESSION: 1. Large lung volumes, possibly a manifestation of air trapping and bronchitis. Otherwise, no significant abnormalities are observed.   Electronically Signed   By: Sherryl Barters M.D.   On: 08/13/2014 18:35     MDM  I reviewed Dr. Doug Sou note.   Pt understands need to see Dr. [name] go to ED if worse.  I will treat the bronchitis asthma part of illness and I recommended mylanta to help with GERD   1. Cough    Prednsione zithromax Keep appointment with Dr. [name]. Go to ED if chest pain changes/worsens.    [place], PA-C 08/13/14 [phone]

## 2014-08-13 NOTE — Discharge Instructions (Signed)

## 2014-08-13 NOTE — ED Notes (Signed)
States other family members have URIs; his symptoms of congestion, cough began 3 days ago with progressive discomfort in sinuses and upper lung areas. Has history of asthmatic bronchitis. Has had MI and has been seen recently by his cardiologist for chest pain over the past month; is scheduled for cardiac stress test.

## 2014-08-14 ENCOUNTER — Telehealth (HOSPITAL_COMMUNITY): Payer: Self-pay

## 2014-08-14 NOTE — Telephone Encounter (Signed)
Encounter complete. 

## 2014-08-16 ENCOUNTER — Encounter (HOSPITAL_COMMUNITY): Payer: 59

## 2014-08-16 ENCOUNTER — Encounter (HOSPITAL_COMMUNITY): Payer: Self-pay | Admitting: Cardiovascular Disease

## 2014-09-11 ENCOUNTER — Telehealth (HOSPITAL_COMMUNITY): Payer: Self-pay

## 2014-09-11 NOTE — Telephone Encounter (Signed)
Encounter complete. 

## 2014-09-12 ENCOUNTER — Telehealth (HOSPITAL_COMMUNITY): Payer: Self-pay

## 2014-09-12 NOTE — Telephone Encounter (Signed)
Encounter complete. 

## 2014-09-13 ENCOUNTER — Ambulatory Visit (HOSPITAL_COMMUNITY)
Admission: RE | Admit: 2014-09-13 | Discharge: 2014-09-13 | Disposition: A | Discharge status: Home / Self Care | Payer: 59 | Source: Ambulatory Visit | Attending: Cardiology | Admitting: Cardiology

## 2014-09-13 DIAGNOSIS — E785 Hyperlipidemia, unspecified: Secondary | ICD-10-CM | POA: Diagnosis not present

## 2014-09-13 DIAGNOSIS — Z8249 Family history of ischemic heart disease and other diseases of the circulatory system: Secondary | ICD-10-CM | POA: Diagnosis not present

## 2014-09-13 DIAGNOSIS — R079 Chest pain, unspecified: Secondary | ICD-10-CM | POA: Diagnosis present

## 2014-09-13 DIAGNOSIS — R5383 Other fatigue: Secondary | ICD-10-CM | POA: Diagnosis not present

## 2014-09-13 DIAGNOSIS — R0609 Other forms of dyspnea: Secondary | ICD-10-CM | POA: Diagnosis not present

## 2014-09-13 DIAGNOSIS — I25118 Atherosclerotic heart disease of native coronary artery with other forms of angina pectoris: Secondary | ICD-10-CM

## 2014-09-13 MED ORDER — TECHNETIUM TC 99M SESTAMIBI GENERIC - CARDIOLITE
30.0000 | Freq: Once | INTRAVENOUS | Status: AC | PRN
  Administered 2014-09-13: 30 via INTRAVENOUS

## 2014-09-13 MED ORDER — TECHNETIUM TC 99M SESTAMIBI GENERIC - CARDIOLITE
10.0000 | Freq: Once | INTRAVENOUS | Status: AC | PRN
Start: 2014-09-13 — End: 2014-09-13
  Administered 2014-09-13: 10 via INTRAVENOUS

## 2014-09-13 NOTE — Procedures (Addendum)
[facility] [address] [place] [address] [phone]  Cardiology Nuclear Med Study  Melvin Martin is a 59 y.o. male     MRN : [id number]     DOB: 1955/09/29  Procedure Date: 09/13/2014  Nuclear Med Background Indication for Stress Test:  Evaluation for Ischemia History:  Asthma and CAD;MI 08/2012;STENT/PTCA 08/2012;Pt had NUC MPI in 2010-Results not in Epic Cardiac Risk Factors: Family History - CAD and Lipids  Symptoms:  Chest Pain, DOE and Fatigue   Nuclear Pre-Procedure Caffeine/Decaff Intake:  9:30pm NPO After: 7:30am   IV Site: R Forearm  IV 0.9% NS with Angio Cath:  22g  Chest Size (in):  40" IV Started by: Rolene Course, RN  Height: 5\' 10"  (1.778 m)  Cup Size: n/a  BMI:  Body mass index is 24.25 kg/(m^2). Weight:  169 lb (76.658 kg)   Tech Comments:  n/a    Nuclear Med Study 1 or 2 day study: 1 day  Stress Test Type:  Stress  Order Authorizing Provider:  Peter [name], MD   Resting Radionuclide: Technetium 50m Sestamibi  Resting Radionuclide Dose: 10.3 mCi   Stress Radionuclide:  Technetium 47m Sestamibi  Stress Radionuclide Dose: 31.2 mCi           Stress Protocol Rest HR:69 Stress HR: 148  Rest BP: 113/89 Stress BP: 202/90  Exercise Time (min): 9:50 METS: 10.70   Predicted Max HR: 164 bpm % Max HR: 90.24 bpm Rate Pressure Product: [phone]  Dose of Adenosine (mg):  n/a Dose of Lexiscan: n/a mg  Dose of Atropine (mg): n/a Dose of Dobutamine: n/a mcg/kg/min (at max HR)  Stress Test Technologist: Mellody Memos, CCT Nuclear Technologist: Otho Perl, CNMT   Rest Procedure:  Myocardial perfusion imaging was performed at rest 45 minutes following the intravenous administration of Technetium 18m Sestamibi. Stress Procedure:  The patient performed treadmill exercise using a Bruce  Protocol for 9 minutes 50 second. The patient stopped due to extreme shortness of breath due to his chronic bronchitis. Patient  denied any chest pain.  There were no significant ST-T wave changes.  Technetium 29m Sestamibi was injected at peak exercise and myocardial perfusion imaging was performed after a brief delay.  Transient Ischemic Dilatation (Normal <1.22):  1.03 QGS EDV:  123 ml QGS ESV:  63 ml LV Ejection Fraction: 49%  Rest ECG: NSR - Normal EKG  Stress ECG: No significant ST segment change suggestive of ischemia.  QPS Raw Data Images:  Normal; no motion artifact; normal heart/lung ratio. Stress Images:  Normal homogeneous uptake in all areas of the myocardium. Rest Images:  Normal homogeneous uptake in all areas of the myocardium. Subtraction (SDS):  No evidence of ischemia.  Impression Exercise Capacity:  Excellent exercise capacity. BP Response:  Hypertensive blood pressure response. Clinical Symptoms:  No significant symptoms noted. ECG Impression:  No significant ST segment change suggestive of ischemia. Comparison with Prior Nuclear Study: No previous nuclear study performed  Overall Impression:  Low risk stress nuclear study without reversible ischemia.  LV Wall Motion:  LVEF 49%, mild septal hypokinesis  Pixie Casino, MD, [facility] Attending Cardiologist [place], MD  09/13/2014 12:43 PM

## 2014-12-29 IMAGING — CR DG CHEST 2V
2 series · 2 of 2 positions shown · non-contrast
Comparison: 09/01/2012.

CLINICAL DATA: Short of breath and chest pain for 8 hours.

CHEST - 2 VIEW

[w chest pa]
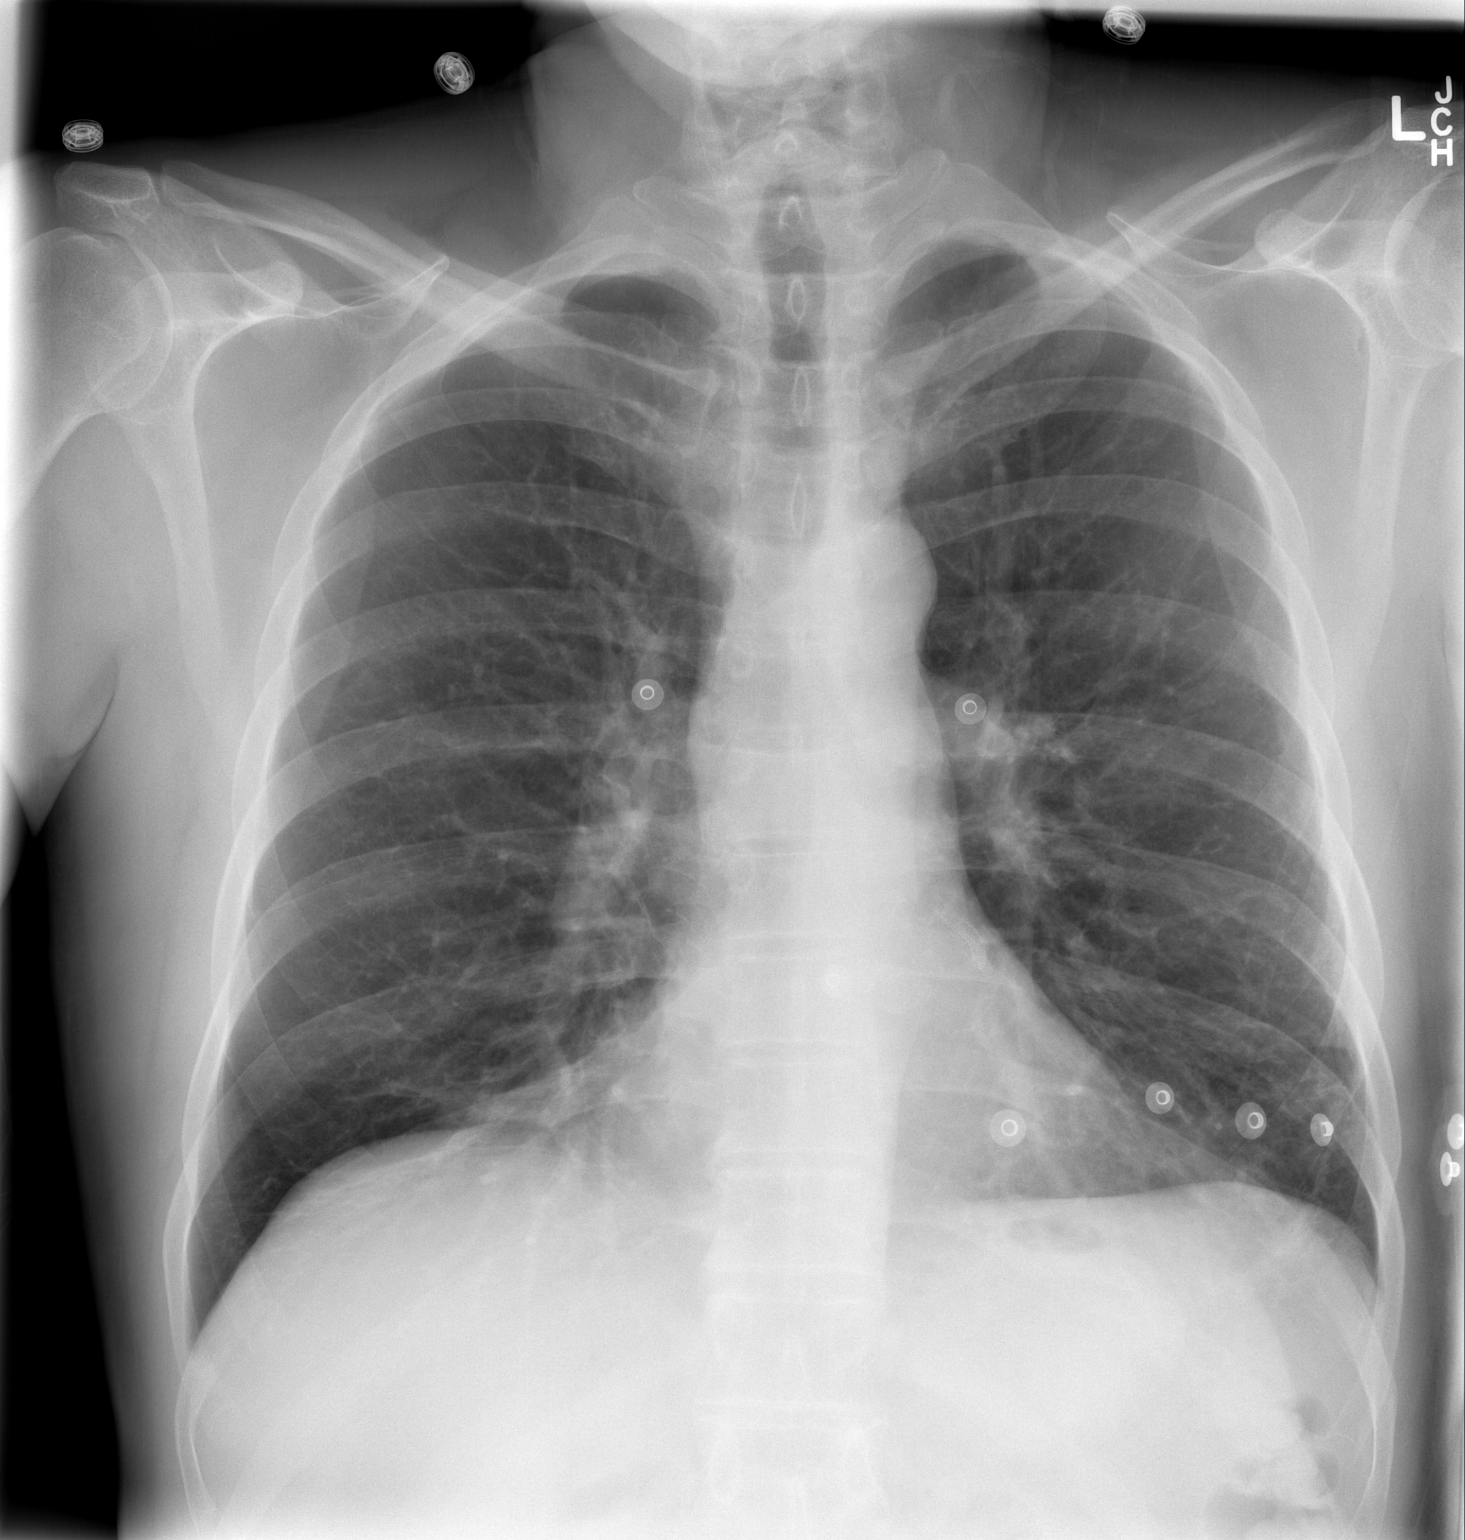

[w chest lat]
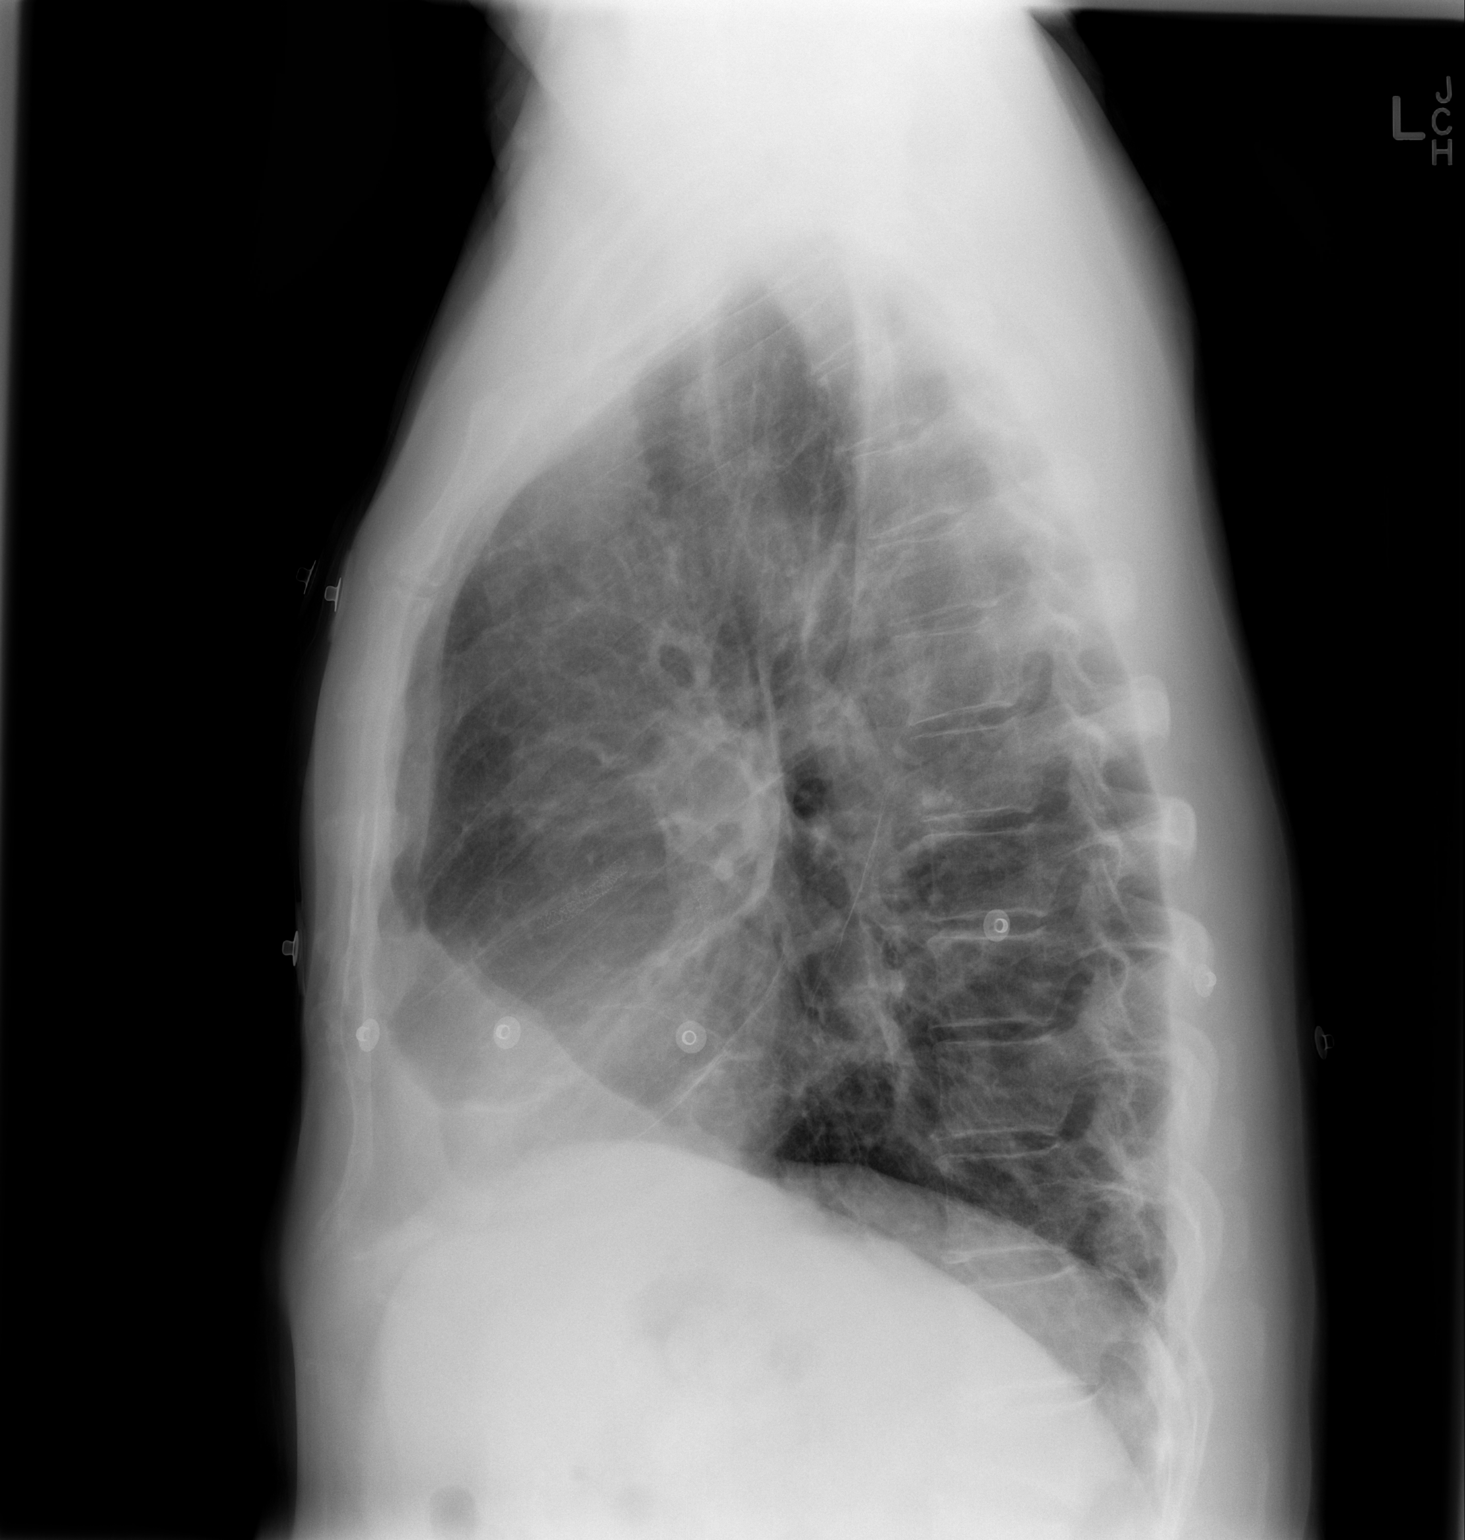

[2 of 2 positions shown; findings below may reference images not displayed]

FINDINGS: Cardiopericardial silhouette within normal limits.
Mediastinal contours normal. Trachea midline.  No airspace disease
or effusion.  Scarring is present at both lung bases, similar to
prior exam. Chronic lower thoracic compression fracture.
IMPRESSION: No active cardiopulmonary disease or interval change.  Bilateral
basilar scarring.

## 2015-09-17 ENCOUNTER — Other Ambulatory Visit: Payer: Self-pay | Admitting: *Deleted

## 2015-09-17 MED ORDER — NITROGLYCERIN 0.4 MG SL SUBL: 0.4000 mg | SUBLINGUAL_TABLET | SUBLINGUAL | Status: DC | PRN

## 2016-11-27 ENCOUNTER — Encounter: Payer: Self-pay | Admitting: Hematology

## 2016-11-27 ENCOUNTER — Telehealth: Payer: Self-pay | Admitting: Hematology

## 2016-11-27 NOTE — Telephone Encounter (Signed)
Pt cld to schedule an appt w/Dr. Irene Limbo on 5/1 at 11am. Pt aware to arrive 30 minutes early. Demographics verified. Letter mailed.

## 2016-12-31 ENCOUNTER — Ambulatory Visit (HOSPITAL_BASED_OUTPATIENT_CLINIC_OR_DEPARTMENT_OTHER): Payer: Managed Care, Other (non HMO) | Admitting: Family

## 2016-12-31 ENCOUNTER — Ambulatory Visit: Payer: Managed Care, Other (non HMO)

## 2016-12-31 ENCOUNTER — Other Ambulatory Visit: Payer: Self-pay | Admitting: Family

## 2016-12-31 ENCOUNTER — Other Ambulatory Visit (HOSPITAL_BASED_OUTPATIENT_CLINIC_OR_DEPARTMENT_OTHER): Payer: Managed Care, Other (non HMO)

## 2016-12-31 ENCOUNTER — Other Ambulatory Visit (HOSPITAL_COMMUNITY)
Admission: RE | Admit: 2016-12-31 | Discharge: 2016-12-31 | Disposition: A | Discharge status: Home / Self Care | Payer: Managed Care, Other (non HMO) | Source: Ambulatory Visit | Attending: Hematology & Oncology | Admitting: Hematology & Oncology

## 2016-12-31 ENCOUNTER — Encounter: Payer: Self-pay | Admitting: Family

## 2016-12-31 VITALS — BP 136/90 | HR 61 | Temp 97.7°F | Resp 17 | Wt 172.8 lb

## 2016-12-31 DIAGNOSIS — D751 Secondary polycythemia: Secondary | ICD-10-CM

## 2016-12-31 DIAGNOSIS — R591 Generalized enlarged lymph nodes: Secondary | ICD-10-CM

## 2016-12-31 DIAGNOSIS — D72829 Elevated white blood cell count, unspecified: Secondary | ICD-10-CM

## 2016-12-31 DIAGNOSIS — C8591 Non-Hodgkin lymphoma, unspecified, lymph nodes of head, face, and neck: Secondary | ICD-10-CM

## 2016-12-31 LAB — CBC WITH DIFFERENTIAL (CANCER CENTER ONLY)
HCT: 50 % — ABNORMAL HIGH (ref 38.7–49.9)
HEMOGLOBIN: 17.3 g/dL — AB (ref 13.0–17.1)
MCH: 33.8 pg — AB (ref 28.0–33.4)
MCHC: 34.6 g/dL (ref 32.0–35.9)
MCV: 98 fL (ref 82–98)
Platelets: 191 10*3/uL (ref 145–400)
RBC: 5.12 10*6/uL (ref 4.20–5.70)
RDW: 12.4 % (ref 11.1–15.7)
WBC: 21 10*3/uL — ABNORMAL HIGH (ref 4.0–10.0)

## 2016-12-31 LAB — LACTATE DEHYDROGENASE: LDH: 167 U/L (ref 125–245)

## 2016-12-31 LAB — MANUAL DIFFERENTIAL (CHCC SATELLITE)
ALC: 14 10*3/uL — AB (ref 0.9–3.3)
ANC (CHCC MAN DIFF): 5.9 10*3/uL (ref 1.5–6.5)
Eos: 1 % (ref 0–7)
LYMPH: 67 % — AB (ref 14–48)
MONO: 4 % (ref 0–13)
PLT EST ~~LOC~~: ADEQUATE
RBC Comments: NORMAL
SEG: 28 % — AB (ref 40–75)

## 2016-12-31 LAB — CHCC SATELLITE - SMEAR

## 2016-12-31 NOTE — Progress Notes (Signed)
Hematology/Oncology Consultation   Name: Melvin Martin      MRN: [id number]    Location: Room/bed info not found  Date: 12/31/2016 Time:1:55 PM   REFERRING PHYSICIAN: Marton Redwood, MD  REASON FOR CONSULT: Leukocytosis    DIAGNOSIS: CLL   HISTORY OF PRESENT ILLNESS: [name] is a very pleasant 61 yo caucasian male with a history of leukocytosis dating back as far as 2017. Today his WBC count is 21.0 and Hgb is 17.3. He has palpable bilateral supraclavicular and axillary lymphnodes. He admits to feeling more fatigued lately.  He receives testosterone pellets under the skin of his left hip once every 4 months. His Hgb is slightly elevated. He states that he has considered donating blood but spent a lot of time in [place] in the 1980's with possible exposure to [place] C[place] Disease. As such, the [facility] will not accept his donation.  Family cancer history includes maternal aunt with esophageal and stomach cancer and a cousin with breast cancer.  He has history of an MI several years ago with 2 stents placed. He takes an aspirin daily.  No fever, chills, n/v, cough, rash, dizziness, chest pain, palpitations, abdominal pain or changes in bowel or bladder habits.  He has occasional SOB due to asthmatic bronchitis. He has allergies that are exacerbated by the changes in weather and he states that his voice stays hoarse.  No swelling, tenderness, numbness or tingling in his extremities. No c/o pain at this time.  He states that his appetite comes and goes but he is staying well hydrated. Her denies any significant weight loss or gain.  He has never been a smoker and does not drink alcohol.  He is under a good bit of stress at home. His wife has a history of alcoholism and he has two teenage daughters one with some emotional issues.  He tries to work out for exercise but has not been doing this as much lately.    ROS: All other 10 point review of systems is negative.   PAST MEDICAL HISTORY:    Past Medical History:  Diagnosis Date  . ADD (attention deficit disorder)   . Asthmatic bronchitis   . Coronary artery disease   . GERD (gastroesophageal reflux disease)   . Hyperlipidemia LDL goal < 70   . Low testosterone   . NSTEMI (non-ST elevated myocardial infarction) December 2013   with PCI with DES to the ramus and LAD    ALLERGIES: No Known Allergies    MEDICATIONS:  Current Outpatient Prescriptions on File Prior to Visit  Medication Sig Dispense Refill  . Ascorbic Acid (VITAMIN C PO) Take 1-2 tablets by mouth daily.     Marland Kitchen aspirin 81 MG chewable tablet Chew 1 tablet (81 mg total) by mouth daily.    . CONCERTA 27 MG CR tablet     . CRESTOR 20 MG tablet Take 20 mg by mouth at bedtime.  5  . nitroGLYCERIN (NITROSTAT) 0.4 MG SL tablet Place 1 tablet (0.4 mg total) under the tongue every 5 (five) minutes as needed for chest pain. 25 tablet 0  . Omega-3 Krill Oil 500 MG CAPS Take by mouth 2 (two) times daily.    . Testosterone (TESTOPEL) 75 MG PLLT 1 each by Implant route as directed. Patient states that he receives this medication every 5 month for low testosterone 16 pellets last done at 1/30     No current facility-administered medications on file prior to visit.  PAST SURGICAL HISTORY Past Surgical History:  Procedure Laterality Date  . CORONARY ANGIOPLASTY WITH STENT PLACEMENT  08/18/2012   DES to the ramus and LAD  . Cyst removal, mouth    . LEFT HEART CATHETERIZATION WITH CORONARY ANGIOGRAM N/A 08/18/2012   Procedure: LEFT HEART CATHETERIZATION WITH CORONARY ANGIOGRAM;  Surgeon: Sherren Mocha, MD;  Location[facility] C[facility] LAB;  Service: Cardiovascular;  Laterality: N/A;  . PERCUTANEOUS CORONARY STENT INTERVENTION (PCI-S)  08/18/2012   Procedure: PERCUTANEOUS CORONARY STENT INTERVENTION (PCI-S);  Surgeon: Sherren Mocha, MD;  Location: [facility] CATH LAB;  Service: Cardiovascular;;    FAMILY HISTORY: Family History  Problem Relation Age of Onset  . Emphysema Mother    . Heart failure Father   . Heart attack Father   . Heart attack Cousin     SOCIAL HISTORY:  reports that he has never smoked. He has never used smokeless tobacco. He reports that he drinks alcohol. He reports that he does not use drugs.  PERFORMANCE STATUS: The patient's performance status is 1 - Symptomatic but completely ambulatory  PHYSICAL EXAM: Most Recent Vital Signs: Blood pressure 136/90, pulse 61, temperature 97.7 F (36.5 C), temperature source Oral, resp. rate 17, weight 172 lb 12.8 oz (78.4 kg), SpO2 97 %. BP 136/90 (BP Location: Left Arm, Patient Position: Sitting)   Pulse 61   Temp 97.7 F (36.5 C) (Oral)   Resp 17   Wt 172 lb 12.8 oz (78.4 kg)   SpO2 97%   BMI 24.79 kg/m   General Appearance:    Alert, cooperative, no distress, appears stated age  Head:    Normocephalic, without obvious abnormality, atraumatic  Eyes:    PERRL, conjunctiva/corneas clear, EOM's intact, fundi    benign, both eyes             Throat:   Lips, mucosa, and tongue normal; teeth and gums normal  Neck:   Supple, symmetrical, trachea midline, no adenopathy;       thyroid:  No enlargement/tenderness/nodules; no carotid   bruit or JVD  Back:     Symmetric, no curvature, ROM normal, no CVA tenderness  Lungs:     Clear to auscultation bilaterally, respirations unlabored  Chest wall:    No tenderness or deformity  Heart:    Regular rate and rhythm, S1 and S2 normal, no murmur, rub   or gallop  Abdomen:     Soft, non-tender, bowel sounds active all four quadrants,    no masses, no organomegaly        Extremities:   Extremities normal, atraumatic, no cyanosis or edema  Pulses:   2+ and symmetric all extremities  Skin:   Skin color, texture, turgor normal, no rashes or lesions  Lymph nodes:   No cervical node palpated, supraclavicular and axillary nodes palpable today  Neurologic:   CNII-XII intact. Normal strength, sensation and reflexes      throughout    LABORATORY DATA:  No  results found for this or any previous visit (from the past 48 hour(s)).    RADIOGRAPHY: No results found.     PATHOLOGY: None   ASSESSMENT/PLAN: Mr. Cefalu is a very pleasant 61 yo caucasian male here today with an elevated WBC count (for the last 10-11 years) at 21.0 and Hgb of 17.3. He has smudge cells present on diff along with supraclavicular and axillary lymphadenopathy. 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  We have added flow cytometry and CLL comprehensive panel testing. Once we have these results we will look  at scheduling scans and scheduling follow-up at that time.  Bone marrow biopsy will also likely be in the near future.  With his history of possible exposure to mad cows disease we will need to be cautious.    All questions for now were answered and he is in agreement with the plan. He will contact our office with any other questions or concerns. We can certainly see the patient much sooner if necessary.  He was discussed with and also seen by Dr. Marin Olp and he is in agreement with the aforementioned.   [facility]     Addendum: I saw and examined the patient with Ommie Degeorge. I agree with the above assessment. The flow cytometry will be critical.  I looked at his blood smear. He does have a majority of lymphocytes. He does have some atypical lymphocytes. Some of the lymphocytes appear to have nucleoli. As such, I wonder if he may not have prolymphocytic leukemia.  The flow cytometry will be critical. I suspect that he will need a bone marrow biopsy and CT scans.  We spent about 45 minutes with him. We reviewed the labs with him. He is not anemic or thrombocytopenic. His LDH is okay.  Again, this will come down to the flow cytometry.  Lattie Haw, MD

## 2017-01-01 LAB — HEMOGLOBINOPATHY EVALUATION

## 2017-01-01 LAB — COMPREHENSIVE METABOLIC PANEL (CC13)
ALBUMIN: 4.3 g/dL (ref 3.5–5.5)
ALK PHOS: 47 IU/L (ref 39–117)
ALT: 38 IU/L (ref 0–44)
AST (SGOT): 25 IU/L (ref 0–40)
Albumin/Globulin Ratio: 1.7 (ref 1.2–2.2)
BILIRUBIN TOTAL: 0.3 mg/dL (ref 0.0–1.2)
BUN / CREAT RATIO: 20 (ref 9–20)
BUN: 17 mg/dL (ref 6–24)
CHLORIDE: 98 mmol/L (ref 96–106)
Calcium, Ser: 9.2 mg/dL (ref 8.7–10.2)
Carbon Dioxide, Total: 24 mmol/L (ref 18–29)
Creatinine, Ser: 0.87 mg/dL (ref 0.76–1.27)
GFR calc Af Amer: 110 mL/min/{1.73_m2} (ref >59)
GFR calc non Af Amer: 95 mL/min/{1.73_m2} (ref >59)
GLUCOSE: 83 mg/dL (ref 65–99)
Globulin, Total: 2.6 g/dL (ref 1.5–4.5)
Potassium, Ser: 4.5 mmol/L (ref 3.5–5.2)
Sodium: 140 mmol/L (ref 134–144)
Total Protein: 6.9 g/dL (ref 6.0–8.5)

## 2017-01-01 LAB — IRON AND TIBC
%SAT: 50 % (ref 20–55)
Iron: 124 ug/dL (ref 42–163)
TIBC: 250 ug/dL (ref 202–409)
UIBC: 126 ug/dL (ref 117–376)

## 2017-01-01 LAB — RETICULOCYTES: Reticulocyte Count: 0.6 % (ref 0.6–2.6)

## 2017-01-01 LAB — FERRITIN: FERRITIN: 117 ng/mL (ref 22–316)

## 2017-01-05 ENCOUNTER — Other Ambulatory Visit: Payer: Self-pay | Admitting: Family

## 2017-01-05 ENCOUNTER — Encounter: Payer: Self-pay | Admitting: Hematology

## 2017-01-05 DIAGNOSIS — C9110 Chronic lymphocytic leukemia of B-cell type not having achieved remission: Secondary | ICD-10-CM

## 2017-01-06 ENCOUNTER — Other Ambulatory Visit: Payer: Self-pay | Admitting: Family

## 2017-01-06 DIAGNOSIS — C9110 Chronic lymphocytic leukemia of B-cell type not having achieved remission: Secondary | ICD-10-CM

## 2017-01-06 NOTE — Progress Notes (Signed)
CT bone

## 2017-01-07 ENCOUNTER — Encounter (HOSPITAL_BASED_OUTPATIENT_CLINIC_OR_DEPARTMENT_OTHER): Payer: Self-pay

## 2017-01-07 ENCOUNTER — Ambulatory Visit (HOSPITAL_BASED_OUTPATIENT_CLINIC_OR_DEPARTMENT_OTHER)
Admission: RE | Admit: 2017-01-07 | Discharge: 2017-01-07 | Disposition: A | Discharge status: Home / Self Care | Payer: Managed Care, Other (non HMO) | Source: Ambulatory Visit | Attending: Family | Admitting: Family

## 2017-01-07 DIAGNOSIS — N400 Enlarged prostate without lower urinary tract symptoms: Secondary | ICD-10-CM | POA: Insufficient documentation

## 2017-01-07 DIAGNOSIS — I700 Atherosclerosis of aorta: Secondary | ICD-10-CM | POA: Diagnosis not present

## 2017-01-07 DIAGNOSIS — J439 Emphysema, unspecified: Secondary | ICD-10-CM | POA: Insufficient documentation

## 2017-01-07 DIAGNOSIS — R590 Localized enlarged lymph nodes: Secondary | ICD-10-CM | POA: Diagnosis not present

## 2017-01-07 DIAGNOSIS — M4856XA Collapsed vertebra, not elsewhere classified, lumbar region, initial encounter for fracture: Secondary | ICD-10-CM | POA: Diagnosis not present

## 2017-01-07 DIAGNOSIS — C9110 Chronic lymphocytic leukemia of B-cell type not having achieved remission: Secondary | ICD-10-CM

## 2017-01-07 DIAGNOSIS — C9190 Lymphoid leukemia, unspecified not having achieved remission: Secondary | ICD-10-CM | POA: Insufficient documentation

## 2017-01-07 HISTORY — DX: Malignant (primary) neoplasm, unspecified: C80.1

## 2017-01-07 MED ORDER — IOPAMIDOL (ISOVUE-300) INJECTION 61%
100.0000 mL | Freq: Once | INTRAVENOUS | Status: AC | PRN
  Administered 2017-01-07: 100 mL via INTRAVENOUS

## 2017-01-08 ENCOUNTER — Telehealth: Payer: Self-pay | Admitting: Family

## 2017-01-08 ENCOUNTER — Other Ambulatory Visit: Payer: Self-pay | Admitting: Family

## 2017-01-08 DIAGNOSIS — J020 Streptococcal pharyngitis: Secondary | ICD-10-CM

## 2017-01-08 DIAGNOSIS — C9110 Chronic lymphocytic leukemia of B-cell type not having achieved remission: Secondary | ICD-10-CM

## 2017-01-08 LAB — FLOW CYTOMETRY - CHCC SATELLITE

## 2017-01-08 MED ORDER — AMOXICILLIN-POT CLAVULANATE 875-125 MG PO TABS: 1.0000 | ORAL_TABLET | Freq: Two times a day (BID) | ORAL | 0 refills | Status: DC

## 2017-01-08 NOTE — Telephone Encounter (Signed)
I spoke with Melvin Martin and went over his CT scan results in detail and all questions were answered. We will proceed with the bone marrow biopspy scheduled for next week. He states that his 2 daughters have strep throat and are on antibiotics. He now has a sore throat. We will go ahead and treat him with Augmentin BID for 7 days. He is in agreement with the plan. Script sent to his pharmacy in [place].

## 2017-01-11 ENCOUNTER — Other Ambulatory Visit: Payer: Self-pay | Admitting: Radiology

## 2017-01-12 ENCOUNTER — Ambulatory Visit (HOSPITAL_COMMUNITY)
Admission: RE | Admit: 2017-01-12 | Discharge: 2017-01-12 | Disposition: A | Discharge status: Home / Self Care | Payer: Managed Care, Other (non HMO) | Source: Ambulatory Visit | Attending: Family | Admitting: Family

## 2017-01-12 ENCOUNTER — Encounter (HOSPITAL_COMMUNITY): Payer: Self-pay

## 2017-01-12 ENCOUNTER — Encounter: Payer: Self-pay | Admitting: Family

## 2017-01-12 ENCOUNTER — Other Ambulatory Visit: Payer: Self-pay | Admitting: Family

## 2017-01-12 DIAGNOSIS — C9110 Chronic lymphocytic leukemia of B-cell type not having achieved remission: Secondary | ICD-10-CM

## 2017-01-12 DIAGNOSIS — C9190 Lymphoid leukemia, unspecified not having achieved remission: Secondary | ICD-10-CM | POA: Insufficient documentation

## 2017-01-12 HISTORY — DX: Chest pain, unspecified: R07.9

## 2017-01-12 LAB — CBC WITH DIFFERENTIAL/PLATELET
BASOS ABS: 0 10*3/uL (ref 0.0–0.1)
Basophils Relative: 0 %
EOS ABS: 0.8 10*3/uL — AB (ref 0.0–0.7)
Eosinophils Relative: 3 %
HEMATOCRIT: 48.8 % (ref 39.0–52.0)
Hemoglobin: 17 g/dL (ref 13.0–17.0)
LYMPHS ABS: 18.3 10*3/uL — AB (ref 0.7–4.0)
Lymphocytes Relative: 67 %
MCH: 33.7 pg (ref 26.0–34.0)
MCHC: 34.8 g/dL (ref 30.0–36.0)
MCV: 96.6 fL (ref 78.0–100.0)
MONOS PCT: 6 %
Monocytes Absolute: 1.6 10*3/uL — ABNORMAL HIGH (ref 0.1–1.0)
Neutro Abs: 6.5 10*3/uL (ref 1.7–7.7)
Neutrophils Relative %: 24 %
PLATELETS: 233 10*3/uL (ref 150–400)
RBC: 5.05 MIL/uL (ref 4.22–5.81)
RDW: 12.9 % (ref 11.5–15.5)
WBC: 27.2 10*3/uL — AB (ref 4.0–10.5)

## 2017-01-12 LAB — PROTIME-INR
INR: 0.98
PROTHROMBIN TIME: 13 s (ref 11.4–15.2)

## 2017-01-12 MED ORDER — MIDAZOLAM HCL 2 MG/2ML IJ SOLN
INTRAMUSCULAR | Status: AC | PRN
  Administered 2017-01-12: 1 mg via INTRAVENOUS

## 2017-01-12 MED ORDER — FENTANYL CITRATE (PF) 100 MCG/2ML IJ SOLN
INTRAMUSCULAR | Status: AC
  Filled 2017-01-12: qty 6

## 2017-01-12 MED ORDER — MIDAZOLAM HCL 2 MG/2ML IJ SOLN
INTRAMUSCULAR | Status: AC
  Filled 2017-01-12: qty 6

## 2017-01-12 MED ORDER — FENTANYL CITRATE (PF) 100 MCG/2ML IJ SOLN
INTRAMUSCULAR | Status: AC | PRN
  Administered 2017-01-12: 50 ug via INTRAVENOUS

## 2017-01-12 MED ORDER — NALOXONE HCL 0.4 MG/ML IJ SOLN
INTRAMUSCULAR | Status: AC
  Filled 2017-01-12: qty 1

## 2017-01-12 MED ORDER — FLUMAZENIL 0.5 MG/5ML IV SOLN
INTRAVENOUS | Status: AC
  Filled 2017-01-12: qty 5

## 2017-01-12 MED ORDER — SODIUM CHLORIDE 0.9 % IV SOLN
INTRAVENOUS | Status: DC
  Administered 2017-01-12: 07:00:00 via INTRAVENOUS

## 2017-01-12 NOTE — Consult Note (Signed)
Chief Complaint: Patient was seen in consultation today for CT guided bone marrow biopsy  Referring Physician(s): [place] M/Ennever,P  Supervising Physician: Arne Cleveland  Patient Status: [facility] - Out-pt  History of Present Illness: Melvin Martin is a 61 y.o. male with history of persistent leukocytosis with monoclonal B-cell population noted on flow cytometry with differential diagnosis of CLL/mantle cell lymphoma. He presents today for CT-guided bone marrow biopsy for further evaluation.  Past Medical History:  Diagnosis Date  . ADD (attention deficit disorder)   . Asthmatic bronchitis   . Cancer ([place])   . Chest pain    pt relates to GERDS and asthmatic bronchitis  . Coronary artery disease   . GERD (gastroesophageal reflux disease)   . Hyperlipidemia LDL goal < 70   . Low testosterone   . NSTEMI (non-ST elevated myocardial infarction) [facility]) December 2013   with PCI with DES to the ramus and LAD    Past Surgical History:  Procedure Laterality Date  . CORONARY ANGIOPLASTY WITH STENT PLACEMENT  08/18/2012   DES to the ramus and LAD  . Cyst removal, mouth    . LEFT HEART CATHETERIZATION WITH CORONARY ANGIOGRAM N/A 08/18/2012   Procedure: LEFT HEART CATHETERIZATION WITH CORONARY ANGIOGRAM;  Surgeon: Sherren Mocha, MD;  Location: [facility] CATH LAB;  Service: Cardiovascular;  Laterality: N/A;  . PERCUTANEOUS CORONARY STENT INTERVENTION (PCI-S)  08/18/2012   Procedure: PERCUTANEOUS CORONARY STENT INTERVENTION (PCI-S);  Surgeon: Sherren Mocha, MD;  Location: [facility] CATH LAB;  Service: Cardiovascular;;    Allergies: Metoprolol  Medications: Prior to Admission medications   Medication Sig Start Date End Date Taking? Authorizing Provider  amoxicillin-clavulanate (AUGMENTIN) 875-125 MG tablet Take 1 tablet by mouth 2 (two) times daily. 01/08/17  Yes [place], Holli Humbles, NP  Ascorbic Acid (VITAMIN C PO) Take 1-2 tablets by mouth daily.    Yes [provider]  aspirin  81 MG chewable tablet Chew 1 tablet (81 mg total) by mouth daily. 08/19/12  Yes Barrett, Evelene Croon, PA-C  CONCERTA 27 MG CR tablet  06/21/14  Yes [provider]  CRESTOR 20 MG tablet Take 10 mg by mouth daily.  07/17/14  Yes [provider]  fluticasone furoate-vilanterol (BREO ELLIPTA) 200-25 MCG/INH AEPB Inhale 1 puff into the lungs daily.   Yes [provider]  Lactobacillus Rhamnosus, GG, (CULTURELLE PO) Take 1 tablet by mouth as needed.   Yes [provider]  Multiple Vitamin (MULTIVITAMIN) tablet Take 1 tablet by mouth daily.   Yes [provider]  Omega-3 Krill Oil 500 MG CAPS Take by mouth 2 (two) times daily.   Yes [provider]  omeprazole (PRILOSEC) 40 MG capsule Take 40 mg by mouth daily.   Yes [provider]  Testosterone (TESTOPEL) 75 MG PLLT 1 each by Implant route as directed. Patient states that he receives this medication every 5 month for low testosterone 16 pellets last done at 1/30   Yes [provider]  nitroGLYCERIN (NITROSTAT) 0.4 MG SL tablet Place 1 tablet (0.4 mg total) under the tongue every 5 (five) minutes as needed for chest pain. 09/17/15   [name], [name], MD     Family History  Problem Relation Age of Onset  . Emphysema Mother   . Heart failure Father   . Heart attack Father   . Heart attack Cousin     Social History   Social History  . Marital status: Married    Spouse name: N/A  . Number  of children: N/A  . Years of education: N/A   Social History Main Topics  . Smoking status: Never Smoker  . Smokeless tobacco: Never Used  . Alcohol use No  . Drug use: No  . Sexual activity: Yes   Other Topics Concern  . None   Social History Narrative   Lives with wife and 2 small children      Review of Systems currently denies fever, headache, chest pain, abdominal pain, nausea, vomiting or abnormal bleeding. He does have some dyspnea with exertion, occasional cough, some sinus  congestion and occasional back pain.  Vital Signs: BP (!) 136/92   Pulse 73   Temp 97.7 F (36.5 C) (Oral)   Resp 16   Ht 5' 10.5" (1.791 m)   Wt 178 lb (80.7 kg)   SpO2 99%   BMI 25.18 kg/m   Physical Exam awake, alert. Chest clear to auscultation bilaterally. Heart with regular rate and rhythm. Abdomen soft, positive bowel sounds, nontender. No lower extremity edema.  Mallampati Score:     Imaging: Ct Soft Tissue Neck W Contrast  Result Date: 01/07/2017 CLINICAL DATA:  Chronic lymphocytic leukemia. EXAM: CT NECK WITH CONTRAST TECHNIQUE: Multidetector CT imaging of the neck was performed using the standard protocol following the bolus administration of intravenous contrast. CONTRAST:  ISOVUE-300 IOPAMIDOL (ISOVUE-300) INJECTION 61% COMPARISON:  None. FINDINGS: Pharynx and larynx: No mucosal or submucosal lesion is seen. Salivary glands: Parotid and submandibular glands are intrinsically normal. Adjacent adenopathy as below. Thyroid: Normal Lymph nodes: Enlargement of a every lymph node within the region, all nodal stations bilaterally. Index nodes are as follows. Level 1 node superficial to the submandibular gland on the left image 59 short axis dimension 9.5 mm. Right level 2 node image 60 short axis diameter 11.9 mm. Left level 2 node image 63 short axis diameter 10.8 mm. Right level 3 node at the level of the hyoid image 71 short axis diameter 9.6 mm. Left supraclavicular node image 84 short axis diameter 8.7 mm. In the pre epiglottic space, there is an ovoid mass likely to represent an enlarged lymph node in this location. This measures 6.7 x 12.3 mm. It does not extend to a mucosal surface and is not associated with any bone or cartilage destruction in therefore is unlikely to be a squamous cell lesion. Vascular: Normal Limited intracranial: No acquired pathology. Incidental mega cisterna magna. Visualized orbits: Normal Mastoids and visualized paranasal sinuses: Mild maxillary  sinus mucosal thickening on the right, possibly subclinical. Skeleton: Ordinary cervical spondylosis. Upper chest: See results of chest CT. Other: None IMPRESSION: Diffuse nodal enlargement bilaterally throughout the neck, all nodes an all nodal stations. Index nodes measured as above for follow-up. Ovoid entity in the pre epiglottic fat centrally and towards the left measuring 6.7 x 12.3 mm likely to represent an enlarged node in this location. This is an unusual location, but in this patient with diffuse nodal enlargement, lymph node remains by far the most likely diagnosis. See above discussion. Electronically Signed   By: Paulina Fusi M.D.   On: 01/07/2017 13:00   Ct Chest W Contrast  Result Date: 01/07/2017 CLINICAL DATA:  New diagnosis of chronic lymphocytic leukemia. Leukocytosis. Fatigue and shortness of breath. EXAM: CT CHEST, ABDOMEN, AND PELVIS WITH CONTRAST TECHNIQUE: Multidetector CT imaging of the chest, abdomen and pelvis was performed following the standard protocol during bolus administration of intravenous contrast. CONTRAST:  ISOVUE-300 IOPAMIDOL (ISOVUE-300) INJECTION 61% COMPARISON:  Chest radiograph 08/13/2014 FINDINGS: CT  CHEST FINDINGS Cardiovascular: Coronary artery atherosclerosis with coronary artery stents. Heart size within normal limits. Diminutive left vertebral artery. Mediastinum/Nodes: Bilateral axillary and subpectoral adenopathy with scattered primarily small mediastinal lymph nodes. A right axillary lymph node measures 1.3 cm in short axis on image 16 of series 3. Lungs/Pleura: Centrilobular emphysema. 4 mm pulmonary nodule anteriorly in the right middle lobe, image 42/4. 4 mm right upper lobe pulmonary nodule on image 24/4. 3 mm left upper lobe pulmonary nodule also on image 24/4. 4 mm subpleural nodule in the left lower lobe on image 41/4. Musculoskeletal: Unremarkable CT ABDOMEN PELVIS FINDINGS Hepatobiliary: Unremarkable Pancreas: Unremarkable Spleen: Unremarkable.  The spleen measures 11.8 by 6.0 by 6.7 cm (volume = 250 cm^3). Adrenals/Urinary Tract: Unremarkable Stomach/Bowel: Unremarkable Vascular/Lymphatic: Mild aortoiliac atherosclerotic vascular disease. Numerous scattered lymph nodes in the gastrohepatic ligament, porta hepatis, mesentery, periaortic, iliac, and inguinal chains noted. Many of these are not individually enlarged but are increased in number substantially. A peripancreatic node measures 1.5 cm in short axis on image 65/3. Portacaval node 2.2 cm in short axis, image 69/3. Left common iliac node 1.3 cm in short axis, image 98/3. Right external iliac node 1.6 cm in short axis, image 119/3. Index mesenteric node 1 cm in short axis, image 91/3. Reproductive: Enlarged prostate gland, measuring 6.9 by 4.4 by 5.4 cm (volume = 86 cm^3). Other: No supplemental non-categorized findings. Musculoskeletal: 45% anterior compression fracture at L1, chronic imaging characteristics, with mild kyphotic angulation at the T12-L1 level. Degenerative facet arthropathy at L5-S1. Small umbilical hernia contains adipose tissue. IMPRESSION: 1. Numerous scattered axillary, subpectoral, periaortic, porta hepatis, mesenteric, iliac, and inguinal lymph nodes, abnormally elevated in number and likely a manifestation of chronic lymphocytic leukemia. Representative enlarged index nodes are documented above. No splenomegaly. 2. Aortic Atherosclerosis (ICD10-I70.0) and Emphysema (ICD10-J43.9). 3. Several pulmonary nodules in the 3-4 mm range are technically nonspecific but may warrant surveillance. Standard imaging follow up guidelines do not apply given the history of malignancy. 4. Enlarged prostate gland measured at 86 cubic cm volume. 5. Chronic 45% anterior compression fracture at L1 with mild kyphotic angulation at the T12-L1 level. Electronically Signed   By: Van Clines M.D.   On: 01/07/2017 13:58   Ct Abdomen Pelvis W Contrast  Result Date: 01/07/2017 CLINICAL DATA:  New  diagnosis of chronic lymphocytic leukemia. Leukocytosis. Fatigue and shortness of breath. EXAM: CT CHEST, ABDOMEN, AND PELVIS WITH CONTRAST TECHNIQUE: Multidetector CT imaging of the chest, abdomen and pelvis was performed following the standard protocol during bolus administration of intravenous contrast. CONTRAST:  124m ISOVUE-300 IOPAMIDOL (ISOVUE-300) INJECTION 61% COMPARISON:  Chest radiograph 08/13/2014 FINDINGS: CT CHEST FINDINGS Cardiovascular: Coronary artery atherosclerosis with coronary artery stents. Heart size within normal limits. Diminutive left vertebral artery. Mediastinum/Nodes: Bilateral axillary and subpectoral adenopathy with scattered primarily small mediastinal lymph nodes. A right axillary lymph node measures 1.3 cm in short axis on image 16 of series 3. Lungs/Pleura: Centrilobular emphysema. 4 mm pulmonary nodule anteriorly in the right middle lobe, image 42/4. 4 mm right upper lobe pulmonary nodule on image 24/4. 3 mm left upper lobe pulmonary nodule also on image 24/4. 4 mm subpleural nodule in the left lower lobe on image 41/4. Musculoskeletal: Unremarkable CT ABDOMEN PELVIS FINDINGS Hepatobiliary: Unremarkable Pancreas: Unremarkable Spleen: Unremarkable. The spleen measures 11.8 by 6.0 by 6.7 cm (volume = 250 cm^3). Adrenals/Urinary Tract: Unremarkable Stomach/Bowel: Unremarkable Vascular/Lymphatic: Mild aortoiliac atherosclerotic vascular disease. Numerous scattered lymph nodes in the gastrohepatic ligament, porta hepatis, mesentery, periaortic, iliac, and inguinal chains noted.  Many of these are not individually enlarged but are increased in number substantially. A peripancreatic node measures 1.5 cm in short axis on image 65/3. Portacaval node 2.2 cm in short axis, image 69/3. Left common iliac node 1.3 cm in short axis, image 98/3. Right external iliac node 1.6 cm in short axis, image 119/3. Index mesenteric node 1 cm in short axis, image 91/3. Reproductive: Enlarged prostate gland,  measuring 6.9 by 4.4 by 5.4 cm (volume = 86 cm^3). Other: No supplemental non-categorized findings. Musculoskeletal: 45% anterior compression fracture at L1, chronic imaging characteristics, with mild kyphotic angulation at the T12-L1 level. Degenerative facet arthropathy at L5-S1. Small umbilical hernia contains adipose tissue. IMPRESSION: 1. Numerous scattered axillary, subpectoral, periaortic, porta hepatis, mesenteric, iliac, and inguinal lymph nodes, abnormally elevated in number and likely a manifestation of chronic lymphocytic leukemia. Representative enlarged index nodes are documented above. No splenomegaly. 2. Aortic Atherosclerosis (ICD10-I70.0) and Emphysema (ICD10-J43.9). 3. Several pulmonary nodules in the 3-4 mm range are technically nonspecific but may warrant surveillance. Standard imaging follow up guidelines do not apply given the history of malignancy. 4. Enlarged prostate gland measured at 86 cubic cm volume. 5. Chronic 45% anterior compression fracture at L1 with mild kyphotic angulation at the T12-L1 level. Electronically Signed   By: Van Clines M.D.   On: 01/07/2017 13:58    Labs:  CBC:  Recent Labs  12/31/16 1317 01/12/17 0712  WBC 21.0* 27.2*  HGB 17.3* 17.0  HCT 50.0* 48.8  PLT 191 233    COAGS:  Recent Labs  01/12/17 0712  INR 0.98    BMP:  Recent Labs  12/31/16 1317  NA 140  K 4.5  CL 98  CO2 24  GLUCOSE 83  BUN 17  CALCIUM 9.2  CREATININE 0.87  GFRNONAA 95  GFRAA 110    LIVER FUNCTION TESTS:  Recent Labs  12/31/16 1317  BILITOT 0.3  AST 25  ALT 38  ALKPHOS 47  PROT 6.9  ALBUMIN 4.3    TUMOR MARKERS: No results for input(s): AFPTM, CEA, CA199, CHROMGRNA in the last 8760 hours.  Assessment and Plan: 61 y.o. male with history of persistent leukocytosis with monoclonal B-cell population noted on flow cytometry with differential diagnosis of CLL/mantle cell lymphoma. He presents today for CT-guided bone marrow biopsy for  further evaluation.Risks and benefits discussed with the patient including, but not limited to bleeding, infection, damage to adjacent structures or low yield requiring additional tests. All of the patient's questions were answered, patient is agreeable to proceed. Consent signed and in chart.     Thank you for this interesting consult.  I greatly enjoyed meeting Melvin Martin and look forward to participating in their care.  A copy of this report was sent to the requesting provider on this date.  Electronically Signed: D. Rowe Robert 01/12/2017, 8:54 AM   I spent a total of  20 minutes   in face to face in clinical consultation, greater than 50% of which was counseling/coordinating care for CT-guided bone marrow biopsy

## 2017-01-12 NOTE — Procedures (Signed)
CT-guided  R iliac bone marrow aspiration and core biopsy No complication No blood loss. See complete dictation in [facility] PACS  

## 2017-01-12 NOTE — Discharge Instructions (Signed)
Moderate Conscious Sedation, Adult, Care After These instructions provide you with information about caring for yourself after your procedure. Your health care provider may also give you more specific instructions. Your treatment has been planned according to current medical practices, but problems sometimes occur. Call your health care provider if you have any problems or questions after your procedure. What can I expect after the procedure? After your procedure, it is common:  To feel sleepy for several hours.  To feel clumsy and have poor balance for several hours.  To have poor judgment for several hours.  To vomit if you eat too soon. Follow these instructions at home: For at least 24 hours after the procedure:    Do not:  Participate in activities where you could fall or become injured.  Drive.  Use heavy machinery.  Drink alcohol.  Take sleeping pills or medicines that cause drowsiness.  Make important decisions or sign legal documents.  Take care of children on your own.  Rest. Eating and drinking   Follow the diet recommended by your health care provider.  If you vomit:  Drink water, juice, or soup when you can drink without vomiting.  Make sure you have little or no nausea before eating solid foods. General instructions   Have a responsible adult stay with you until you are awake and alert.  Take over-the-counter and prescription medicines only as told by your health care provider.  If you smoke, do not smoke without supervision.  Keep all follow-up visits as told by your health care provider. This is important. Contact a health care provider if:  You keep feeling nauseous or you keep vomiting.  You feel light-headed.  You develop a rash.  You have a fever. Get help right away if:  You have trouble breathing. This information is not intended to replace advice given to you by your health care provider. Make sure you discuss any questions you  have with your health care provider. Document Released: 06/14/2013 Document Revised: 01/27/2016 Document Reviewed: 12/14/2015 Elsevier Interactive Patient Education  2017 [place]. Bone Marrow Aspiration and Bone Marrow Biopsy, Adult, Care After This sheet gives you information about how to care for yourself after your procedure. Your health care provider may also give you more specific instructions. If you have problems or questions, contact your health care provider. What can I expect after the procedure? After the procedure, it is common to have:  Mild pain and tenderness.  Swelling.  Bruising. Follow these instructions at home:  Take over-the-counter or prescription medicines only as told by your health care provider.  Do not take baths, swim, or use a hot tub until your health care provider approves. Ask if you can take a shower or have a sponge bath.  Follow instructions from your health care provider about how to take care of the puncture site. Make sure you:  Wash your hands with soap and water before you change your bandage (dressing). If soap and water are not available, use hand sanitizer.  Change your dressing as told by your health care provider.  Check your puncture siteevery day for signs of infection. Check for:  More redness, swelling, or pain.  More fluid or blood.  Warmth.  Pus or a bad smell.  Return to your normal activities as told by your health care provider. Ask your health care provider what activities are safe for you.  Do not drive for 24 hours if you were given a medicine to help you relax (  sedative). °· Keep all follow-up visits as told by your health care provider. This is important. °Contact a health care provider if: °· You have more redness, swelling, or pain around the puncture site. °· You have more fluid or blood coming from the puncture site. °· Your puncture site feels warm to the touch. °· You have pus or a bad smell coming from the  puncture site. °· You have a fever. °· Your pain is not controlled with medicine. °This information is not intended to replace advice given to you by your health care provider. Make sure you discuss any questions you have with your health care provider. °Document Released: 03/13/2005 Document Revised: 03/13/2016 Document Reviewed: 02/05/2016 °Elsevier Interactive Patient Education © 2017 [employer] ° °

## 2017-01-19 ENCOUNTER — Encounter: Payer: Self-pay | Admitting: Hematology

## 2017-01-21 LAB — CHROMOSOME ANALYSIS, BONE MARROW

## 2017-01-21 LAB — TISSUE HYBRIDIZATION (BONE MARROW)-NCBH

## 2017-01-28 ENCOUNTER — Ambulatory Visit (HOSPITAL_BASED_OUTPATIENT_CLINIC_OR_DEPARTMENT_OTHER): Payer: Managed Care, Other (non HMO) | Admitting: Hematology & Oncology

## 2017-01-28 VITALS — BP 126/87 | HR 85 | Temp 97.8°F | Resp 18 | Wt 174.1 lb

## 2017-01-28 DIAGNOSIS — C9110 Chronic lymphocytic leukemia of B-cell type not having achieved remission: Secondary | ICD-10-CM | POA: Diagnosis not present

## 2017-01-28 NOTE — Progress Notes (Signed)
Hematology and Oncology Follow Up Visit  Melvin Martin [id number] February 23, 1956 61 y.o. 01/28/2017   Principle Diagnosis:   CLL - Rai Stage I  Current Therapy:    Observation     Interim History:  Melvin Martin is back for his second office visit. We first saw him on April 26. At that time, I felt that he had CLL. This was proven to be the case by flow cytometry.  We then did some studies on him. He had a CT scan done. This was done on May 3. This showed numerous scattered enlarged lymph nodes. There was no bulky adenopathy. He had some pulmonary nodules that were 3 have a 4 mm in size. Prostate was slightly enlarged.  He then had a bone marrow biopsy. This was done on May 8. The bone marrow report ([id number]) should CLL.  Cytogenetic studies were normal. 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  He does not have any anemia or thrombocytopenia. He really has not had any type of symptoms. He's had no fever. He's had no sweats. He does have some arthralgias. He's had no rashes. He's had no change in bowel or bladder habits.  Overall, his performance status is ECOG 0.  Medications:  Current Outpatient Prescriptions:  .  amoxicillin-clavulanate (AUGMENTIN) 875-125 MG tablet, Take 1 tablet by mouth 2 (two) times daily., Disp: 14 tablet, Rfl: 0 .  Ascorbic Acid (VITAMIN C PO), Take 1-2 tablets by mouth daily. , Disp: , Rfl:  .  aspirin 81 MG chewable tablet, Chew 1 tablet (81 mg total) by mouth daily., Disp: , Rfl:  .  CONCERTA 27 MG CR tablet, , Disp: , Rfl:  .  CRESTOR 20 MG tablet, Take 10 mg by mouth daily. , Disp: , Rfl: 5 .  fluticasone furoate-vilanterol (BREO ELLIPTA) 200-25 MCG/INH AEPB, Inhale 1 puff into the lungs daily., Disp: , Rfl:  .  Lactobacillus Rhamnosus, GG, (CULTURELLE PO), Take 1 tablet by mouth as needed., Disp: , Rfl:  .  Multiple Vitamin (MULTIVITAMIN) tablet, Take 1 tablet by mouth daily., Disp: , Rfl:  .  nitroGLYCERIN (NITROSTAT) 0.4 MG SL tablet, Place 1  tablet (0.4 mg total) under the tongue every 5 (five) minutes as needed for chest pain., Disp: 25 tablet, Rfl: 0 .  Omega-3 Krill Oil 500 MG CAPS, Take by mouth 2 (two) times daily., Disp: , Rfl:  .  omeprazole (PRILOSEC) 40 MG capsule, Take 40 mg by mouth daily., Disp: , Rfl:  .  Testosterone (TESTOPEL) 75 MG PLLT, 1 each by Implant route as directed. Patient states that he receives this medication every 5 month for low testosterone 16 pellets last done at 1/30, Disp: , Rfl:   Allergies:  Allergies  Allergen Reactions  . Metoprolol Shortness Of Breath    Caused BP to increase     Past Medical History, Surgical history, Social history, and Family History were reviewed and updated.  Review of Systems:  As above  Physical Exam:  weight is 174 lb 1.3 oz (79 kg). His oral temperature is 97.8 F (36.6 C). His blood pressure is 126/87 and his pulse is 85. His respiration is 18 and oxygen saturation is 97%.   Wt Readings from Last 3 Encounters:  01/28/17 174 lb 1.3 oz (79 kg)  01/12/17 178 lb (80.7 kg)  12/31/16 172 lb 12.8 oz (78.4 kg)      Well-developed and well-nourished white male in no obvious distress. Head and neck exam shows no  ocular or oral lesions. There are no palpable cervical or supraclavicular lymph nodes.  There may be some slight fullness in the posterior cervical lymph node chains bilaterally. Lungs are clear bilaterally. Cardiac exam regular rate and rhythm with no murmurs, rubs or bruits. Abdomen is soft. He has good bowel sounds. There is no fluid wave. There is no inguinal adenopathy bilaterally. He has no palpable liver or spleen tip. Axillary exam shows a possible left axillary lymph node. This measures about 1 cm. It is mobile and nontender. There may be some slight fullness in the right axilla. Back exam shows no tenderness over the spine, ribs or hips. Extremities shows no clubbing, cyanosis or edema. Neurological exam shows no focal neurological deficits. Skin exam  shows no rashes, ecchymoses or petechia.  Lab Results  Component Value Date   WBC 27.2 (H) 01/12/2017   HGB 17.0 01/12/2017   HCT 48.8 01/12/2017   MCV 96.6 01/12/2017   PLT 233 01/12/2017     Chemistry      Component Value Date/Time   NA 140 12/31/2016 1317   K 4.5 12/31/2016 1317   CL 98 12/31/2016 1317   CO2 24 12/31/2016 1317   BUN 17 12/31/2016 1317   CREATININE 0.87 12/31/2016 1317      Component Value Date/Time   CALCIUM 9.2 12/31/2016 1317   ALKPHOS 47 12/31/2016 1317   AST 25 12/31/2016 1317   ALT 38 12/31/2016 1317   BILITOT 0.3 12/31/2016 1317         Impression and Plan: Melvin Martin is a 61 year old white male. He has early stage-Rai I -CLL.  I just do not see an indication that we have to treat him. I just don't feel that we would be impacting his prognosis right now.  His FISH analysis does not show any adverse chromosomal changes.  We will go ahead and make a referral out to [facility]. I will like to have him see Dr. Dorothea Ogle, who is their CLL expert. I think her input would be valuable.  From my point of view, I would follow him every 3 months for right now. We certainly have a good baseline evaluation that we can use in the future. Any changes we can be able to tell easily.  I spent about 45 minutes with him. I reviewed his labs, bone marrow and CT report. I explained why I thought that he did not need to be treated right now.  He is "leaning" to not having any therapy. However, he would feel better if he did have a second opinion. I will make this happen.   Volanda Napoleon, MD 5/24/20186:17 PM

## 2017-01-29 ENCOUNTER — Encounter: Payer: Self-pay | Admitting: Hematology & Oncology

## 2017-01-29 ENCOUNTER — Telehealth: Payer: Self-pay | Admitting: *Deleted

## 2017-01-29 NOTE — Telephone Encounter (Signed)
Dr. Marin Olp would like to make a referral to Dr. Lanette Hampshire at [facility].  Call made to office, medical records faxed to office.  Dr. Dorothea Ogle will assess and determine if patient will be seen.

## 2017-02-02 ENCOUNTER — Encounter: Payer: Self-pay | Admitting: Hematology & Oncology

## 2017-02-24 LAB — GENARRAY MOLECULAR KARYOTYPING FOR CLL

## 2017-05-25 ENCOUNTER — Other Ambulatory Visit (HOSPITAL_COMMUNITY): Payer: Self-pay | Admitting: Respiratory Therapy

## 2017-05-25 DIAGNOSIS — R0600 Dyspnea, unspecified: Secondary | ICD-10-CM

## 2017-06-01 ENCOUNTER — Encounter: Payer: Self-pay | Admitting: Family

## 2017-06-04 ENCOUNTER — Ambulatory Visit (HOSPITAL_COMMUNITY)
Admission: RE | Admit: 2017-06-04 | Discharge: 2017-06-04 | Disposition: A | Discharge status: Home / Self Care | Payer: Managed Care, Other (non HMO) | Source: Ambulatory Visit | Attending: Internal Medicine | Admitting: Internal Medicine

## 2017-06-04 DIAGNOSIS — R0600 Dyspnea, unspecified: Secondary | ICD-10-CM | POA: Insufficient documentation

## 2017-06-04 MED ORDER — ALBUTEROL SULFATE (2.5 MG/3ML) 0.083% IN NEBU
2.5000 mg | INHALATION_SOLUTION | Freq: Once | RESPIRATORY_TRACT | Status: AC
Start: 2017-06-04 — End: 2017-06-04
  Administered 2017-06-04: 2.5 mg via RESPIRATORY_TRACT

## 2017-06-06 LAB — PULMONARY FUNCTION TEST
DL/VA % PRED: 63 %
DL/VA: 2.93 ml/min/mmHg/L
DLCO unc % pred: 46 %
DLCO unc: 15 ml/min/mmHg
FEF 25-75 POST: 0.71 L/s
FEF 25-75 Pre: 0.54 L/sec
FEF2575-%CHANGE-POST: 31 %
FEF2575-%Pred-Post: 23 %
FEF2575-%Pred-Pre: 17 %
FEV1-%Change-Post: 17 %
FEV1-%Pred-Post: 53 %
FEV1-%Pred-Pre: 45 %
FEV1-PRE: 1.67 L
FEV1-Post: 1.96 L
FEV1FVC-%CHANGE-POST: 9 %
FEV1FVC-%PRED-PRE: 62 %
FEV6-%Change-Post: 6 %
FEV6-%PRED-PRE: 67 %
FEV6-%Pred-Post: 71 %
FEV6-Post: 3.29 L
FEV6-Pre: 3.08 L
FEV6FVC-%Change-Post: 0 %
FEV6FVC-%Pred-Post: 91 %
FEV6FVC-%Pred-Pre: 92 %
FVC-%CHANGE-POST: 7 %
FVC-%PRED-PRE: 73 %
FVC-%Pred-Post: 78 %
FVC-POST: 3.77 L
FVC-PRE: 3.51 L
Post FEV1/FVC ratio: 52 %
Post FEV6/FVC ratio: 87 %
Pre FEV1/FVC ratio: 48 %
Pre FEV6/FVC Ratio: 88 %
RV % pred: 197 %
RV: 4.38 L
TLC % pred: 115 %
TLC: 8.05 L

## 2017-06-14 ENCOUNTER — Encounter: Payer: Self-pay | Admitting: Family

## 2017-06-16 ENCOUNTER — Encounter: Payer: Self-pay | Admitting: Pulmonary Disease

## 2017-06-16 ENCOUNTER — Other Ambulatory Visit: Payer: Managed Care, Other (non HMO)

## 2017-06-16 ENCOUNTER — Ambulatory Visit (INDEPENDENT_AMBULATORY_CARE_PROVIDER_SITE_OTHER): Payer: Managed Care, Other (non HMO) | Admitting: Pulmonary Disease

## 2017-06-16 VITALS — BP 118/78 | HR 88 | Ht 70.0 in | Wt 171.0 lb

## 2017-06-16 DIAGNOSIS — J432 Centrilobular emphysema: Secondary | ICD-10-CM

## 2017-06-16 NOTE — Patient Instructions (Signed)
Lab tests today  Will arrange for overnight oxygen test  Follow up in 6 weeks with Dr. Halford Chessman or Nurse Practitioner

## 2017-06-16 NOTE — Progress Notes (Signed)
   Subjective:    Patient ID: Melvin Martin, male    DOB: Oct 04, 1955, 61 y.o.   MRN: [id number]  HPI    Review of Systems  Constitutional: Negative for fever and unexpected weight change.  HENT: Positive for postnasal drip. Negative for congestion, dental problem, ear pain, nosebleeds, rhinorrhea, sinus pressure, sneezing, sore throat and trouble swallowing.   Eyes: Negative for redness and itching.  Respiratory: Positive for chest tightness, shortness of breath and wheezing. Negative for cough.   Cardiovascular: Negative for palpitations and leg swelling.  Gastrointestinal: Negative for nausea and vomiting.  Genitourinary: Negative for dysuria.  Musculoskeletal: Negative for joint swelling.  Skin: Negative for rash.  Allergic/Immunologic: Negative.  Negative for environmental allergies, food allergies and immunocompromised state.  Neurological: Negative for headaches.  Hematological: Does not bruise/bleed easily.  Psychiatric/Behavioral: Negative for dysphoric mood. The patient is not nervous/anxious.        Objective:   Physical Exam        Assessment & Plan:

## 2017-06-16 NOTE — Progress Notes (Signed)
Past surgical history He  has a past surgical history that includes Cyst removal, mouth; Coronary angioplasty with stent (08/18/2012); left heart catheterization with coronary angiogram (N/A, 08/18/2012); and percutaneous coronary stent intervention (pci-s) (08/18/2012).  Family history His family history includes Emphysema in his mother; Heart attack in his cousin and father; Heart failure in his father.  Social history He  reports that he has never smoked. He has never used smokeless tobacco. He reports that he does not drink alcohol or use drugs.  Allergies  Allergen Reactions  . Metoprolol Shortness Of Breath    Caused BP to increase     Current Outpatient Prescriptions on File Prior to Visit  Medication Sig  . amoxicillin-clavulanate (AUGMENTIN) 875-125 MG tablet Take 1 tablet by mouth 2 (two) times daily.  . Ascorbic Acid (VITAMIN C PO) Take 1-2 tablets by mouth daily.   Marland Kitchen aspirin 81 MG chewable tablet Chew 1 tablet (81 mg total) by mouth daily.  . CONCERTA 27 MG CR tablet   . CRESTOR 20 MG tablet Take 10 mg by mouth daily.   . fluticasone furoate-vilanterol (BREO ELLIPTA) 200-25 MCG/INH AEPB Inhale 1 puff into the lungs daily.  . Lactobacillus Rhamnosus, GG, (CULTURELLE PO) Take 1 tablet by mouth as needed.  . Multiple Vitamin (MULTIVITAMIN) tablet Take 1 tablet by mouth daily.  . nitroGLYCERIN (NITROSTAT) 0.4 MG SL tablet Place 1 tablet (0.4 mg total) under the tongue every 5 (five) minutes as needed for chest pain.  . Omega-3 Krill Oil 500 MG CAPS Take by mouth 2 (two) times daily.  Marland Kitchen omeprazole (PRILOSEC) 40 MG capsule Take 40 mg by mouth daily.  . Testosterone (TESTOPEL) 75 MG PLLT 1 each by Implant route as directed. Patient states that he receives this medication every 5 month for low testosterone 16 pellets last done at 1/30   No current facility-administered medications on file prior to visit.     Chief Complaint  Patient presents with  . Sleep Consult    Pt  referred by Dr. Brigitte Pulse MD; Pt states breathing has worsen over the years. Pt SOB more with exertion. Pt has had history of asthma. Pt had Heart attack 2013; has had issues with SOB since than. Pt is non-smoker, but both parents were smokers.    Pulmonary tests CT chest 01/07/17 >> centrilobular emphysema, 4 mm RML nodule, 4 mm RUL nodule, 3 mm LUL nodule, 4 mm LLL nodule PFT 05/25/17 >> FEV1 1.96 (53%), FEV1% 52, TLC 8.05 (115%), DLCO 46%, + BD  Past medical history ADD, CAD s/p stent, CLL, GERD, Low Testosterone   Vital signs BP 118/78 (BP Location: Left Arm, Cuff Size: Normal)   Pulse 88   Ht 5\' 10"  (1.778 m)   Wt 171 lb (77.6 kg)   SpO2 95%   BMI 24.54 kg/m   History of present illness HAYWOOD MEINDERS is a 61 y.o. male never smoker with dyspnea.  He was very active until about 5 years ago.  Since then he has noticed more trouble doing even light activities.  He used to be able to walk up 5 or 6 flights of stars.  About 4 years ago he went sledding with his daughter, and suddenly couldn't get any breath.  He has hx of CAD and had stents.  He didn't notice any improvement in his dyspnea after this.  He was on metoprolol and feels this made his breathing worse.  He was diagnosed with CLL and is followed by Dr. Marin Olp.  He had CT chest from May 2018 and showed centrilobular emphysema and several pulmonary nodules.  He had PFT September 2018 which showed moderate obstruction, air trapping, severe diffusion defect, and positive bronchodilator response.  His father smoked, but the patient never smoked.  His mother died of emphysema at age 66.  He is not aware of any other family members with emphysema or liver disease.  He has hx of asthma and allergies.  No history of pneumonia or tuberculosis.    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  He then worked with [employer].  After that he work for [employer].  He then worked for [employer].  He doesn't have cough, wheeze, or sputum.  He  denies recent chest pain, fever, or leg swelling.  No skin rash, or hemoptysis.  He was tried on breo, but this didn't seem to help.  He was recently changed to anoro, and was amazed at home much it felt like his lungs had opened up.  He did not have oxygen desaturation with ambulation on room air today.   Physical exam  General - No distress ENT - No sinus tenderness, no oral exudate, no LAN, no thyromegaly, TM clear, pupils equal/reactive Cardiac - s1s2 regular, no murmur, pulses symmetric Chest - No wheeze/rales/dullness, good air entry, normal respiratory excursion Back - No focal tenderness Abd - Soft, non-tender, no organomegaly, + bowel sounds Ext - No edema Neuro - Normal strength, cranial nerves intact Skin - No rashes Psych - Normal mood, and behavior   CMP Latest Ref Rng & Units 12/31/2016 07/11/2013 11/27/2012  Glucose 65 - 99 mg/dL 83 76 89  BUN 6 - 24 mg/dL 17 17 17   Creatinine 0.76 - 1.27 mg/dL 0.87 0.9 0.99  Sodium 134 - 144 mmol/L 140 136 137  Potassium 3.5 - 5.2 mmol/L 4.5 3.9 4.0  Chloride 96 - 106 mmol/L 98 100 103  CO2 18 - 29 mmol/L 24 29 26   Calcium 8.7 - 10.2 mg/dL 9.2 9.0 8.8  Total Protein 6.0 - 8.5 g/dL 6.9 6.7 6.1  Total Bilirubin 0.0 - 1.2 mg/dL 0.3 0.8 0.5  Alkaline Phos 39 - 117 IU/L 47 59 53  AST 0 - 40 IU/L 25 23 17   ALT 0 - 44 IU/L 38 39 24     CBC Latest Ref Rng & Units 01/12/2017 12/31/2016 07/11/2013  WBC 4.0 - 10.5 K/uL 27.2(H) 21.0(H) 15.7(H)  Hemoglobin 13.0 - 17.0 g/dL 17.0 17.3(H) 15.8  Hematocrit 39.0 - 52.0 % 48.8 50.0(H) 46.1  Platelets 150 - 400 K/uL 233 191 238.0    Discussion 61 yo male with progressive dyspnea.  He has history of asthma, and his mother had early onset emphysema.  He never smoked, but did have second hand tobacco exposure until age 31.  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  He has changes of centrilobular emphysema on CT chest and incidental findings of several lung  nodules.   Assessment/plan  COPD with emphysema and asthma. - will check A1AT level and HIV - continue anoro - arrange for overnight oximetry on room air - he will f/u with is PCP for influenza and pneumococcal vaccinations - discussed pulmonary rehab >> his schedule will not allow this at present - discussed long term prognosis of emphysema - might need Echo to assess RV function and pulmonary pressures  Lung nodules. - he will need f/u CT chest w/o contrast in May 2019   Patient Instructions  Lab  tests today  Will arrange for overnight oxygen test  Follow up in 6 weeks with Dr. Halford Chessman or Nurse Practitioner     Chesley Mires, MD [facility] Pager:  [phone] 06/16/2017, 3:46 PM

## 2017-06-17 NOTE — Telephone Encounter (Signed)
VS please advise- pt is questioning why a HIV antibody was drawn on him yesterday.  Thanks!

## 2017-06-19 LAB — HIV ANTIBODY (ROUTINE TESTING W REFLEX): HIV SCREEN 4TH GENERATION: NONREACTIVE

## 2017-06-19 LAB — ALPHA-1 ANTITRYPSIN PHENOTYPE: A-1 Antitrypsin: 114 mg/dL (ref 90–200)

## 2017-06-21 ENCOUNTER — Telehealth: Payer: Self-pay | Admitting: Pulmonary Disease

## 2017-06-21 NOTE — Telephone Encounter (Signed)
HIV 06/16/17 >> negative A1AT 06/16/17 >> 114, MM   Please let him know his lab tests were normal.  He does not have inherited form of emphysema, and no evidence for viral infection as cause of his emphysema.

## 2017-06-23 NOTE — Telephone Encounter (Signed)
Called and spoke with patient today regarding results per results and recommendations for lab tests last week.  Informed the patient of his results today and recommendations per vs. The patient verbalized understanding and denied any questions or concerns at this time. Nothing further needed.

## 2017-06-30 ENCOUNTER — Inpatient Hospital Stay (HOSPITAL_BASED_OUTPATIENT_CLINIC_OR_DEPARTMENT_OTHER): Payer: Managed Care, Other (non HMO)

## 2017-06-30 ENCOUNTER — Ambulatory Visit (HOSPITAL_BASED_OUTPATIENT_CLINIC_OR_DEPARTMENT_OTHER): Payer: Managed Care, Other (non HMO) | Admitting: Family

## 2017-06-30 VITALS — BP 140/84 | HR 75 | Temp 97.6°F | Resp 17 | Wt 172.1 lb

## 2017-06-30 DIAGNOSIS — J439 Emphysema, unspecified: Secondary | ICD-10-CM | POA: Diagnosis not present

## 2017-06-30 DIAGNOSIS — C9110 Chronic lymphocytic leukemia of B-cell type not having achieved remission: Secondary | ICD-10-CM

## 2017-06-30 NOTE — Progress Notes (Signed)
Hematology and Oncology Follow Up Visit  [name] [id number] 05/12/1956 61 y.o. 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 is here today for follow-up. Unfortunately, he has been under a good bit of stress. He has separated from his wife and has his 82 yo daughter living with him. She has also been sick and recently had her gallbladder removed. He was also diagnosed with emphysema but was never a smoker.  He is still having intermittent fatigue but states that this is tolerable.  His WBC count is starting to go up and is 45.0 today. I have forwarded these results to Dr. Lanette Hampshire with [facility] who saw him July. He states that she felt that once he went over 30.0 they would look at treatment options. There was no palpable lymphadenopathy on exam today.  He has some mild SOB with over exertion and will rest until this resolves.  He denies fever, chills, n/v, cough, rash, dizziness, chest pain, palpitations, abdominal pain or changes in bowel or bladder habits.  He has had no night sweats. No weight loss. No swelling in his extremities.  He has arthritis in his hands and has some tingling and tenderness in them at times.  His appetite comes and goes which is "normal" for him. He is staying hydrated. His weight is stable.   ECOG Performance Status: 1 - Symptomatic but completely ambulatory  Medications:  Allergies as of 06/30/2017      Reactions   Metoprolol Shortness Of Breath   Caused BP to increase       Medication List       Accurate as of 06/30/17  8:20 AM. Always use your most recent med list.          ANORO ELLIPTA 62.5-25 MCG/INH Aepb Generic drug:  umeclidinium-vilanterol Inhale 1 puff into the lungs daily.   aspirin 81 MG chewable tablet Chew 1 tablet (81 mg total) by mouth daily.   CONCERTA 27 MG CR tablet Generic drug:  methylphenidate   CRESTOR 20 MG tablet Generic drug:   rosuvastatin Take 10 mg by mouth daily.   CULTURELLE PO Take 1 tablet by mouth as needed.   multivitamin tablet Take 1 tablet by mouth daily.   nitroGLYCERIN 0.4 MG SL tablet Commonly known as:  NITROSTAT Place 1 tablet (0.4 mg total) under the tongue every 5 (five) minutes as needed for chest pain.   OMEGA-3 KRILL OIL PO Take 750 mg by mouth 2 (two) times daily.   omeprazole 40 MG capsule Commonly known as:  PRILOSEC Take 40 mg by mouth daily.   TESTOPEL 75 MG Pllt Generic drug:  Testosterone 1 each by Implant route as directed. Patient states that he receives this medication every 5 month for low testosterone 16 pellets last done at 1/30   VITAMIN C PO Take 1-2 tablets by mouth daily.       Allergies:  Allergies  Allergen Reactions  . Metoprolol Shortness Of Breath    Caused BP to increase     Past Medical History, Surgical history, Social history, and Family History were reviewed and updated.  Review of Systems: All other 10 point review of systems is negative.   Physical Exam:  weight is 172 lb 1.3 oz (78.1 kg). His oral temperature is 97.6 F (36.4 C). His blood pressure is 140/84 and his pulse is 75. His respiration is 17 and oxygen saturation  is 98%.   Wt Readings from Last 3 Encounters:  06/30/17 172 lb 1.3 oz (78.1 kg)  06/16/17 171 lb (77.6 kg)  01/28/17 174 lb 1.3 oz (79 kg)    Ocular: Sclerae unicteric, pupils equal, round and reactive to light Ear-nose-throat: Oropharynx clear, dentition fair Lymphatic: No cervical, supraclavicular or axillary adenopathy Lungs no rales or rhonchi, good excursion bilaterally Heart regular rate and rhythm, no murmur appreciated Abd soft, nontender, positive bowel sounds, no liver or spleen tip palpated on exam, no fluid wave MSK no focal spinal tenderness, no joint edema Neuro: non-focal, well-oriented, appropriate affect Breasts: Deferred  Lab Results  Component Value Date   WBC 27.2 (H) 01/12/2017   HGB  17.0 01/12/2017   HCT 48.8 01/12/2017   MCV 96.6 01/12/2017   PLT 233 01/12/2017   Lab Results  Component Value Date   FERRITIN 117 12/31/2016   IRON 124 12/31/2016   TIBC 250 12/31/2016   UIBC 126 12/31/2016   IRONPCTSAT 50 12/31/2016   Lab Results  Component Value Date   RBC 5.05 01/12/2017   No results found for: KPAFRELGTCHN, LAMBDASER, KAPLAMBRATIO No results found for: IGGSERUM, IGA, IGMSERUM No results found for: Ronnald Ramp, A1GS, Nelida Meuse, SPEI   Chemistry      Component Value Date/Time   NA 140 12/31/2016 1317   K 4.5 12/31/2016 1317   CL 98 12/31/2016 1317   CO2 24 12/31/2016 1317   BUN 17 12/31/2016 1317   CREATININE 0.87 12/31/2016 1317      Component Value Date/Time   CALCIUM 9.2 12/31/2016 1317   ALKPHOS 47 12/31/2016 1317   AST 25 12/31/2016 1317   ALT 38 12/31/2016 1317   BILITOT 0.3 12/31/2016 1317      Impression and Plan: Mr. Tierce is a very pleasant 61 yo caucasian gentleman with CLL, Rai stage I. He is under a great deal of stress at home and his WBC count is now 45.0. He is symptomatic with mild fatigue at this time.   No anemia and platelet count is stable at 227.  I have forwarded the available labs on to Dr. Dorothea Ogle for her to review. I will speak with Dr. Marin Olp about this once he returns on Monday.   He will contact our office with any questions or concerns. We can certainly see her sooner if need be.   Eliezer Bottom, NP 10/24/20188:20 AM

## 2017-07-01 LAB — CBC WITH DIFFERENTIAL/PLATELET
Basophils Absolute: 0.1 10*3/uL (ref 0.0–0.2)
Basos: 0 %
EOS (ABSOLUTE): 0.8 10*3/uL — AB (ref 0.0–0.4)
Eos: 2 %
HEMATOCRIT: 47.3 % (ref 37.5–51.0)
HEMOGLOBIN: 15.9 g/dL (ref 13.0–17.7)
IMMATURE GRANULOCYTES: 0 %
Immature Grans (Abs): 0 10*3/uL (ref 0.0–0.1)
LYMPHS ABS: 34.8 10*3/uL — AB (ref 0.7–3.1)
Lymphs: 77 %
MCH: 32.6 pg (ref 26.6–33.0)
MCHC: 33.6 g/dL (ref 31.5–35.7)
MCV: 97 fL (ref 79–97)
MONOS ABS: 1.2 10*3/uL — AB (ref 0.1–0.9)
Monocytes: 3 %
NEUTROS PCT: 18 %
Neutrophils Absolute: 7.9 10*3/uL — ABNORMAL HIGH (ref 1.4–7.0)
Platelets: 227 10*3/uL (ref 150–379)
RBC: 4.87 x10E6/uL (ref 4.14–5.80)
RDW: 13.7 % (ref 12.3–15.4)
WBC: 45 10*3/uL — ABNORMAL HIGH (ref 3.4–10.8)

## 2017-07-01 LAB — IGG, IGA, IGM
IGA/IMMUNOGLOBULIN A, SERUM: 172 mg/dL (ref 90–386)
IGG (IMMUNOGLOBIN G), SERUM: 849 mg/dL (ref 700–1600)
IgM, Qn, Serum: 59 mg/dL (ref 20–172)

## 2017-07-01 LAB — BETA 2 MICROGLOBULIN, SERUM: BETA 2: 2.9 mg/L — AB (ref 0.6–2.4)

## 2017-07-02 LAB — PROTEIN ELECTROPHORESIS, SERUM, WITH REFLEX
A/G Ratio: 1.6 (ref 0.7–1.7)
ALPHA 1: 0.2 g/dL (ref 0.0–0.4)
ALPHA 2: 0.5 g/dL (ref 0.4–1.0)
Albumin: 3.8 g/dL (ref 2.9–4.4)
Beta: 0.8 g/dL (ref 0.7–1.3)
GAMMA GLOBULIN: 0.9 g/dL (ref 0.4–1.8)
GLOBULIN, TOTAL: 2.4 g/dL (ref 2.2–3.9)
Total Protein: 6.2 g/dL (ref 6.0–8.5)

## 2017-07-02 LAB — COMPREHENSIVE METABOLIC PANEL (CC13)
A/G RATIO: 1.9 (ref 1.2–2.2)
ALBUMIN: 4.3 g/dL (ref 3.5–5.5)
ALT: 27 IU/L (ref 0–44)
AST: 24 IU/L (ref 0–40)
Alkaline Phosphatase, S: 48 IU/L (ref 39–117)
BILIRUBIN TOTAL: 0.3 mg/dL (ref 0.0–1.2)
BUN / CREAT RATIO: 19 (ref 9–20)
BUN: 17 mg/dL (ref 6–24)
CHLORIDE: 102 mmol/L (ref 96–106)
Calcium, Ser: 9.3 mg/dL (ref 8.7–10.2)
Carbon Dioxide, Total: 20 mmol/L (ref 20–29)
Creatinine, Ser: 0.91 mg/dL (ref 0.76–1.27)
GFR calc Af Amer: 106 mL/min/{1.73_m2} (ref >59)
GFR calc non Af Amer: 92 mL/min/{1.73_m2} (ref >59)
GLOBULIN, TOTAL: 2.3 g/dL (ref 1.5–4.5)
Glucose: 62 mg/dL — ABNORMAL LOW (ref 65–99)
POTASSIUM: 5.5 mmol/L — AB (ref 3.5–5.2)
SODIUM: 139 mmol/L (ref 134–144)
Total Protein: 6.6 g/dL (ref 6.0–8.5)

## 2017-07-06 ENCOUNTER — Other Ambulatory Visit: Payer: Self-pay | Admitting: Family

## 2017-07-06 ENCOUNTER — Telehealth: Payer: Self-pay | Admitting: Family

## 2017-07-06 NOTE — Telephone Encounter (Signed)
Left message on personal answering machine with call back number for any questions and to let him know we will follow-up in 3-4 months.

## 2017-07-08 ENCOUNTER — Encounter: Payer: Self-pay | Admitting: Family

## 2017-08-02 ENCOUNTER — Ambulatory Visit: Payer: Managed Care, Other (non HMO) | Admitting: Pulmonary Disease

## 2017-11-09 ENCOUNTER — Encounter: Payer: Self-pay | Admitting: Family

## 2017-12-29 ENCOUNTER — Inpatient Hospital Stay: Payer: Managed Care, Other (non HMO)

## 2017-12-29 ENCOUNTER — Inpatient Hospital Stay: Payer: Managed Care, Other (non HMO) | Attending: Family | Admitting: Hematology & Oncology

## 2018-03-20 ENCOUNTER — Emergency Department (HOSPITAL_BASED_OUTPATIENT_CLINIC_OR_DEPARTMENT_OTHER)
Admission: EM | Admit: 2018-03-20 | Discharge: 2018-03-20 | Disposition: A | Discharge status: Home / Self Care | Payer: Managed Care, Other (non HMO) | Attending: Emergency Medicine | Admitting: Emergency Medicine

## 2018-03-20 ENCOUNTER — Encounter (HOSPITAL_BASED_OUTPATIENT_CLINIC_OR_DEPARTMENT_OTHER): Payer: Self-pay | Admitting: Emergency Medicine

## 2018-03-20 ENCOUNTER — Other Ambulatory Visit: Payer: Self-pay

## 2018-03-20 DIAGNOSIS — Z79899 Other long term (current) drug therapy: Secondary | ICD-10-CM | POA: Diagnosis not present

## 2018-03-20 DIAGNOSIS — Z7982 Long term (current) use of aspirin: Secondary | ICD-10-CM | POA: Insufficient documentation

## 2018-03-20 DIAGNOSIS — K2900 Acute gastritis without bleeding: Secondary | ICD-10-CM | POA: Diagnosis not present

## 2018-03-20 DIAGNOSIS — R112 Nausea with vomiting, unspecified: Secondary | ICD-10-CM | POA: Diagnosis present

## 2018-03-20 DIAGNOSIS — C9110 Chronic lymphocytic leukemia of B-cell type not having achieved remission: Secondary | ICD-10-CM | POA: Insufficient documentation

## 2018-03-20 HISTORY — DX: Chronic obstructive pulmonary disease, unspecified: J44.9

## 2018-03-20 LAB — DIFFERENTIAL
BASOS ABS: 0 10*3/uL (ref 0.0–0.1)
Band Neutrophils: 0 %
Basophils Relative: 0 %
Blasts: 0 %
EOS ABS: 4 10*3/uL — AB (ref 0.0–0.7)
Eosinophils Relative: 3 %
LYMPHS ABS: 115.7 10*3/uL — AB (ref 0.7–4.0)
Lymphocytes Relative: 87 %
MONOS PCT: 4 %
MYELOCYTES: 0 %
Metamyelocytes Relative: 0 %
Monocytes Absolute: 5.3 10*3/uL — ABNORMAL HIGH (ref 0.1–1.0)
NEUTROS ABS: 8 10*3/uL — AB (ref 1.7–7.7)
Neutrophils Relative %: 6 %
Promyelocytes Relative: 0 %
nRBC: 0 /100 WBC

## 2018-03-20 LAB — COMPREHENSIVE METABOLIC PANEL
ALK PHOS: 64 U/L (ref 38–126)
ALT: 18 U/L (ref 0–44)
AST: 23 U/L (ref 15–41)
Albumin: 4 g/dL (ref 3.5–5.0)
Anion gap: 7 (ref 5–15)
BILIRUBIN TOTAL: 0.7 mg/dL (ref 0.3–1.2)
BUN: 21 mg/dL — ABNORMAL HIGH (ref 6–20)
CALCIUM: 8.7 mg/dL — AB (ref 8.9–10.3)
CO2: 26 mmol/L (ref 22–32)
Chloride: 105 mmol/L (ref 98–111)
Creatinine, Ser: 1.03 mg/dL (ref 0.61–1.24)
GLUCOSE: 98 mg/dL (ref 70–99)
POTASSIUM: 4.1 mmol/L (ref 3.5–5.1)
Sodium: 138 mmol/L (ref 135–145)
TOTAL PROTEIN: 6.5 g/dL (ref 6.5–8.1)

## 2018-03-20 LAB — URINALYSIS, ROUTINE W REFLEX MICROSCOPIC
BILIRUBIN URINE: NEGATIVE
GLUCOSE, UA: NEGATIVE mg/dL
HGB URINE DIPSTICK: NEGATIVE
Ketones, ur: NEGATIVE mg/dL
LEUKOCYTES UA: NEGATIVE
NITRITE: NEGATIVE
PROTEIN: NEGATIVE mg/dL
Specific Gravity, Urine: 1.025 (ref 1.005–1.030)
pH: 5.5 (ref 5.0–8.0)

## 2018-03-20 LAB — CBC
HCT: 40.3 % (ref 39.0–52.0)
Hemoglobin: 13.6 g/dL (ref 13.0–17.0)
MCH: 33.5 pg (ref 26.0–34.0)
MCHC: 33.7 g/dL (ref 30.0–36.0)
MCV: 99.3 fL (ref 78.0–100.0)
Platelets: 256 10*3/uL (ref 150–400)
RBC: 4.06 MIL/uL — ABNORMAL LOW (ref 4.22–5.81)
RDW: 14.4 % (ref 11.5–15.5)
WBC: 133 10*3/uL — AB (ref 4.0–10.5)

## 2018-03-20 LAB — LIPASE, BLOOD: Lipase: 21 U/L (ref 11–51)

## 2018-03-20 MED ORDER — SODIUM CHLORIDE 0.9 % IV SOLN
INTRAVENOUS | Status: DC
  Administered 2018-03-20: 21:00:00 via INTRAVENOUS

## 2018-03-20 MED ORDER — ONDANSETRON HCL 4 MG/2ML IJ SOLN
4.0000 mg | Freq: Once | INTRAMUSCULAR | Status: AC
  Administered 2018-03-20: 4 mg via INTRAVENOUS
  Filled 2018-03-20: qty 2

## 2018-03-20 MED ORDER — SODIUM CHLORIDE 0.9 % IV BOLUS
1000.0000 mL | Freq: Once | INTRAVENOUS | Status: AC
  Administered 2018-03-20: 1000 mL via INTRAVENOUS

## 2018-03-20 NOTE — ED Triage Notes (Signed)
Patient states that he threw up about 6 - 7 times last night. The patient reports that he now has Nausea and weakness

## 2018-03-20 NOTE — ED Notes (Signed)
Discharge instructions reviewed with pt. Pt verbalized understanding.   

## 2018-03-20 NOTE — Discharge Instructions (Addendum)
Follow-up with your oncology doctors at [facility], lab report provided to you to go over the white blood cell count numbers.  Return for any new or worse symptoms.

## 2018-03-20 NOTE — ED Notes (Signed)
Date and time results received: 03/20/18 1847   Test: WBC Critical Value: 133 Name of Provider Notified: Zackowski Orders Received? Or Actions Taken?: no orders given

## 2018-03-20 NOTE — ED Notes (Signed)
Alert, NAD, calm, interactive, resps e/u, speaking in clear complete sentences, no dyspnea noted, skin W&D, VSS, (denies: HA, pain, sob, nausea, dizziness or visual changes), EDP into room.

## 2018-03-20 NOTE — ED Notes (Addendum)
DO NOT DISCUSS MEDICAL HISTORY/CONDITION WITH ANY VISITOR IN ROOM UNLESS PT AGREES

## 2018-03-20 NOTE — ED Provider Notes (Signed)
[facility] EMERGENCY DEPARTMENT Provider Note   CSN: [id number] Arrival date & time: 03/20/18  1747     History   Chief Complaint Chief Complaint  Patient presents with  . Emesis    HPI Melvin Martin is a 62 y.o. male.  Patient with known chronically to see leukemia followed closely by [facility].  Discussions have been moving towards probably needing to start chemotherapy.  Patient with acute onset of several episodes of nausea and vomiting throughout the night.  Patient had some nausea today and some generalized weakness currently even the nausea seems to be improved significantly.  No diarrhea no fevers.  Patient with a known history of COPD is oxygen saturating usually in the low 90s he is not on home oxygen.  No significant chest pain.  No vomiting of blood.  No significant abdominal pain.     Past Medical History:  Diagnosis Date  . ADD (attention deficit disorder)   . Asthmatic bronchitis   . Cancer ([place])   . Chest pain    pt relates to GERDS and asthmatic bronchitis  . COPD (chronic obstructive pulmonary disease) ([place])   . Coronary artery disease   . GERD (gastroesophageal reflux disease)   . Hyperlipidemia LDL goal < 70   . Low testosterone   . NSTEMI (non-ST elevated myocardial infarction) [facility]) December 2013   with PCI with DES to the ramus and LAD    Patient Active Problem List   Diagnosis Date Noted  . CLL (chronic lymphocytic leukemia) ([place]) 01/06/2017  . Coronary artery disease   . Non-ST elevation myocardial infarction (NSTEMI), initial care episode ([place]) 08/18/2012  . GERD (gastroesophageal reflux disease)   . Dyslipidemia (high LDL; low HDL)     Past Surgical History:  Procedure Laterality Date  . CORONARY ANGIOPLASTY WITH STENT PLACEMENT  08/18/2012   DES to the ramus and LAD  . Cyst removal, mouth    . LEFT HEART CATHETERIZATION WITH CORONARY ANGIOGRAM N/A 08/18/2012   Procedure: LEFT HEART CATHETERIZATION WITH CORONARY ANGIOGRAM;   Surgeon: Sherren Mocha, MD;  Location: [facility] CATH LAB;  Service: Cardiovascular;  Laterality: N/A;  . PERCUTANEOUS CORONARY STENT INTERVENTION (PCI-S)  08/18/2012   Procedure: PERCUTANEOUS CORONARY STENT INTERVENTION (PCI-S);  Surgeon: Sherren Mocha, MD;  Location: [facility] CATH LAB;  Service: Cardiovascular;;        Home Medications    Prior to Admission medications   Medication Sig Start Date End Date Taking? Authorizing Provider  Ascorbic Acid (VITAMIN C PO) Take 1-2 tablets by mouth daily.     [provider]  aspirin 81 MG chewable tablet Chew 1 tablet (81 mg total) by mouth daily. 08/19/12   Barrett, Evelene Croon, PA-C  CONCERTA 27 MG CR tablet  06/21/14   [provider]  CRESTOR 20 MG tablet Take 10 mg by mouth daily.  07/17/14   [provider]  Lactobacillus Rhamnosus, GG, (CULTURELLE PO) Take 1 tablet by mouth as needed.    [provider]  Multiple Vitamin (MULTIVITAMIN) tablet Take 1 tablet by mouth daily.    [provider]  nitroGLYCERIN (NITROSTAT) 0.4 MG SL tablet Place 1 tablet (0.4 mg total) under the tongue every 5 (five) minutes as needed for chest pain. 09/17/15   [name], [name], MD  OMEGA-3 KRILL OIL PO Take 750 mg by mouth 2 (two) times daily.     [provider]  omeprazole (PRILOSEC) 40 MG capsule Take 40 mg by mouth daily.  [provider]  Testosterone (TESTOPEL) 75 MG PLLT 1 each by Implant route as directed. Patient states that he receives this medication every 5 month for low testosterone 16 pellets last done at 1/30    [provider]  umeclidinium-vilanterol (ANORO ELLIPTA) 62.5-25 MCG/INH AEPB Inhale 1 puff into the lungs daily.    [provider]    Family History Family History  Problem Relation Age of Onset  . Emphysema Mother   . Heart failure Father   . Heart attack Father   . Heart attack Cousin     Social History Social History   Tobacco Use  . Smoking status: Never  Smoker  . Smokeless tobacco: Never Used  Substance Use Topics  . Alcohol use: No  . Drug use: No     Allergies   Metoprolol   Review of Systems Review of Systems  Constitutional: Negative for fever.  HENT: Negative for congestion.   Eyes: Negative for redness.  Respiratory: Negative for wheezing.   Cardiovascular: Negative for chest pain.  Gastrointestinal: Positive for nausea and vomiting. Negative for abdominal pain and diarrhea.  Genitourinary: Negative for dysuria.  Musculoskeletal: Negative for myalgias.  Skin: Negative for rash.  Neurological: Negative for syncope and headaches.  Hematological: Does not bruise/bleed easily.  Psychiatric/Behavioral: Negative for confusion.     Physical Exam Updated Vital Signs BP 100/83   Pulse 77   Temp 98.5 F (36.9 C) (Oral)   Resp 18   Ht 1.778 m (5\' 10" )   Wt 79.8 kg (176 lb)   SpO2 92%   BMI 25.25 kg/m   Physical Exam  Constitutional: He is oriented to person, place, and time. He appears well-developed and well-nourished. No distress.  HENT:  Head: Normocephalic and atraumatic.  Mouth/Throat: Oropharynx is clear and moist.  Eyes: Pupils are equal, round, and reactive to light. Conjunctivae and EOM are normal.  Neck: Neck supple.  Cardiovascular: Normal rate, regular rhythm and normal heart sounds.  Pulmonary/Chest: Effort normal and breath sounds normal. No respiratory distress. He has no wheezes.  Abdominal: Soft. Bowel sounds are normal. He exhibits no distension. There is no tenderness.  Musculoskeletal: Normal range of motion. He exhibits no edema.  Neurological: He is alert and oriented to person, place, and time. No cranial nerve deficit or sensory deficit. He exhibits normal muscle tone. Coordination normal.  Skin: Skin is warm. No rash noted.  Nursing note and vitals reviewed.    ED Treatments / Results  Labs (all labs ordered are listed, but only abnormal results are displayed) Labs Reviewed    COMPREHENSIVE METABOLIC PANEL - Abnormal; Notable for the following components:      Result Value   BUN 21 (*)    Calcium 8.7 (*)    All other components within normal limits  CBC - Abnormal; Notable for the following components:   WBC 133.0 (*)    RBC 4.06 (*)    All other components within normal limits  DIFFERENTIAL - Abnormal; Notable for the following components:   Neutro Abs 8.0 (*)    Lymphs Abs 115.7 (*)    Monocytes Absolute 5.3 (*)    Eosinophils Absolute 4.0 (*)    All other components within normal limits  LIPASE, BLOOD  URINALYSIS, ROUTINE W REFLEX MICROSCOPIC  PATHOLOGIST SMEAR REVIEW    EKG None  Radiology No results found.  Procedures Procedures (including critical care time)  Medications Ordered in ED Medications  0.9 %  sodium chloride infusion ( Intravenous  New Bag/Given 03/20/18 2119)  sodium chloride 0.9 % bolus 1,000 mL (0 mLs Intravenous Stopped 03/20/18 2119)  ondansetron (ZOFRAN) injection 4 mg (4 mg Intravenous Given 03/20/18 1949)     Initial Impression / Assessment and Plan / ED Course  I have reviewed the triage vital signs and the nursing notes.  Pertinent labs & imaging results that were available during my care of the patient were reviewed by me and considered in my medical decision making (see chart for details).     Patient with known history of chronic leukocytic leukemia.  Says recently his white blood cell count has been in the 100,000 range.  Today came into the 133,000.  Almost all lymphocytes.  Platelet counts are normal.  Patient appears nontoxic no acute distress.  Patient given IV fluid hydration here feels better.  Patient given his lab results he will contact his oncologist at [facility] to give him an update on the current white blood cell count.  Patient will return for any new or worse symptoms.  Patient no longer feels nauseated did not want antinausea medicine.  Patient stable for discharge home.  Patient's oxygen saturations here  low 90s.  No acute respiratory distress.  Patient has long-standing history of COPD not on home oxygen.  Abdomen soft nontender no concerns for acute abdominal process.  Final Clinical Impressions(s) / ED Diagnoses   Final diagnoses:  Acute gastritis without hemorrhage, unspecified gastritis type  CLL (chronic lymphocytic leukemia) [facility])    ED Discharge Orders    None       Fredia Sorrow, MD 03/20/18 2213

## 2018-03-22 LAB — PATHOLOGIST SMEAR REVIEW

## 2018-03-25 ENCOUNTER — Encounter (HOSPITAL_COMMUNITY): Payer: Self-pay | Admitting: Emergency Medicine

## 2018-03-25 ENCOUNTER — Other Ambulatory Visit: Payer: Self-pay

## 2018-03-25 ENCOUNTER — Emergency Department (HOSPITAL_COMMUNITY): Payer: Managed Care, Other (non HMO)

## 2018-03-25 ENCOUNTER — Observation Stay (HOSPITAL_COMMUNITY)
Admission: EM | Admit: 2018-03-25 | Discharge: 2018-03-26 | Disposition: A | Discharge status: Home / Self Care | Payer: Managed Care, Other (non HMO) | Attending: Internal Medicine | Admitting: Internal Medicine

## 2018-03-25 DIAGNOSIS — I252 Old myocardial infarction: Secondary | ICD-10-CM | POA: Diagnosis not present

## 2018-03-25 DIAGNOSIS — C9190 Lymphoid leukemia, unspecified not having achieved remission: Secondary | ICD-10-CM

## 2018-03-25 DIAGNOSIS — C9110 Chronic lymphocytic leukemia of B-cell type not having achieved remission: Secondary | ICD-10-CM | POA: Insufficient documentation

## 2018-03-25 DIAGNOSIS — K219 Gastro-esophageal reflux disease without esophagitis: Secondary | ICD-10-CM

## 2018-03-25 DIAGNOSIS — J449 Chronic obstructive pulmonary disease, unspecified: Secondary | ICD-10-CM | POA: Diagnosis not present

## 2018-03-25 DIAGNOSIS — Z955 Presence of coronary angioplasty implant and graft: Secondary | ICD-10-CM | POA: Diagnosis not present

## 2018-03-25 DIAGNOSIS — Z7951 Long term (current) use of inhaled steroids: Secondary | ICD-10-CM | POA: Insufficient documentation

## 2018-03-25 DIAGNOSIS — Z7982 Long term (current) use of aspirin: Secondary | ICD-10-CM | POA: Diagnosis not present

## 2018-03-25 DIAGNOSIS — Z888 Allergy status to other drugs, medicaments and biological substances status: Secondary | ICD-10-CM | POA: Diagnosis not present

## 2018-03-25 DIAGNOSIS — Z856 Personal history of leukemia: Secondary | ICD-10-CM

## 2018-03-25 DIAGNOSIS — Z79899 Other long term (current) drug therapy: Secondary | ICD-10-CM | POA: Diagnosis not present

## 2018-03-25 DIAGNOSIS — R0789 Other chest pain: Principal | ICD-10-CM

## 2018-03-25 DIAGNOSIS — R079 Chest pain, unspecified: Secondary | ICD-10-CM | POA: Diagnosis not present

## 2018-03-25 DIAGNOSIS — I2510 Atherosclerotic heart disease of native coronary artery without angina pectoris: Secondary | ICD-10-CM | POA: Diagnosis present

## 2018-03-25 DIAGNOSIS — E785 Hyperlipidemia, unspecified: Secondary | ICD-10-CM | POA: Diagnosis not present

## 2018-03-25 DIAGNOSIS — E875 Hyperkalemia: Secondary | ICD-10-CM

## 2018-03-25 LAB — CBC WITH DIFFERENTIAL/PLATELET
BAND NEUTROPHILS: 0 %
BASOS ABS: 0 10*3/uL (ref 0.0–0.1)
BASOS PCT: 0 %
Blasts: 0 %
EOS PCT: 0 %
Eosinophils Absolute: 0 10*3/uL (ref 0.0–0.7)
HCT: 42.5 % (ref 39.0–52.0)
Hemoglobin: 14 g/dL (ref 13.0–17.0)
LYMPHS ABS: 88.7 10*3/uL — AB (ref 0.7–4.0)
Lymphocytes Relative: 87 %
MCH: 32.6 pg (ref 26.0–34.0)
MCHC: 32.9 g/dL (ref 30.0–36.0)
MCV: 99.1 fL (ref 78.0–100.0)
METAMYELOCYTES PCT: 0 %
MONO ABS: 2 10*3/uL — AB (ref 0.1–1.0)
MONOS PCT: 2 %
Myelocytes: 0 %
NEUTROS ABS: 11.2 10*3/uL — AB (ref 1.7–7.7)
Neutrophils Relative %: 11 %
Other: 0 %
PLATELETS: 234 10*3/uL (ref 150–400)
Promyelocytes Relative: 0 %
RBC: 4.29 MIL/uL (ref 4.22–5.81)
RDW: 14 % (ref 11.5–15.5)
WBC: 101.9 10*3/uL (ref 4.0–10.5)
nRBC: 0 /100 WBC

## 2018-03-25 LAB — LIPID PANEL
CHOLESTEROL: 66 mg/dL (ref 0–200)
HDL: 12 mg/dL — ABNORMAL LOW (ref >40)
LDL CALC: 32 mg/dL (ref 0–99)
Total CHOL/HDL Ratio: 5.5 RATIO
Triglycerides: 109 mg/dL (ref <150)
VLDL: 22 mg/dL (ref 0–40)

## 2018-03-25 LAB — CBC
HCT: 40.1 % (ref 39.0–52.0)
HEMOGLOBIN: 12.9 g/dL — AB (ref 13.0–17.0)
MCH: 32.3 pg (ref 26.0–34.0)
MCHC: 32.2 g/dL (ref 30.0–36.0)
MCV: 100.5 fL — ABNORMAL HIGH (ref 78.0–100.0)
Platelets: 223 10*3/uL (ref 150–400)
RBC: 3.99 MIL/uL — ABNORMAL LOW (ref 4.22–5.81)
RDW: 14 % (ref 11.5–15.5)
WBC: 90.9 10*3/uL (ref 4.0–10.5)

## 2018-03-25 LAB — BASIC METABOLIC PANEL
ANION GAP: 6 (ref 5–15)
BUN: 13 mg/dL (ref 6–20)
CALCIUM: 9.3 mg/dL (ref 8.9–10.3)
CO2: 28 mmol/L (ref 22–32)
CREATININE: 1.1 mg/dL (ref 0.61–1.24)
Chloride: 107 mmol/L (ref 98–111)
GLUCOSE: 97 mg/dL (ref 70–99)
POTASSIUM: 5.8 mmol/L — AB (ref 3.5–5.1)
SODIUM: 141 mmol/L (ref 135–145)

## 2018-03-25 LAB — URIC ACID: URIC ACID, SERUM: 5.8 mg/dL (ref 3.7–8.6)

## 2018-03-25 LAB — POTASSIUM
Potassium: 6.3 mmol/L (ref 3.5–5.1)
Potassium: 4.6 mmol/L (ref 3.5–5.1)
Potassium: 5 mmol/L (ref 3.5–5.1)

## 2018-03-25 LAB — PHOSPHORUS: Phosphorus: 3.5 mg/dL (ref 2.5–4.6)

## 2018-03-25 LAB — TROPONIN I: Troponin I: <0.03 ng/mL (ref <0.03)

## 2018-03-25 LAB — I-STAT TROPONIN, ED: TROPONIN I, POC: 0 ng/mL (ref 0.00–0.08)

## 2018-03-25 MED ORDER — ASPIRIN EC 325 MG PO TBEC
325.0000 mg | DELAYED_RELEASE_TABLET | Freq: Every day | ORAL | Status: DC
  Administered 2018-03-26: 325 mg via ORAL
  Filled 2018-03-25: qty 1

## 2018-03-25 MED ORDER — ENOXAPARIN SODIUM 40 MG/0.4ML ~~LOC~~ SOLN
40.0000 mg | SUBCUTANEOUS | Status: DC
  Administered 2018-03-25: 40 mg via SUBCUTANEOUS
  Filled 2018-03-25: qty 0.4

## 2018-03-25 MED ORDER — ENOXAPARIN SODIUM 40 MG/0.4ML ~~LOC~~ SOLN: 40.0000 mg | SUBCUTANEOUS | Status: DC

## 2018-03-25 MED ORDER — VITAMIN C 500 MG PO TABS
250.0000 mg | ORAL_TABLET | Freq: Every day | ORAL | Status: DC
  Administered 2018-03-26: 250 mg via ORAL
  Filled 2018-03-25: qty 1

## 2018-03-25 MED ORDER — SODIUM CHLORIDE 0.9 % IV SOLN
1.0000 g | Freq: Once | INTRAVENOUS | Status: AC
  Administered 2018-03-25: 1 g via INTRAVENOUS
  Filled 2018-03-25 (×2): qty 10

## 2018-03-25 MED ORDER — TRIAMCINOLONE ACETONIDE 55 MCG/ACT NA AERO
1.0000 | INHALATION_SPRAY | Freq: Every day | NASAL | Status: DC
  Administered 2018-03-25 – 2018-03-26 (×2): 1 via NASAL
  Filled 2018-03-25: qty 10.8

## 2018-03-25 MED ORDER — MIRABEGRON ER 50 MG PO TB24
50.0000 mg | ORAL_TABLET | Freq: Every day | ORAL | Status: DC
  Administered 2018-03-26: 50 mg via ORAL
  Filled 2018-03-25: qty 1

## 2018-03-25 MED ORDER — NITROGLYCERIN 0.4 MG SL SUBL: 0.4000 mg | SUBLINGUAL_TABLET | SUBLINGUAL | Status: DC | PRN

## 2018-03-25 MED ORDER — MORPHINE SULFATE (PF) 4 MG/ML IV SOLN: 2.0000 mg | INTRAVENOUS | Status: DC | PRN

## 2018-03-25 MED ORDER — INSULIN ASPART 100 UNIT/ML IV SOLN: 10.0000 [IU] | Freq: Once | INTRAVENOUS | Status: DC

## 2018-03-25 MED ORDER — IPRATROPIUM-ALBUTEROL 20-100 MCG/ACT IN AERS: 1.0000 | INHALATION_SPRAY | Freq: Four times a day (QID) | RESPIRATORY_TRACT | Status: DC | PRN

## 2018-03-25 MED ORDER — FUROSEMIDE 10 MG/ML IJ SOLN
40.0000 mg | Freq: Once | INTRAMUSCULAR | Status: AC
  Administered 2018-03-25: 40 mg via INTRAVENOUS
  Filled 2018-03-25: qty 4

## 2018-03-25 MED ORDER — DEXTROSE 50 % IV SOLN: 1.0000 | Freq: Once | INTRAVENOUS | Status: DC

## 2018-03-25 MED ORDER — SODIUM CHLORIDE 0.9 % IV BOLUS
500.0000 mL | Freq: Once | INTRAVENOUS | Status: AC
  Administered 2018-03-25: 500 mL via INTRAVENOUS

## 2018-03-25 MED ORDER — SODIUM POLYSTYRENE SULFONATE 15 GM/60ML PO SUSP
30.0000 g | Freq: Once | ORAL | Status: AC
  Administered 2018-03-25: 30 g via ORAL
  Filled 2018-03-25: qty 120

## 2018-03-25 MED ORDER — SODIUM BICARBONATE 8.4 % IV SOLN: 50.0000 meq | Freq: Once | INTRAVENOUS | Status: DC

## 2018-03-25 MED ORDER — GI COCKTAIL ~~LOC~~: 30.0000 mL | Freq: Four times a day (QID) | ORAL | Status: DC | PRN

## 2018-03-25 MED ORDER — ONDANSETRON HCL 4 MG/2ML IJ SOLN: 4.0000 mg | Freq: Four times a day (QID) | INTRAMUSCULAR | Status: DC | PRN

## 2018-03-25 MED ORDER — IPRATROPIUM-ALBUTEROL 0.5-2.5 (3) MG/3ML IN SOLN: 3.0000 mL | RESPIRATORY_TRACT | Status: DC | PRN

## 2018-03-25 MED ORDER — ALBUTEROL SULFATE (2.5 MG/3ML) 0.083% IN NEBU: 10.0000 mg | INHALATION_SOLUTION | Freq: Once | RESPIRATORY_TRACT | Status: DC

## 2018-03-25 MED ORDER — TAMSULOSIN HCL 0.4 MG PO CAPS
0.4000 mg | ORAL_CAPSULE | Freq: Every day | ORAL | Status: DC
  Administered 2018-03-25: 0.4 mg via ORAL
  Filled 2018-03-25: qty 1

## 2018-03-25 MED ORDER — ROSUVASTATIN CALCIUM 10 MG PO TABS
10.0000 mg | ORAL_TABLET | Freq: Every day | ORAL | Status: DC
  Administered 2018-03-25 – 2018-03-26 (×2): 10 mg via ORAL
  Filled 2018-03-25 (×2): qty 1

## 2018-03-25 MED ORDER — ACETAMINOPHEN 325 MG PO TABS: 650.0000 mg | ORAL_TABLET | ORAL | Status: DC | PRN

## 2018-03-25 MED ORDER — PANTOPRAZOLE SODIUM 40 MG PO TBEC
80.0000 mg | DELAYED_RELEASE_TABLET | Freq: Every day | ORAL | Status: DC
  Administered 2018-03-25 – 2018-03-26 (×2): 80 mg via ORAL
  Filled 2018-03-25 (×2): qty 2

## 2018-03-25 NOTE — ED Notes (Signed)
Attempted report 

## 2018-03-25 NOTE — ED Notes (Signed)
Cardiologist aware of most recent potassium value

## 2018-03-25 NOTE — H&P (Signed)
History and Physical    Melvin Martin LOV:[id number] DOB: 17-Jul-1956 DOA: 03/25/2018  PCP: Melvin Redwood, MD Patient coming from: home  Chief Complaint: chest pain  HPI: Melvin Martin is a 62 y.o. male with medical history significant for CL L, CAD status post stents 2 2013, prior NSTEMI COPD not on home oxygen, GERD, presents to the emergency Department chief complaint chest pain. [facility] hospitalists are asked to admit  Information is obtained from the patient and the chart. He states yesterday evening he was involved in a "heated discussion" with his ex-wife. Shortly thereafter he developed chest "pressure. He states the location of this was left anterior and radiated to the Martin on occasion. He waited this pressure of 1 out of 10. He states he did fluctuate somewhat go to a 2. He reports this pain was different from when he had his MI. He denies any diaphoresis nausea vomiting worsening shortness of breath. He denies headache dizziness syncope or near-syncope. He denies any palpitations lower extremity edema. He denies dysuria hematuria frequency or urgency. States the pain was the same no matter what his position or activity. He states he has not seen his local cardiologist in 2 years but does have an appointment in September.    ED Course: in the emergency department he is afebrile hemodynamically stable and not hypoxic.  Review of Systems: As per HPI otherwise all other systems reviewed and are negative.   Ambulatory Status:ambulates independently is independent with ADLs works full-time at [employer]  Past Medical History:  Diagnosis Date  . ADD (attention deficit disorder)   . Asthmatic bronchitis   . Cancer ([place])   . Chest pain    pt relates to GERDS and asthmatic bronchitis  . COPD (chronic obstructive pulmonary disease) ([facility])   . Coronary artery disease   . GERD (gastroesophageal reflux disease)   . Hyperlipidemia LDL goal < 70   . Low testosterone   . NSTEMI (non-ST  elevated myocardial infarction) [facility]) December 2013   with PCI with DES to the ramus and LAD    Past Surgical History:  Procedure Laterality Date  . CORONARY ANGIOPLASTY WITH STENT PLACEMENT  08/18/2012   DES to the ramus and LAD  . Cyst removal, mouth    . LEFT HEART CATHETERIZATION WITH CORONARY ANGIOGRAM N/A 08/18/2012   Procedure: LEFT HEART CATHETERIZATION WITH CORONARY ANGIOGRAM;  Surgeon: Sherren Mocha, MD;  Location: [facility] CATH LAB;  Service: Cardiovascular;  Laterality: N/A;  . PERCUTANEOUS CORONARY STENT INTERVENTION (PCI-S)  08/18/2012   Procedure: PERCUTANEOUS CORONARY STENT INTERVENTION (PCI-S);  Surgeon: Sherren Mocha, MD;  Location: [facility] CATH LAB;  Service: Cardiovascular;;    Social History   Socioeconomic History  . Marital status: Legally Separated    Spouse name: Not on file  . Number of children: Not on file  . Years of education: Not on file  . Highest education level: Not on file  Occupational History  . Not on file  Social Needs  . Financial resource strain: Not on file  . Food insecurity:    Worry: Not on file    Inability: Not on file  . Transportation needs:    Medical: Not on file    Non-medical: Not on file  Tobacco Use  . Smoking status: Never Smoker  . Smokeless tobacco: Never Used  Substance and Sexual Activity  . Alcohol use: No  . Drug use: No  . Sexual activity: Yes  Lifestyle  . Physical activity:  Days per week: Not on file    Minutes per session: Not on file  . Stress: Not on file  Relationships  . Social connections:    Talks on phone: Not on file    Gets together: Not on file    Attends religious service: Not on file    Active member of club or organization: Not on file    Attends meetings of clubs or organizations: Not on file    Relationship status: Not on file  . Intimate partner violence:    Fear of current or ex partner: Not on file    Emotionally abused: Not on file    Physically abused: Not on file    Forced sexual  activity: Not on file  Other Topics Concern  . Not on file  Social History Narrative   Lives with wife and 2 small children    Allergies  Allergen Reactions  . Metoprolol Shortness Of Breath    Caused BP to increase     Family History  Problem Relation Age of Onset  . Emphysema Mother   . Heart failure Father   . Heart attack Father   . Heart attack Cousin     Prior to Admission medications   Medication Sig Start Date End Date Taking? Authorizing Provider  Ascorbic Acid (VITAMIN C PO) Take 1-2 tablets by mouth daily.    Yes [provider]  aspirin 81 MG chewable tablet Chew 1 tablet (81 mg total) by mouth daily. 08/19/12  Yes Barrett, Evelene Croon, PA-C  Clobetasol Prop Emollient Base (CLOBETASOL PROPIONATE E EX) Apply 1 application topically as needed (itching).   Yes [provider]  CRESTOR 20 MG tablet Take 10 mg by mouth daily.  07/17/14  Yes [provider]  diclofenac sodium (VOLTAREN) 1 % GEL Apply 2 g topically as needed (joint pain).   Yes [provider]  Ipratropium-Albuterol (COMBIVENT RESPIMAT) 20-100 MCG/ACT AERS respimat Inhale 1 puff into the lungs 4 (four) times daily as needed for wheezing. 10/27/17 10/27/18 Yes [provider]  MYRBETRIQ 50 MG TB24 tablet Take 50 mg by mouth daily. 03/03/18  Yes [provider]  nitroGLYCERIN (NITROSTAT) 0.4 MG SL tablet Place 1 tablet (0.4 mg total) under the tongue every 5 (five) minutes as needed for chest pain. 09/17/15  Yes Martin, Melvin M, MD  Omega-3 Fatty Acids (SALMON OIL-1000 PO) Take 1,000 capsules by mouth 3 (three) times daily.   Yes [provider]  omeprazole (PRILOSEC) 40 MG capsule Take 40 mg by mouth daily.   Yes [provider]  Skin Protectants, Misc. (EUCERIN) cream Apply 1 application topically as needed for dry skin.   Yes [provider]  Sodium Fluoride (PREVIDENT 5000 BOOSTER DT) Place 1 application onto teeth as needed (dental  health).   Yes [provider]  STIOLTO RESPIMAT 2.5-2.5 MCG/ACT AERS Inhale 2 puffs into the lungs daily. 03/19/18  Yes [provider]  tamsulosin (FLOMAX) 0.4 MG CAPS capsule Take 0.4 mg by mouth at bedtime. 01/21/18  Yes [provider]  Testosterone (TESTOPEL) 75 MG PLLT 1 each by Implant route as directed. Patient states that he receives this medication every 5 month for low testosterone 16 pellets last done at 1/30   Yes [provider]  triamcinolone (NASACORT) 55 MCG/ACT AERO nasal inhaler Place 1 spray into the nose daily.   Yes [provider]    Physical Exam: Vitals:   03/25/18 1115 03/25/18 1130 03/25/18 1156 03/25/18  1200  BP:   128/83 131/83  Pulse: 76 74 76 73  Resp: 16 19 15 15   Temp:      TempSrc:      SpO2: 95% 93% 95% 94%     General:  Appears calm and comfortable sitting up in bed in no acute distress Eyes:  PERRL, EOMI, normal lids, iris ENT:  grossly normal hearing, lips & tongue, mucous membranes of his mouth are moist and pink Neck:  no LAD, masses or thyromegaly Cardiovascular:  RRR, no m/r/g. No LE edema. Pedal pulses present and palpable chest nontender to palpation Respiratory:  CTA bilaterally, no w/r/r. Normal respiratory effort. Abdomen:  soft, ntnd, positive bowel sounds throughout no guarding or rebounding Skin:  no rash or induration seen on limited exam Musculoskeletal:  grossly normal tone BUE/BLE, good ROM, no bony abnormality Psychiatric:  grossly normal mood and affect, speech fluent and appropriate, AOx3 Neurologic:  CN 2-12 grossly intact, moves all extremities in coordinated fashion, sensation intact  Labs on Admission: I have personally reviewed following labs and imaging studies  CBC: Recent Labs  Lab 03/20/18 1812 03/25/18 0919  WBC 133.0* 90.9*  NEUTROABS 8.0*  --   HGB 13.6 12.9*  HCT 40.3 40.1  MCV 99.3 100.5*  PLT 256 401   Basic Metabolic Panel: Recent Labs  Lab 03/20/18 1812  03/25/18 0919  NA 138 141  K 4.1 5.8*  CL 105 107  CO2 26 28  GLUCOSE 98 97  BUN 21* 13  CREATININE 1.03 1.10  CALCIUM 8.7* 9.3   GFR: Estimated Creatinine Clearance: 74.7 mL/min (by C-G formula based on SCr of 1.1 mg/dL). Liver Function Tests: Recent Labs  Lab 03/20/18 1812  AST 23  ALT 18  ALKPHOS 64  BILITOT 0.7  PROT 6.5  ALBUMIN 4.0   Recent Labs  Lab 03/20/18 1812  LIPASE 21   No results for input(s): AMMONIA in the last 168 hours. Coagulation Profile: No results for input(s): INR, PROTIME in the last 168 hours. Cardiac Enzymes: No results for input(s): CKTOTAL, CKMB, CKMBINDEX, TROPONINI in the last 168 hours. BNP (last 3 results) No results for input(s): PROBNP in the last 8760 hours. HbA1C: No results for input(s): HGBA1C in the last 72 hours. CBG: No results for input(s): GLUCAP in the last 168 hours. Lipid Profile: No results for input(s): CHOL, HDL, LDLCALC, TRIG, CHOLHDL, LDLDIRECT in the last 72 hours. Thyroid Function Tests: No results for input(s): TSH, T4TOTAL, FREET4, T3FREE, THYROIDAB in the last 72 hours. Anemia Panel: No results for input(s): VITAMINB12, FOLATE, FERRITIN, TIBC, IRON, RETICCTPCT in the last 72 hours. Urine analysis:    Component Value Date/Time   COLORURINE YELLOW 03/20/2018 1943   APPEARANCEUR CLEAR 03/20/2018 1943   LABSPEC 1.025 03/20/2018 1943   PHURINE 5.5 03/20/2018 1943   GLUCOSEU NEGATIVE 03/20/2018 [place] 03/20/2018 [place] 03/20/2018 [place] NEGATIVE 03/20/2018 1943   PROTEINUR NEGATIVE 03/20/2018 1943   NITRITE NEGATIVE 03/20/2018 1943   LEUKOCYTESUR NEGATIVE 03/20/2018 1943    Creatinine Clearance: Estimated Creatinine Clearance: 74.7 mL/min (by C-G formula based on SCr of 1.1 mg/dL).  Sepsis Labs: @LABRCNTIP (procalcitonin:4,lacticidven:4) )No results found for this or any previous visit (from the past 240 hour(s)).   Radiological Exams on Admission: Dg  Chest 2 View  Result Date: 03/25/2018 CLINICAL DATA:  Chest pain. EXAM: CHEST - 2 VIEW COMPARISON:  CT chest 01/07/2017 FINDINGS: The heart size is normal. No pleural effusion identified. A  coronary artery stent is noted within the LAD. The lungs are hyperinflated but clear. Chronic interstitial coarsening of emphysema identified. Scar like densities identified within both lower lobes. IMPRESSION: 1. No acute cardiopulmonary abnormalities. 2. Bibasilar scarring and chronic changes of emphysema. Aortic Atherosclerosis (ICD10-I70.0). Electronically Signed   By: Kerby Moors M.D.   On: 03/25/2018 09:57    EKG: Independently reviewed. Normal sinus rhythm `prominent/peaked t waves  Assessment/Plan Principal Problem:   Chest pain Active Problems:   Coronary artery disease   GERD (gastroesophageal reflux disease)   Dyslipidemia (high LDL; low HDL)   CLL (chronic lymphocytic leukemia) ([facility])   Hyperkalemia   COPD (chronic obstructive pulmonary disease) ([place])   #1. Chest pain. Mostly atypical. Heart score 6. Patient does have a history of CAD status post stents 2 and in STEMI 2013. Pain has been continuous but very low level. He states it's quite different from the pain he had with his in STEMI. Initial troponin negative. EKG normal sinus rhythm with prominent/peak T waves. Chest x-ray without acute cardiopulmonary abnormalities. -Admit to telemetry -Cycle troponin -Serial EKG -lipid panel -Aspirin -continue statin -GI cocktail -Supportive therapy -Cardiology consult  #2. Hyperkalemia. Potassium level 5.8. EKG changes as noted above. Etiology unclear. He is provided with IV fluids IV Lasix and Kayexalate in the emergency department. Chart review indicates hx of same -Monitor intake and output -Continue gentle IV fluids -Recheck in the morning  #3. COPD. Patient reveals he was diagnosed with emphysema 2 years ago. No history of smoking. Not on home oxygen. Home medications include  inhalers. Chart review indicates CT of the chest in May 2018 reveals centrilobular emphysema nodules. PFT September 2018 PFT 05/25/17 >> FEV1 1.96 (53%), FEV1% 52, TLC 8.05 (115%), DLCO 46%, + BD. On admission oxygen saturation level greater than 90% on room air -Continue home meds -Monitor  4. CLL WBC 90. Improved form earlier.  Chart review indicates this is improvement. Followed at [facility]. Chart review indicates early stage Rai I-CLL. Seen by local who recommended no treatment.  -chart review saw oncology at [facility] last month. Plan no treatment    DVT prophylaxis: lovenox  Code Status: full  Family Communication: none present  Disposition Plan: home  Consults called: cardmaster  Admission status: obs    Dyanne Carrel M MD [facility]  If 7PM-7AM, please contact night-coverage [email or web address] Password [facility]  03/25/2018, 12:18 PM

## 2018-03-25 NOTE — ED Notes (Signed)
MD aware of critical WBC lab

## 2018-03-25 NOTE — Consult Note (Signed)
Cardiology Consultation:   Patient ID: Melvin Martin; [id number]; 1956/05/07   Admit date: 03/25/2018 Date of Consult: 03/25/2018  Primary Care Provider: Marton Redwood, MD Primary Cardiologist: Peter [name], MD   Patient Profile:   Melvin Martin is a 62 y.o. male with a hx of CAD status post DES to ramus and LAD in December 2013, hyperlipidemia, GERD, emphysema, asthmatic bronchitis and diagnosis of CLL (not on any treatment) who is being seen today for the evaluation of chest pain at the request of Dr. Cruzita Lederer.   Last seen by Dr. [name] 07/2014.  Low risk Myoview January 2016.  No regular cardiology follow-up since then.  History of Present Illness:   [name] presented for evaluation of left-sided achy chest pain.  He states that he had a "heated exchange" with her ex-wife last night.  During that time, he started to having left-sided pain which is resolved but never went away.  It was different from his chronic intermittent pain which she described as an achy sensation at the epigastric area.  He attributed this to chronic GERD and emphysema.  His symptom is not similar to prior angina when he had a non-STEMI in 2013.    Past Medical History:  Diagnosis Date  . ADD (attention deficit disorder)   . Asthmatic bronchitis   . Cancer ([place])   . Chest pain    pt relates to GERDS and asthmatic bronchitis  . COPD (chronic obstructive pulmonary disease) ([place])   . Coronary artery disease   . GERD (gastroesophageal reflux disease)   . Hyperlipidemia LDL goal < 70   . Low testosterone   . NSTEMI (non-ST elevated myocardial infarction) [facility]) December 2013   with PCI with DES to the ramus and LAD    Past Surgical History:  Procedure Laterality Date  . CORONARY ANGIOPLASTY WITH STENT PLACEMENT  08/18/2012   DES to the ramus and LAD  . Cyst removal, mouth    . LEFT HEART CATHETERIZATION WITH CORONARY ANGIOGRAM N/A 08/18/2012   Procedure: LEFT HEART CATHETERIZATION WITH CORONARY  ANGIOGRAM;  Surgeon: Sherren Mocha, MD;  Location: [facility] CATH LAB;  Service: Cardiovascular;  Laterality: N/A;  . PERCUTANEOUS CORONARY STENT INTERVENTION (PCI-S)  08/18/2012   Procedure: PERCUTANEOUS CORONARY STENT INTERVENTION (PCI-S);  Surgeon: Sherren Mocha, MD;  Location: [facility] CATH LAB;  Service: Cardiovascular;;     Inpatient Medications: Scheduled Meds: . [START ON 03/26/2018] aspirin EC  325 mg Oral Daily  . enoxaparin (LOVENOX) injection  40 mg Subcutaneous Q24H  . [START ON 03/26/2018] mirabegron ER  50 mg Oral Daily  . pantoprazole  80 mg Oral Daily  . rosuvastatin  10 mg Oral Daily  . tamsulosin  0.4 mg Oral QHS  . triamcinolone  1 spray Nasal Daily  . [START ON 03/26/2018] vitamin C  250 mg Oral Daily   Continuous Infusions: . calcium gluconate     PRN Meds: acetaminophen, gi cocktail, Ipratropium-Albuterol, morphine injection, nitroGLYCERIN, ondansetron (ZOFRAN) IV  Allergies:    Allergies  Allergen Reactions  . Metoprolol Shortness Of Breath    Caused BP to increase     Social History:   Social History   Socioeconomic History  . Marital status: Legally Separated    Spouse name: Not on file  . Number of children: Not on file  . Years of education: Not on file  . Highest education level: Not on file  Occupational History  . Not on file  Social Needs  . Financial resource strain: Not  on file  . Food insecurity:    Worry: Not on file    Inability: Not on file  . Transportation needs:    Medical: Not on file    Non-medical: Not on file  Tobacco Use  . Smoking status: Never Smoker  . Smokeless tobacco: Never Used  Substance and Sexual Activity  . Alcohol use: No  . Drug use: No  . Sexual activity: Yes  Lifestyle  . Physical activity:    Days per week: Not on file    Minutes per session: Not on file  . Stress: Not on file  Relationships  . Social connections:    Talks on phone: Not on file    Gets together: Not on file    Attends religious service:  Not on file    Active member of club or organization: Not on file    Attends meetings of clubs or organizations: Not on file    Relationship status: Not on file  . Intimate partner violence:    Fear of current or ex partner: Not on file    Emotionally abused: Not on file    Physically abused: Not on file    Forced sexual activity: Not on file  Other Topics Concern  . Not on file  Social History Narrative   Lives with wife and 2 small children    Family History:   Family History  Problem Relation Age of Onset  . Emphysema Mother   . Heart failure Father   . Heart attack Father   . Heart attack Cousin      ROS:  Please see the history of present illness.  All other ROS reviewed and negative.     Physical Exam/Data:   Vitals:   03/25/18 1156 03/25/18 1200 03/25/18 1245 03/25/18 1315  BP: 128/83 131/83 (!) 122/91 124/79  Pulse: 76 73 81 73  Resp: 15 15 (!) 22 19  Temp:      TempSrc:      SpO2: 95% 94% 91% 90%    Intake/Output Summary (Last 24 hours) at 03/25/2018 1402 Last data filed at 03/25/2018 1339 Gross per 24 hour  Intake 500 ml  Output 1250 ml  Net -750 ml   There were no vitals filed for this visit. There is no height or weight on file to calculate BMI.  General:  Well nourished, well developed, in no acute distress HEENT: normal Lymph: no adenopathy Neck: no JVD Endocrine:  No thryomegaly Vascular: No carotid bruits; FA pulses 2+ bilaterally without bruits  Cardiac:  normal S1, S2; RRR; no murmur  Lungs:  clear to auscultation bilaterally, no wheezing, rhonchi or rales  Abd: soft, nontender, no hepatomegaly  Ext: no edema Musculoskeletal:  No deformities, BUE and BLE strength normal and equal Skin: warm and dry  Neuro:  CNs 2-12 intact, no focal abnormalities noted Psych:  Normal affect   EKG:  The EKG was personally reviewed and demonstrates: Sinus rhythm with tall peaked T wave Telemetry:  Telemetry was personally reviewed and demonstrates: Sinus  rhythm  Relevant CV Studies:  Cath 08/2012 Coronary angiography: Coronary dominance: right  Left mainstem: widely patent  Left anterior descending (LAD): Heavily diseased vessel with ectasia and nonobstructive proximal plaque. The mid-vessel has diffuse 75% stenosis with irregularity and lesion hypodensity. The distal vessel is patent without disease. There is small D2 branch with total occlusion and contrast stain.  Left circumflex (LCx): Large vessel, Ramus intermedius has critical 99% stenosis in the mid portion and  mild nonobstructive disease in the prox vessel. The AV groove circ and OM branches are patent with nonobstructive disease.   Right coronary artery (RCA): Dominant, diffuse irregularity but no significant angiographic disease. PDA and PLA branches patent.   Left ventriculography: Left ventricular systolic function preserved with an LVEF of 55%. There is mild hypokinesis of the anterolateral wall.   PCI Note:  Following the diagnostic procedure, the decision was made to proceed with PCI. There was critical stenosis of the ramus branch and severe stenosis with hypodensity and irregularity in the mid-LAD. The radial sheath was upsized to a 6 [place]. Weight-based bivalirudin was given for anticoagulation. Effient 60 mg was administered. Once a therapeutic ACT was achieved, a 6 [place] XB-LAD 3.5 cm  guide catheter was inserted.  A BMW coronary guidewire was used to cross the lesion in the Ramus.  The lesion was predilated with a 2.5x15 mm balloon.  The lesion was then stented with a 2.75 x 16 mm  Promus Premier DES stent.  The stent was postdilated with a 3.0 x 12 mm noncompliant balloon.  Following PCI, there was 0% residual stenosis and TIMI-3 flow. Attention was then turned to the LAD. The vessel was wired with the same BMW wire. A 3.75x20 mm [place] balloon was used to measure the length of the lesion (it was not dilated). The lesion was then stented with a 3.5x32 mm Promus Premier  DES. The stent was post-dilated with the 3.75 [place] balloon to high pressure. Final angiography confirmed an excellent result with TIMI-3 flow and 0% residual stenosis. The patient tolerated the procedure well. There were no immediate procedural complications. A TR band was used for radial hemostasis. The patient was transferred to the post catheterization recovery area for further monitoring.  PCI Data: LESION 1 Vessel - Ramus/Segment - mid Percent Stenosis (pre)  99 TIMI-flow 3 Stent 2.75x16 mm Promus DES Percent Stenosis (post) 0 TIMI-flow (post) 3  LESION 2 Vessel - LAD/Segment - mid Percent Stenosis (pre)  75 TIMI-flow 3 Stent 3.5x32 mm Promus DES Percent Stenosis (post) 0 TIMI-flow (post) 3  Final Conclusions:   1. 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 Nonobstructive RCA stenosis 3. Mild LV contraction abnormality with overall normal LVEF 55%  Recommendations:  DAPT with ASA and effient x 12 months, aggressive risk modification.  Laboratory Data:  Chemistry Recent Labs  Lab 03/20/18 1812 03/25/18 0919 03/25/18 1130  NA 138 141  --   K 4.1 5.8* 6.3*  CL 105 107  --   CO2 26 28  --   GLUCOSE 98 97  --   BUN 21* 13  --   CREATININE 1.03 1.10  --   CALCIUM 8.7* 9.3  --   GFRNONAA >60 >60  --   GFRAA >60 >60  --   ANIONGAP 7 6  --     Recent Labs  Lab 03/20/18 1812  PROT 6.5  ALBUMIN 4.0  AST 23  ALT 18  ALKPHOS 64  BILITOT 0.7   Hematology Recent Labs  Lab 03/20/18 1812 03/25/18 0919  WBC 133.0* 90.9*  RBC 4.06* 3.99*  HGB 13.6 12.9*  HCT 40.3 40.1  MCV 99.3 100.5*  MCH 33.5 32.3  MCHC 33.7 32.2  RDW 14.4 14.0  PLT 256 223    Recent Labs  Lab 03/25/18 0924  TROPIPOC 0.00    Radiology/Studies:  Dg Chest 2 View  Result Date: 03/25/2018 CLINICAL DATA:  Chest pain. EXAM:  CHEST - 2 VIEW COMPARISON:  CT chest 01/07/2017 FINDINGS: The heart size is normal. No pleural effusion identified. A coronary  artery stent is noted within the LAD. The lungs are hyperinflated but clear. Chronic interstitial coarsening of emphysema identified. Scar like densities identified within both lower lobes. IMPRESSION: 1. No acute cardiopulmonary abnormalities. 2. Bibasilar scarring and chronic changes of emphysema. Aortic Atherosclerosis (ICD10-I70.0). Electronically Signed   By: Kerby Moors M.D.   On: 03/25/2018 09:57    Assessment and Plan:   1. Chest pain -Seems atypical.  Symptoms different from prior angina when he had a non-STEMI in 2013.  Likely exacerbated by stressful situation.  Chronic intermittent atypical epigastric pain is likely due to GERD and emphysema.  He exercise on elliptical 5-6 times per week without any reproducible symptoms. -No inpatient work-up planned.  Continue cycle troponin for rule out.  Continue aspirin and statin.  2.  Hyperlipidemia -Continue statin.  3.  CLL -Followed by oncology at [facility].  WBC very high.  Per primary team.   For questions or updates, please contact [place] Please consult [email or web address] for contact info under Cardiology/STEMI.   Mahalia Longest [place], [place]  03/25/2018 2:02 PM

## 2018-03-25 NOTE — ED Notes (Signed)
Attempted report, 3E states that they will not take pt d/t pt having active CP; bed placement notified

## 2018-03-25 NOTE — ED Triage Notes (Signed)
Pt reports chest pain that began last night during an argument, reports hx of MI, states this pain feels different. Pt reports pain has been left sided, denies arm or neck pain. Pt has hx of emphysema, endorses some sob at baseline. Pt a/ox4, resp e/u, nad.

## 2018-03-25 NOTE — ED Provider Notes (Signed)
[facility] EMERGENCY DEPARTMENT Provider Note   CSN: [id number] Arrival date & time: 03/25/18  0901     History   Chief Complaint Chief Complaint  Patient presents with  . Chest Pain    HPI Melvin Martin is a 62 y.o. male.  The history is provided by the patient. No language interpreter was used.  Chest Pain       62 year old male with known history of chronic lymphocytic leukemia, GERD, COPD, prior NSTEMI presenting for evaluation of chest pain.  Patient report last night he was involved in a "heated discussion" with his daughter and shortly after he developed pain to his chest.  He described as a pressure sensation to the mid chest which has since subsided but pain seems to be radiates towards the left side of chest which concerns him.  Currently rates pain as 1 out of 10 and very minimal.  Pain is not associate with lightheadedness, dizziness, nausea, shortness of breath, or with exertion.  He did report having a prior MI 5 years ago, and endorsed history of COPD and CLL along with GERD.  He felt that the symptoms may be brought on by stress but would like to be evaluated to ensure it is not cardiac related.  No report of fever or chills, headache, productive cough, hemoptysis, back pain, abdominal pain or arm pain.  Patient is currently receiving care both pulmonology and oncology through [facility].  His cardiologist is Dr. Peter [name] in which he has an appointment in September.  Past Medical History:  Diagnosis Date  . ADD (attention deficit disorder)   . Asthmatic bronchitis   . Cancer ([place])   . Chest pain    pt relates to GERDS and asthmatic bronchitis  . COPD (chronic obstructive pulmonary disease) ([place])   . Coronary artery disease   . GERD (gastroesophageal reflux disease)   . Hyperlipidemia LDL goal < 70   . Low testosterone   . NSTEMI (non-ST elevated myocardial infarction) [facility]) December 2013   with PCI with DES to the ramus and LAD    Patient  Active Problem List   Diagnosis Date Noted  . CLL (chronic lymphocytic leukemia) ([place]) 01/06/2017  . Coronary artery disease   . Non-ST elevation myocardial infarction (NSTEMI), initial care episode ([place]) 08/18/2012  . GERD (gastroesophageal reflux disease)   . Dyslipidemia (high LDL; low HDL)     Past Surgical History:  Procedure Laterality Date  . CORONARY ANGIOPLASTY WITH STENT PLACEMENT  08/18/2012   DES to the ramus and LAD  . Cyst removal, mouth    . LEFT HEART CATHETERIZATION WITH CORONARY ANGIOGRAM N/A 08/18/2012   Procedure: LEFT HEART CATHETERIZATION WITH CORONARY ANGIOGRAM;  Surgeon: Sherren Mocha, MD;  Location: [facility] C[facility] LAB;  Service: Cardiovascular;  Laterality: N/A;  . PERCUTANEOUS CORONARY STENT INTERVENTION (PCI-S)  08/18/2012   Procedure: PERCUTANEOUS CORONARY STENT INTERVENTION (PCI-S);  Surgeon: Sherren Mocha, MD;  Location: [facility] CATH LAB;  Service: Cardiovascular;;        Home Medications    Prior to Admission medications   Medication Sig Start Date End Date Taking? Authorizing Provider  Ascorbic Acid (VITAMIN C PO) Take 1-2 tablets by mouth daily.     [provider]  aspirin 81 MG chewable tablet Chew 1 tablet (81 mg total) by mouth daily. 08/19/12   Barrett, Evelene Croon, PA-C  CONCERTA 27 MG CR tablet  06/21/14   [provider]  CRESTOR 20 MG tablet Take 10 mg by  mouth daily.  07/17/14   [provider]  Lactobacillus Rhamnosus, GG, (CULTURELLE PO) Take 1 tablet by mouth as needed.    [provider]  Multiple Vitamin (MULTIVITAMIN) tablet Take 1 tablet by mouth daily.    [provider]  nitroGLYCERIN (NITROSTAT) 0.4 MG SL tablet Place 1 tablet (0.4 mg total) under the tongue every 5 (five) minutes as needed for chest pain. 09/17/15   [name], [name], MD  OMEGA-3 KRILL OIL PO Take 750 mg by mouth 2 (two) times daily.     [provider]  omeprazole (PRILOSEC) 40 MG capsule Take 40 mg by mouth daily.     [provider]  Testosterone (TESTOPEL) 75 MG PLLT 1 each by Implant route as directed. Patient states that he receives this medication every 5 month for low testosterone 16 pellets last done at 1/30    [provider]  umeclidinium-vilanterol (ANORO ELLIPTA) 62.5-25 MCG/INH AEPB Inhale 1 puff into the lungs daily.    [provider]    Family History Family History  Problem Relation Age of Onset  . Emphysema Mother   . Heart failure Father   . Heart attack Father   . Heart attack Cousin     Social History Social History   Tobacco Use  . Smoking status: Never Smoker  . Smokeless tobacco: Never Used  Substance Use Topics  . Alcohol use: No  . Drug use: No     Allergies   Metoprolol   Review of Systems Review of Systems  Cardiovascular: Positive for chest pain.  All other systems reviewed and are negative.    Physical Exam Updated Vital Signs BP 102/60   Pulse 70   Temp 98 F (36.7 C) (Oral)   Resp 17   SpO2 92%   Physical Exam  Constitutional: He appears well-developed and well-nourished. No distress.  HENT:  Head: Atraumatic.  Eyes: Conjunctivae are normal.  Neck: Neck supple.  Cardiovascular: Normal rate, regular rhythm, intact distal pulses and normal pulses.  Pulmonary/Chest: Effort normal. He has no decreased breath sounds. He has no wheezes. He has no rhonchi. He has no rales.  Abdominal: Soft. There is no tenderness.  Musculoskeletal: Normal range of motion.       Right lower leg: He exhibits no edema.       Left lower leg: He exhibits no edema.  Neurological: He is alert.  Skin: No rash noted.  Psychiatric: He has a normal mood and affect.  Nursing note and vitals reviewed.    ED Treatments / Results  Labs (all labs ordered are listed, but only abnormal results are displayed) Labs Reviewed  BASIC METABOLIC PANEL - Abnormal; Notable for the following components:      Result Value   Potassium 5.8 (*)    All other  components within normal limits  CBC - Abnormal; Notable for the following components:   WBC 90.9 (*)    RBC 3.99 (*)    Hemoglobin 12.9 (*)    MCV 100.5 (*)    All other components within normal limits  POTASSIUM  POTASSIUM  POTASSIUM  POTASSIUM  I-STAT TROPONIN, ED    EKG EKG Interpretation  Date/Time:  Friday March 25 2018 09:11:26 EDT Ventricular Rate:  78 PR Interval:  172 QRS Duration: 76 QT Interval:  350 QTC Calculation: 399 R Axis:   53 Text Interpretation:  Normal sinus rhythm `prominent/peaked t waves Confirmed by Lajean Saver [phone]) on 03/25/2018 10:57:47 AM   ED  ECG REPORT   Date: 03/25/2018  Rate: 78  Rhythm: normal sinus rhythm  QRS Axis: normal  Intervals: normal  ST/T Wave abnormalities: nonspecific ST changes  Conduction Disutrbances:none  Narrative Interpretation:   Old EKG Reviewed: unchanged  I have personally reviewed the EKG tracing and agree with the computerized printout as noted.   Radiology Dg Chest 2 View  Result Date: 03/25/2018 CLINICAL DATA:  Chest pain. EXAM: CHEST - 2 VIEW COMPARISON:  CT chest 01/07/2017 FINDINGS: The heart size is normal. No pleural effusion identified. A coronary artery stent is noted within the LAD. The lungs are hyperinflated but clear. Chronic interstitial coarsening of emphysema identified. Scar like densities identified within both lower lobes. IMPRESSION: 1. No acute cardiopulmonary abnormalities. 2. Bibasilar scarring and chronic changes of emphysema. Aortic Atherosclerosis (ICD10-I70.0). Electronically Signed   By: Kerby Moors M.D.   On: 03/25/2018 09:57    Procedures .Critical Care Performed by: Domenic Moras, PA-C Authorized by: Domenic Moras, PA-C   Critical care provider statement:    Critical care time (minutes):  35   Critical care start time:  03/25/2018 10:18 AM   Critical care end time:  03/25/2018 11:47 AM   Critical care time was exclusive of:  Separately billable procedures and treating other  patients   Critical care was necessary to treat or prevent imminent or life-threatening deterioration of the following conditions:  Metabolic crisis   Critical care was time spent personally by me on the following activities:  Blood draw for specimens, development of treatment plan with patient or surrogate, discussions with consultants, evaluation of patient's response to treatment, examination of patient, obtaining history from patient or surrogate, ordering and performing treatments and interventions, ordering and review of laboratory studies and re-evaluation of patient's condition   I assumed direction of critical care for this patient from another provider in my specialty: no     (including critical care time)  Medications Ordered in ED Medications  sodium polystyrene (KAYEXALATE) 15 GM/60ML suspension 30 g (has no administration in time range)  furosemide (LASIX) injection 40 mg (has no administration in time range)  sodium chloride 0.9 % bolus 500 mL (has no administration in time range)     Initial Impression / Assessment and Plan / ED Course  I have reviewed the triage vital signs and the nursing notes.  Pertinent labs & imaging results that were available during my care of the patient were reviewed by me and considered in my medical decision making (see chart for details).     BP 110/69   Pulse 70   Temp 98 F (36.7 C) (Oral)   Resp 17   SpO2 91%    Final Clinical Impressions(s) / ED Diagnoses   Final diagnoses:  Atypical chest pain  Hyperkalemia  Personal history of CLL (chronic lymphocytic leukemia)    ED Discharge Orders    None     10:18 AM Patient with cardiac history here with pain to his left side chest.  This was brought on by stress.  However given history of cardiac disease, work-up initiated.  10:46 AM EKG with evidence of peaked T waves, elevated potassium of 5.8.  Will provide medication to help decrease his potassium level.  Medications include  Lasix, IV fluid, and Kayexalate.  Patient also has leukocytosis with WBC in the 90,000 however this has improved from recent blood draw a week ago.  This is related to his CLL.  Care discussed with Dr. Ashok Cordia.  11:49 AM Appreciate consultation  from triad hospitalist, Dyanne Carrel, NP, who agrees to see patient in the ER and was admitted for further management of his condition.  Patient voiced understanding and agrees with plan.  He is currently hemodynamically stable.  No significant pain.   Domenic Moras, PA-C 03/25/18 1151    Lajean Saver, MD 03/25/18 [phone]

## 2018-03-26 ENCOUNTER — Other Ambulatory Visit: Payer: Self-pay | Admitting: Cardiology

## 2018-03-26 DIAGNOSIS — R079 Chest pain, unspecified: Secondary | ICD-10-CM

## 2018-03-26 DIAGNOSIS — I208 Other forms of angina pectoris: Secondary | ICD-10-CM

## 2018-03-26 LAB — BASIC METABOLIC PANEL
Anion gap: 8 (ref 5–15)
BUN: 18 mg/dL (ref 6–20)
CALCIUM: 8.8 mg/dL — AB (ref 8.9–10.3)
CO2: 28 mmol/L (ref 22–32)
CREATININE: 1.27 mg/dL — AB (ref 0.61–1.24)
Chloride: 102 mmol/L (ref 98–111)
GFR calc Af Amer: >60 mL/min (ref >60)
GLUCOSE: 101 mg/dL — AB (ref 70–99)
Potassium: 6.2 mmol/L — ABNORMAL HIGH (ref 3.5–5.1)
Sodium: 138 mmol/L (ref 135–145)

## 2018-03-26 LAB — CBC
HEMATOCRIT: 39.5 % (ref 39.0–52.0)
HEMOGLOBIN: 13.2 g/dL (ref 13.0–17.0)
MCH: 33.2 pg (ref 26.0–34.0)
MCHC: 33.4 g/dL (ref 30.0–36.0)
MCV: 99.2 fL (ref 78.0–100.0)
PLATELETS: 223 10*3/uL (ref 150–400)
RBC: 3.98 MIL/uL — AB (ref 4.22–5.81)
RDW: 13.9 % (ref 11.5–15.5)
WBC: 86.2 10*3/uL (ref 4.0–10.5)

## 2018-03-26 LAB — POTASSIUM
Potassium: 5.5 mmol/L — ABNORMAL HIGH (ref 3.5–5.1)
Potassium: 5.4 mmol/L — ABNORMAL HIGH (ref 3.5–5.1)

## 2018-03-26 LAB — TROPONIN I: Troponin I: <0.03 ng/mL (ref <0.03)

## 2018-03-26 MED ORDER — ASPIRIN 325 MG PO TBEC: 325.0000 mg | DELAYED_RELEASE_TABLET | Freq: Every day | ORAL | 0 refills | Status: DC

## 2018-03-26 NOTE — Progress Notes (Signed)
Discharged to home with family office visits in place teaching done  

## 2018-03-26 NOTE — Discharge Instructions (Signed)
You will be scheduled for outpatientt exercise nuclear stress test.  The office will call you with date and time.  You will be nothing to eat or drink 3 hours prior to test on only light meal. Wear tennis shoes to walk on treadmill No strong deodorant or cologne

## 2018-03-26 NOTE — Progress Notes (Signed)
Subjective:  Brought in for observation for atypical chest discomfort.  The discomfort has abated and his EKG and enzymes are normal.  Objective:  Vital Signs in the last 24 hours: BP 107/73 (BP Location: Right Arm)   Pulse 70   Temp 98.4 F (36.9 C) (Oral)   Resp 18   Ht 5\' 10"  (1.778 m)   Wt 78 kg (171 lb 15.3 oz)   SpO2 92%   BMI 24.67 kg/m   Physical Exam: Pleasant male in no acute distress Lungs:  Clear Cardiac:  Regular rhythm, normal S1 and S2, no S3 Extremities:  No edema present  Intake/Output from previous day: 07/19 0701 - 07/20 0700 In: 500 [IV Piggyback:500] Out: 1250 [Urine:1250]  Weight Filed Weights   03/25/18 1457  Weight: 78 kg (171 lb 15.3 oz)    Lab Results: Basic Metabolic Panel: Recent Labs    03/25/18 0919  03/26/18 0339 03/26/18 0711  NA 141  --   --  138  K 5.8*   < > 5.5* 6.2*  CL 107  --   --  102  CO2 28  --   --  28  GLUCOSE 97  --   --  101*  BUN 13  --   --  18  CREATININE 1.10  --   --  1.27*   < > = values in this interval not displayed.   CBC: Recent Labs    03/25/18 1430 03/26/18 0711  WBC 101.9* 86.2*  NEUTROABS 11.2*  --   HGB 14.0 13.2  HCT 42.5 39.5  MCV 99.1 99.2  PLT 234 223   Cardiac Enzymes: Troponin (Point of Care Test) Recent Labs    03/25/18 0924  TROPIPOC 0.00   Cardiac Panel (last 3 results) Recent Labs    03/25/18 1430 03/26/18 0711  TROPONINI <0.03 <0.03    Telemetry: sinus rhythm   Assessment/Plan:  1.  Atypical chest discomfort 2.  Hyperkalemia 3.   CLL currently considering treatment  Recommendations:  From cardiovascular viewpoint I think he can be discharged and have an outpatient stress test  His 12-lead EKG is completely normal.I will ask for the service to try to arrange this.    Kerry Hough  MD [facility] Cardiology  03/26/2018, 9:36 AM

## 2018-03-30 ENCOUNTER — Telehealth (HOSPITAL_COMMUNITY): Payer: Self-pay

## 2018-03-30 NOTE — Telephone Encounter (Signed)
Encounter complete. 

## 2018-03-31 ENCOUNTER — Telehealth (HOSPITAL_COMMUNITY): Payer: Self-pay

## 2018-03-31 NOTE — Telephone Encounter (Signed)
Encounter complete. 

## 2018-04-01 ENCOUNTER — Ambulatory Visit (HOSPITAL_COMMUNITY)
Admission: RE | Admit: 2018-04-01 | Discharge: 2018-04-01 | Disposition: A | Discharge status: Home / Self Care | Payer: Managed Care, Other (non HMO) | Source: Ambulatory Visit | Attending: Cardiology | Admitting: Cardiology

## 2018-04-01 DIAGNOSIS — R079 Chest pain, unspecified: Secondary | ICD-10-CM | POA: Diagnosis present

## 2018-04-01 LAB — MYOCARDIAL PERFUSION IMAGING
CHL CUP NUCLEAR SRS: 0
CHL CUP NUCLEAR SSS: 3
CHL CUP RESTING HR STRESS: 69 {beats}/min
CSEPEDS: 46 s
CSEPEW: 10.1 METS
CSEPHR: 86 %
CSEPPHR: 139 {beats}/min
Exercise duration (min): 8 min
LV dias vol: 163 mL (ref 62–150)
LV sys vol: 78 mL
MPHR: 161 {beats}/min
RPE: 19
SDS: 3
TID: 1.07

## 2018-04-01 MED ORDER — TECHNETIUM TC 99M TETROFOSMIN IV KIT
30.5000 | PACK | Freq: Once | INTRAVENOUS | Status: AC | PRN
  Administered 2018-04-01: 30.5 via INTRAVENOUS
  Filled 2018-04-01: qty 31

## 2018-04-01 MED ORDER — TECHNETIUM TC 99M TETROFOSMIN IV KIT
10.1000 | PACK | Freq: Once | INTRAVENOUS | Status: AC | PRN
  Administered 2018-04-01: 10.1 via INTRAVENOUS
  Filled 2018-04-01: qty 11

## 2018-05-10 NOTE — Discharge Summary (Signed)
Melvin Martin, is a 62 y.o. male  DOB August 18, 1956  MRN [id number].  Admission date:  03/25/2018  Admitting Physician  Caren Griffins, MD  Discharge Date:  05/10/2018   Primary MD  Marton Redwood, MD  Recommendations for primary care physician for things to follow:   Outpatient stress test by Cardiology   Admission Diagnosis  Hyperkalemia [E87.5] Atypical chest pain [R07.89] Personal history of CLL (chronic lymphocytic leukemia) [Z85.6]   Discharge Diagnosis  Hyperkalemia [E87.5] Atypical chest pain [R07.89] Personal history of CLL (chronic lymphocytic leukemia) [Z85.6]    Principal Problem:   Chest pain Active Problems:   GERD (gastroesophageal reflux disease)   Dyslipidemia (high LDL; low HDL)   Coronary artery disease   CLL (chronic lymphocytic leukemia) (HCC)   Hyperkalemia   COPD (chronic obstructive pulmonary disease) (HCC)      Past Medical History:  Diagnosis Date  . ADD (attention deficit disorder)   . Asthmatic bronchitis   . Cancer ([place])   . Chest pain    pt relates to GERDS and asthmatic bronchitis  . COPD (chronic obstructive pulmonary disease) ([place])   . Coronary artery disease   . GERD (gastroesophageal reflux disease)   . Hyperlipidemia LDL goal < 70   . Low testosterone   . NSTEMI (non-ST elevated myocardial infarction) [facility]) December 2013   with PCI with DES to the ramus and LAD    Past Surgical History:  Procedure Laterality Date  . CORONARY ANGIOPLASTY WITH STENT PLACEMENT  08/18/2012   DES to the ramus and LAD  . Cyst removal, mouth    . LEFT HEART CATHETERIZATION WITH CORONARY ANGIOGRAM N/A 08/18/2012   Procedure: LEFT HEART CATHETERIZATION WITH CORONARY ANGIOGRAM;  Surgeon: Sherren Mocha, MD;  Location: [facility] CATH LAB;  Service: Cardiovascular;  Laterality: N/A;  . PERCUTANEOUS CORONARY STENT INTERVENTION (PCI-S)  08/18/2012   Procedure: PERCUTANEOUS CORONARY  STENT INTERVENTION (PCI-S);  Surgeon: Sherren Mocha, MD;  Location: [facility] CATH LAB;  Service: Cardiovascular;;       HPI  from the history and physical done on the day of admission:    Melvin Martin is a 62 y.o. male with medical history significant for CL L, CAD status post stents 2 2013, prior NSTEMI COPD not on home oxygen, GERD, presents to the emergency Department chief complaint chest pain. [facility] are asked to admit  Information is obtained from the patient and the chart. He states yesterday evening he was involved in a "heated discussion" with his ex-wife. Shortly thereafter he developed chest "pressure. He states the location of this was left anterior and radiated to the center on occasion. He waited this pressure of 1 out of 10. He states he did fluctuate somewhat go to a 2. He reports this pain was different from when he had his MI. He denies any diaphoresis nausea vomiting worsening shortness of breath. He denies headache dizziness syncope or near-syncope. He denies any palpitations lower extremity edema. He denies dysuria hematuria frequency or urgency. States the pain was  the same no matter what his position or activity. He states he has not seen his local cardiologist in 2 years but does have an appointment in September.    ED Course: in the emergency department he is afebrile hemodynamically stable and not hypoxic.     Hospital Course:   patient was admitted to telemetry monitoring without any malignant arrhythmias noted.  CV are cardiac enzymes were negative.  His hyperkalemia resolved with treatment,  Lasix and Kayexalate the ED.   By next morning his chest pain had resolved. Leukocytosis due to CLL was followed and remained fairly stable throughout hospitalization and he is follow up with  Hematologist at [facility].    Patient was seen by cardiology consultant Dr. Wynonia Lawman who recommended outpatient workup stress  Test.    Discharge Condition:   Stable,satisfactory  Follow UP  Follow-up Information    [name], [name], MD Follow up.   Specialty:  Cardiology Why:  the office will call you with date and time of appt and stress test Contact information: [address] [place] [address] [phone]            Consults obtained -  Cardiology, Dr. Wynonia Lawman  Diet and Activity recommendation:  As advised  Discharge Instructions     Discharge Instructions    Call MD for:  difficulty breathing, headache or visual disturbances   Complete by:  As directed    Call MD for:  extreme fatigue   Complete by:  As directed    Call MD for:  hives   Complete by:  As directed    Call MD for:  persistant dizziness or light-headedness   Complete by:  As directed    Call MD for:  persistant nausea and vomiting   Complete by:  As directed    Call MD for:  redness, tenderness, or signs of infection (pain, swelling, redness, odor or green/yellow discharge around incision site)   Complete by:  As directed    Call MD for:  severe uncontrolled pain   Complete by:  As directed    Call MD for:  temperature >100.4   Complete by:  As directed    Diet - low sodium heart healthy   Complete by:  As directed    Increase activity slowly   Complete by:  As directed         Discharge Medications     Allergies as of 03/26/2018      Reactions   Metoprolol Shortness Of Breath   Caused BP to increase       Medication List    STOP taking these medications   aspirin 81 MG chewable tablet Replaced by:  aspirin 325 MG EC tablet     TAKE these medications   aspirin 325 MG EC tablet Take 1 tablet (325 mg total) by mouth daily. Replaces:  aspirin 81 MG chewable tablet   CLOBETASOL PROPIONATE E EX Apply 1 application topically as needed (itching).   COMBIVENT RESPIMAT 20-100 MCG/ACT Aers respimat Generic drug:  Ipratropium-Albuterol Inhale 1 puff into the lungs 4 (four) times daily as needed for wheezing.   CRESTOR 20 MG  tablet Generic drug:  rosuvastatin Take 10 mg by mouth daily.   diclofenac sodium 1 % Gel Commonly known as:  VOLTAREN Apply 2 g topically as needed (joint pain).   eucerin cream Apply 1 application topically as needed for dry skin.   MYRBETRIQ 50 MG Tb24 tablet Generic drug:  mirabegron ER Take  50 mg by mouth daily.   nitroGLYCERIN 0.4 MG SL tablet Commonly known as:  NITROSTAT Place 1 tablet (0.4 mg total) under the tongue every 5 (five) minutes as needed for chest pain.   omeprazole 40 MG capsule Commonly known as:  PRILOSEC Take 40 mg by mouth daily.   PREVIDENT 5000 BOOSTER DT Place 1 application onto teeth as needed (dental health).   SALMON OIL-1000 PO Take 1,000 capsules by mouth 3 (three) times daily.   STIOLTO RESPIMAT 2.5-2.5 MCG/ACT Aers Generic drug:  Tiotropium Bromide-Olodaterol Inhale 2 puffs into the lungs daily.   tamsulosin 0.4 MG Caps capsule Commonly known as:  FLOMAX Take 0.4 mg by mouth at bedtime.   TESTOPEL 75 MG Pllt Generic drug:  Testosterone 1 each by Implant route as directed. Patient states that he receives this medication every 5 month for low testosterone 16 pellets last done at 1/30   triamcinolone 55 MCG/ACT Aero nasal inhaler Commonly known as:  NASACORT Place 1 spray into the nose daily.   VITAMIN C PO Take 1-2 tablets by mouth daily.       Major procedures and Radiology Reports - PLEASE review detailed and final reports for all details, in brief -      No results found.  Micro Results     No results found for this or any previous visit (from the past 240 hour(s)).     Today   Subjective    Allin Frix today has no  Chest pain, no shortness of breath of breath.      Patient has been seen and examined prior to discharge   Objective   Blood pressure 107/73, pulse 70, temperature 98.4 F (36.9 C), temperature source Oral, resp. rate 18, height 5\' 10"  (1.778 Martin), weight 78 kg, SpO2 92 %.  No intake or  output data in the 24 hours ending 05/10/18 0631  Exam Gen:-  No acute distress HEENT:- [place].AT, MMM Neck-Supple Neck,No JVD,  Lungs- mostly clear CV- S1, S2 normal Abd-  +ve B.Sounds, Abd Soft, No tenderness,    Extremity/Skin:- Intact peripheral pulses    Data Review   CBC w Diff:  Lab Results  Component Value Date   WBC 86.2 (HH) 03/26/2018   HGB 13.2 03/26/2018   HGB 15.9 06/30/2017   HGB 17.3 (H) 12/31/2016   HCT 39.5 03/26/2018   HCT 47.3 06/30/2017   HCT 50.0 (H) 12/31/2016   PLT 223 03/26/2018   PLT 227 06/30/2017   LYMPHOPCT 87 03/25/2018   BANDSPCT 0 03/25/2018   MONOPCT 2 03/25/2018   EOSPCT 0 03/25/2018   BASOPCT 0 03/25/2018    CMP:  Lab Results  Component Value Date   NA 138 03/26/2018   NA 139 06/30/2017   K 5.4 (H) 03/26/2018   K 5.5 (H) 06/30/2017   CL 102 03/26/2018   CL 102 06/30/2017   CO2 28 03/26/2018   CO2 20 06/30/2017   BUN 18 03/26/2018   BUN 17 06/30/2017   CREATININE 1.27 (H) 03/26/2018   CREATININE 0.91 06/30/2017   PROT 6.5 03/20/2018   PROT 6.2 06/30/2017   PROT 6.6 06/30/2017   ALBUMIN 4.0 03/20/2018   ALBUMIN 4.3 06/30/2017   BILITOT 0.7 03/20/2018   BILITOT 0.3 06/30/2017   ALKPHOS 64 03/20/2018   ALKPHOS 48 06/30/2017   AST 23 03/20/2018   AST 24 06/30/2017   ALT 18 03/20/2018   ALT 27 06/30/2017  .   Total Discharge time is about 33 minutes  Benito Mccreedy Martin.D on 05/10/2018 at 6:31 AM  [facility]   Office  [phone]  Dragon dictation system was used to create this note, attempts have been made to correct errors, however presence of uncorrected errors is not a reflection quality of care provided

## 2018-05-26 ENCOUNTER — Encounter: Payer: Self-pay | Admitting: Cardiology

## 2018-05-30 ENCOUNTER — Ambulatory Visit: Payer: Managed Care, Other (non HMO) | Admitting: Cardiology

## 2018-06-06 NOTE — Progress Notes (Deleted)
Melvin Martin Date of Birth: 16-May-1956 Medical Record #[id number]  History of Present Illness: Melvin Martin is seen as a work in today for evaluation of chest pain. He was last seen in November 2015.   He has known CAD with NSTEMI in December of 2013 with PCI that included DES to the ramus and LAD. He has a history of HLD and GERD. 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   He was admitted in July with chest pain and ruled out for MI. Outpatient work up recommended and subsequent Myoview was low risk.   He reports that over the past week he has experienced increased chest discomfort. This is described as tightness in the lower chest and epigastrium. It comes and goes. It is worse as the day progresses. It is different than the pain he had with his MI. He admits to being under increased stress at home. On one occasion he noted he got winded and fatigued with exertion.    Current Outpatient Medications  Medication Sig Dispense Refill  . Ascorbic Acid (VITAMIN C PO) Take 1-2 tablets by mouth daily.     Marland Kitchen aspirin EC 325 MG EC tablet Take 1 tablet (325 mg total) by mouth daily. 30 tablet 0  . Clobetasol Prop Emollient Base (CLOBETASOL PROPIONATE E EX) Apply 1 application topically as needed (itching).    . CRESTOR 20 MG tablet Take 10 mg by mouth daily.   5  . diclofenac sodium (VOLTAREN) 1 % GEL Apply 2 g topically as needed (joint pain).    . Ipratropium-Albuterol (COMBIVENT RESPIMAT) 20-100 MCG/ACT AERS respimat Inhale 1 puff into the lungs 4 (four) times daily as needed for wheezing.    Marland Kitchen MYRBETRIQ 50 MG TB24 tablet Take 50 mg by mouth daily.  3  . nitroGLYCERIN (NITROSTAT) 0.4 MG SL tablet Place 1 tablet (0.4 mg total) under the tongue every 5 (five) minutes as needed for chest pain. 25 tablet 0  . Omega-3 Fatty Acids (SALMON OIL-1000 PO) Take 1,000 capsules by mouth 3 (three) times daily.    Marland Kitchen omeprazole (PRILOSEC) 40 MG capsule Take 40 mg by mouth  daily.    . Skin Protectants, Misc. (EUCERIN) cream Apply 1 application topically as needed for dry skin.    . Sodium Fluoride (PREVIDENT 5000 BOOSTER DT) Place 1 application onto teeth as needed (dental health).    . STIOLTO RESPIMAT 2.5-2.5 MCG/ACT AERS Inhale 2 puffs into the lungs daily.    . tamsulosin (FLOMAX) 0.4 MG CAPS capsule Take 0.4 mg by mouth at bedtime.    . Testosterone (TESTOPEL) 75 MG PLLT 1 each by Implant route as directed. Patient states that he receives this medication every 5 month for low testosterone 16 pellets last done at 1/30    . triamcinolone (NASACORT) 55 MCG/ACT AERO nasal inhaler Place 1 spray into the nose daily.     No current facility-administered medications for this visit.     Allergies  Allergen Reactions  . Metoprolol Shortness Of Breath    Caused BP to increase     Past Medical History:  Diagnosis Date  . ADD (attention deficit disorder)   . Asthmatic bronchitis   . Cancer ([place])   . Chest pain    pt relates to GERDS and asthmatic bronchitis  . COPD (chronic obstructive pulmonary disease) ([place])   . Coronary artery disease   . GERD (gastroesophageal reflux disease)   . Hyperlipidemia  LDL goal < 70   . Low testosterone   . NSTEMI (non-ST elevated myocardial infarction) [facility]) December 2013   with PCI with DES to the ramus and LAD    Past Surgical History:  Procedure Laterality Date  . CORONARY ANGIOPLASTY WITH STENT PLACEMENT  08/18/2012   DES to the ramus and LAD  . Cyst removal, mouth    . LEFT HEART CATHETERIZATION WITH CORONARY ANGIOGRAM N/A 08/18/2012   Procedure: LEFT HEART CATHETERIZATION WITH CORONARY ANGIOGRAM;  Surgeon: Sherren Mocha, MD;  Location: [facility] CATH LAB;  Service: Cardiovascular;  Laterality: N/A;  . PERCUTANEOUS CORONARY STENT INTERVENTION (PCI-S)  08/18/2012   Procedure: PERCUTANEOUS CORONARY STENT INTERVENTION (PCI-S);  Surgeon: Sherren Mocha, MD;  Location: [facility] CATH LAB;  Service: Cardiovascular;;    Social  History   Tobacco Use  Smoking Status Never Smoker  Smokeless Tobacco Never Used    Social History   Substance and Sexual Activity  Alcohol Use No    Family History  Problem Relation Age of Onset  . Emphysema Mother   . Heart failure Father   . Heart attack Father   . Heart attack Cousin     Review of Systems: The review of systems is per the HPI.  All other systems were reviewed and are negative.  Physical Exam: There were no vitals taken for this visit. Patient is very pleasant and in no acute distress. Skin is warm and dry. Color is normal.  HEENT is unremarkable. Normocephalic/atraumatic. PERRL. Sclera are nonicteric. Neck is supple. No masses. No JVD. Lungs are clear. Cardiac exam shows a regular rate and rhythm. Normal S1-2. No gallop or murmur. No chest wall tenderness to palpation. Abdomen is soft. Extremities are without edema. Gait and ROM are intact. No gross neurologic deficits noted.  LABORATORY DATA: Lab Results  Component Value Date   WBC 86.2 (HH) 03/26/2018   HGB 13.2 03/26/2018   HCT 39.5 03/26/2018   PLT 223 03/26/2018   GLUCOSE 101 (H) 03/26/2018   CHOL 66 03/25/2018   TRIG 109 03/25/2018   HDL 12 (L) 03/25/2018   LDLCALC 32 03/25/2018   ALT 18 03/20/2018   AST 23 03/20/2018   NA 138 03/26/2018   K 5.4 (H) 03/26/2018   CL 102 03/26/2018   CREATININE 1.27 (H) 03/26/2018   BUN 18 03/26/2018   CO2 28 03/26/2018   TSH 1.564 08/18/2012   INR 0.98 01/12/2017   Ecg today:NSR, normal Ecg. I have personally reviewed and interpreted this study.  Myoview 04/01/18: Study Highlights     Nuclear stress EF: 52%.  The left ventricular ejection fraction is mildly decreased (45-54%).  Blood pressure demonstrated a normal response to exercise.  There was no ST segment deviation noted during stress.  No T wave inversion was noted during stress.  This is a low risk study.   No reversible ischemia. LVEF 52% with normal wall motion. This is a low risk  study.     Assessment / Plan: 1. CAD - past NSTEMI with PCI of the LAD and ramus intermediate with DES. Myoview in July 2019 showed no ischemia. EF 52%.    2. HLD - on statin therapy. Labs followed by Dr. Brigitte Pulse.   3. CLL

## 2018-06-08 ENCOUNTER — Ambulatory Visit: Payer: Managed Care, Other (non HMO) | Admitting: Cardiology

## 2018-08-09 ENCOUNTER — Encounter (HOSPITAL_BASED_OUTPATIENT_CLINIC_OR_DEPARTMENT_OTHER): Payer: Self-pay | Admitting: *Deleted

## 2018-08-09 ENCOUNTER — Emergency Department (HOSPITAL_BASED_OUTPATIENT_CLINIC_OR_DEPARTMENT_OTHER)
Admission: EM | Admit: 2018-08-09 | Discharge: 2018-08-09 | Disposition: A | Discharge status: Home / Self Care | Payer: Managed Care, Other (non HMO) | Attending: Emergency Medicine | Admitting: Emergency Medicine

## 2018-08-09 ENCOUNTER — Other Ambulatory Visit: Payer: Self-pay

## 2018-08-09 ENCOUNTER — Emergency Department (HOSPITAL_BASED_OUTPATIENT_CLINIC_OR_DEPARTMENT_OTHER): Payer: Managed Care, Other (non HMO)

## 2018-08-09 DIAGNOSIS — Z955 Presence of coronary angioplasty implant and graft: Secondary | ICD-10-CM | POA: Insufficient documentation

## 2018-08-09 DIAGNOSIS — I2510 Atherosclerotic heart disease of native coronary artery without angina pectoris: Secondary | ICD-10-CM | POA: Insufficient documentation

## 2018-08-09 DIAGNOSIS — R1011 Right upper quadrant pain: Secondary | ICD-10-CM | POA: Diagnosis present

## 2018-08-09 DIAGNOSIS — J449 Chronic obstructive pulmonary disease, unspecified: Secondary | ICD-10-CM | POA: Insufficient documentation

## 2018-08-09 DIAGNOSIS — K8050 Calculus of bile duct without cholangitis or cholecystitis without obstruction: Secondary | ICD-10-CM | POA: Insufficient documentation

## 2018-08-09 DIAGNOSIS — R52 Pain, unspecified: Secondary | ICD-10-CM

## 2018-08-09 DIAGNOSIS — Z7982 Long term (current) use of aspirin: Secondary | ICD-10-CM | POA: Insufficient documentation

## 2018-08-09 DIAGNOSIS — Z79899 Other long term (current) drug therapy: Secondary | ICD-10-CM | POA: Insufficient documentation

## 2018-08-09 DIAGNOSIS — F909 Attention-deficit hyperactivity disorder, unspecified type: Secondary | ICD-10-CM | POA: Diagnosis not present

## 2018-08-09 DIAGNOSIS — I252 Old myocardial infarction: Secondary | ICD-10-CM | POA: Insufficient documentation

## 2018-08-09 DIAGNOSIS — Z856 Personal history of leukemia: Secondary | ICD-10-CM | POA: Diagnosis not present

## 2018-08-09 HISTORY — DX: Chronic lymphocytic leukemia of B-cell type not having achieved remission: C91.10

## 2018-08-09 LAB — COMPREHENSIVE METABOLIC PANEL
ALK PHOS: 43 U/L (ref 38–126)
ALT: 21 U/L (ref 0–44)
AST: 23 U/L (ref 15–41)
Albumin: 3.9 g/dL (ref 3.5–5.0)
Anion gap: 8 (ref 5–15)
BILIRUBIN TOTAL: 0.7 mg/dL (ref 0.3–1.2)
BUN: 15 mg/dL (ref 6–20)
CALCIUM: 8.6 mg/dL — AB (ref 8.9–10.3)
CHLORIDE: 103 mmol/L (ref 98–111)
CO2: 24 mmol/L (ref 22–32)
CREATININE: 0.97 mg/dL (ref 0.61–1.24)
Glucose, Bld: 97 mg/dL (ref 70–99)
Potassium: 3.7 mmol/L (ref 3.5–5.1)
Sodium: 135 mmol/L (ref 135–145)
Total Protein: 5.9 g/dL — ABNORMAL LOW (ref 6.5–8.1)

## 2018-08-09 LAB — CBC WITH DIFFERENTIAL/PLATELET
ABS IMMATURE GRANULOCYTES: 0.04 10*3/uL (ref 0.00–0.07)
Basophils Absolute: 0.1 10*3/uL (ref 0.0–0.1)
Basophils Relative: 1 %
Eosinophils Absolute: 0.3 10*3/uL (ref 0.0–0.5)
Eosinophils Relative: 3 %
HEMATOCRIT: 44.1 % (ref 39.0–52.0)
HEMOGLOBIN: 14.5 g/dL (ref 13.0–17.0)
IMMATURE GRANULOCYTES: 1 %
LYMPHS ABS: 2.3 10*3/uL (ref 0.7–4.0)
Lymphocytes Relative: 26 %
MCH: 33.1 pg (ref 26.0–34.0)
MCHC: 32.9 g/dL (ref 30.0–36.0)
MCV: 100.7 fL — AB (ref 80.0–100.0)
MONO ABS: 1.8 10*3/uL — AB (ref 0.1–1.0)
Monocytes Relative: 21 %
NEUTROS ABS: 4.3 10*3/uL (ref 1.7–7.7)
NEUTROS PCT: 48 %
PLATELETS: 181 10*3/uL (ref 150–400)
RBC: 4.38 MIL/uL (ref 4.22–5.81)
RDW: 13.6 % (ref 11.5–15.5)
WBC: 8.8 10*3/uL (ref 4.0–10.5)
nRBC: 0 % (ref 0.0–0.2)

## 2018-08-09 LAB — LIPASE, BLOOD: LIPASE: 28 U/L (ref 11–51)

## 2018-08-09 MED ORDER — LACTATED RINGERS IV BOLUS
1000.0000 mL | Freq: Once | INTRAVENOUS | Status: AC
  Administered 2018-08-09: 1000 mL via INTRAVENOUS

## 2018-08-09 NOTE — ED Notes (Signed)
PT states understanding of care given, follow up care. PT has no more questions at this time. PT  ambulated from ED to car with a steady gait.

## 2018-08-09 NOTE — ED Triage Notes (Signed)
Abdominal pain x 30 minutes. States he is being treated for cancer.

## 2018-08-09 NOTE — ED Notes (Signed)
Patient transported to Ultrasound 

## 2018-08-09 NOTE — ED Provider Notes (Signed)
[facility] Provider Note   CSN: [id number] Arrival date & time: 08/09/18  2008     History   Chief Complaint Chief Complaint  Patient presents with  . Abdominal Pain    HPI Melvin Martin is a 62 y.o. male.  The history is provided by the patient.  Abdominal Pain   This is a new problem. The current episode started 1 to 2 hours ago. The problem occurs constantly. The problem has been gradually improving. The pain is associated with eating. The pain is located in the RUQ and epigastric region. The quality of the pain is aching and sharp. The pain is moderate. Associated symptoms include anorexia and nausea. Pertinent negatives include fever and diarrhea. Nothing aggravates the symptoms. Nothing relieves the symptoms. Past workup does not include GI consult. His past medical history does not include PUD.    Past Medical History:  Diagnosis Date  . ADD (attention deficit disorder)   . Asthmatic bronchitis   . Cancer ([place])   . Chest pain    pt relates to GERDS and asthmatic bronchitis  . CLL (chronic lymphocytic leukemia) ([place])   . COPD (chronic obstructive pulmonary disease) ([place])   . Coronary artery disease   . GERD (gastroesophageal reflux disease)   . Hyperlipidemia LDL goal < 70   . Low testosterone   . NSTEMI (non-ST elevated myocardial infarction) [facility]) December 2013   with PCI with DES to the ramus and LAD    Patient Active Problem List   Diagnosis Date Noted  . Hyperkalemia 03/25/2018  . Chest pain 03/25/2018  . COPD (chronic obstructive pulmonary disease) ([place])   . CLL (chronic lymphocytic leukemia) ([place]) 01/06/2017  . Coronary artery disease   . Non-ST elevation myocardial infarction (NSTEMI), initial care episode ([place]) 08/18/2012  . GERD (gastroesophageal reflux disease)   . Dyslipidemia (high LDL; low HDL)     Past Surgical History:  Procedure Laterality Date  . CORONARY ANGIOPLASTY WITH STENT PLACEMENT  08/18/2012   DES  to the ramus and LAD  . Cyst removal, mouth    . LEFT HEART CATHETERIZATION WITH CORONARY ANGIOGRAM N/A 08/18/2012   Procedure: LEFT HEART CATHETERIZATION WITH CORONARY ANGIOGRAM;  Surgeon: Sherren Mocha, MD;  Location: [facility] CATH LAB;  Service: Cardiovascular;  Laterality: N/A;  . PERCUTANEOUS CORONARY STENT INTERVENTION (PCI-S)  08/18/2012   Procedure: PERCUTANEOUS CORONARY STENT INTERVENTION (PCI-S);  Surgeon: Sherren Mocha, MD;  Location: [facility] C[facility] LAB;  Service: Cardiovascular;;        Home Medications    Prior to Admission medications   Medication Sig Start Date End Date Taking? Authorizing Provider  acyclovir (ZOVIRAX) 400 MG tablet  08/08/18  Yes [provider]  allopurinol (ZYLOPRIM) 300 MG tablet Take by mouth. 07/15/18  Yes [provider]  Ascorbic Acid (VITAMIN C PO) Take 1-2 tablets by mouth daily.    Yes [provider]  aspirin 81 MG chewable tablet Chew by mouth. 08/19/12  Yes [provider]  azelastine (ASTELIN) 0.1 % nasal spray U 1 SPR IEN BID 08/08/18  Yes [provider]  Clobetasol Prop Emollient Base (CLOBETASOL PROPIONATE E EX) Apply 1 application topically as needed (itching).   Yes [provider]  diclofenac sodium (VOLTAREN) 1 % GEL Apply 2 g topically as needed (joint pain).   Yes [provider]  lamiVUDine-zidovudine (COMBIVIR) 150-300 MG tablet Take 1 tablet by mouth 2 (two) times daily.   Yes [provider]  MYRBETRIQ 50 MG  TB24 tablet Take 50 mg by mouth daily. 03/03/18  Yes [provider]  nitroGLYCERIN (NITROSTAT) 0.4 MG SL tablet Place 1 tablet (0.4 mg total) under the tongue every 5 (five) minutes as needed for chest pain. 09/17/15  Yes [name], [name], MD  nitroGLYCERIN (NITROSTAT) 0.4 MG SL tablet Place under the tongue. 09/17/15  Yes [provider]  Omega-3 Fatty Acids (SALMON OIL-1000 PO) Take 1,000 capsules by mouth 3 (three) times daily.   Yes [provider]  omeprazole (PRILOSEC) 40 MG capsule Take 40 mg by mouth daily.   Yes [provider]  rosuvastatin (CRESTOR) 10 MG tablet Take by mouth.   Yes [provider]  Skin Protectants, Misc. (EUCERIN) cream Apply 1 application topically as needed for dry skin.   Yes [provider]  Sodium Fluoride (PREVIDENT 5000 BOOSTER DT) Place 1 application onto teeth as needed (dental health).   Yes [provider]  STIOLTO RESPIMAT 2.5-2.5 MCG/ACT AERS Inhale 2 puffs into the lungs daily. 03/19/18  Yes [provider]  sulfamethoxazole-trimethoprim (BACTRIM DS,SEPTRA DS) 800-160 MG tablet TK 1 T PO Q MWF 08/08/18  Yes [provider]  tamsulosin (FLOMAX) 0.4 MG CAPS capsule Take 0.4 mg by mouth at bedtime. 01/21/18  Yes [provider]  Testosterone (TESTOPEL) 75 MG PLLT 1 each by Implant route as directed. Patient states that he receives this medication every 5 month for low testosterone 16 pellets last done at 1/30   Yes [provider]  VENETOCLAX PO Take by mouth.   Yes [provider]  aspirin EC 325 MG EC tablet Take 1 tablet (325 mg total) by mouth daily. 03/27/18   Osei-Bonsu, Iona Beard, MD  CRESTOR 20 MG tablet Take 10 mg by mouth daily.  07/17/14   [provider]  Ipratropium-Albuterol (COMBIVENT RESPIMAT) 20-100 MCG/ACT AERS respimat Inhale 1 puff into the lungs 4 (four) times daily as needed for wheezing. 10/27/17 10/27/18  [provider]  mirabegron ER (MYRBETRIQ) 50 MG TB24 tablet Take by mouth.    [provider]  triamcinolone (NASACORT) 55 MCG/ACT AERO nasal inhaler Place 1 spray into the nose daily.    [provider]    Family History Family History  Problem Relation Age of Onset  . Emphysema Mother   . Heart failure Father   . Heart attack Father   . Heart attack Cousin     Social History Social History   Tobacco Use  . Smoking status: Never Smoker  .  Smokeless tobacco: Never Used  Substance Use Topics  . Alcohol use: No  . Drug use: No     Allergies   Metoprolol   Review of Systems Review of Systems  Constitutional: Negative for fever.  Gastrointestinal: Positive for abdominal pain, anorexia and nausea. Negative for diarrhea.  All other systems reviewed and are negative.    Physical Exam Updated Vital Signs BP 125/78   Pulse 66   Temp 98 F (36.7 C)   Resp 18   Ht 5\' 10"  (1.778 m)   Wt 85 kg   SpO2 95%   BMI 26.89 kg/m   Physical Exam  Constitutional: He appears well-developed and well-nourished.  HENT:  Head: Normocephalic and atraumatic.  Eyes: Pupils are equal, round, and reactive to light. EOM are normal. No scleral icterus.  Neck: Normal range of motion.  Cardiovascular: Normal rate.  Pulmonary/Chest: Effort normal. No respiratory distress.  Abdominal: He exhibits no distension. There is tenderness in the  right upper quadrant and epigastric area. There is no rigidity, no guarding, no tenderness at McBurney's point and negative Murphy's sign.  Musculoskeletal: Normal range of motion.  Neurological: He is alert.  Nursing note and vitals reviewed.    ED Treatments / Results  Labs (all labs ordered are listed, but only abnormal results are displayed) Labs Reviewed  CBC WITH DIFFERENTIAL/PLATELET - Abnormal; Notable for the following components:      Result Value   MCV 100.7 (*)    Monocytes Absolute 1.8 (*)    All other components within normal limits  COMPREHENSIVE METABOLIC PANEL - Abnormal; Notable for the following components:   Calcium 8.6 (*)    Total Protein 5.9 (*)    All other components within normal limits  LIPASE, BLOOD    EKG None  Radiology US Abdomen Limited Ruq  Result Date: 08/09/2018 CLINICAL DATA:  62 year old male with acute RIGHT UPPER quadrant pain for several hours. EXAM: ULTRASOUND ABDOMEN LIMITED RIGHT UPPER QUADRANT COMPARISON:  01/07/2017 CT FINDINGS: Gallbladder:  Very small mobile gallstones are identified, the largest measuring 3 mm. Borderline gallbladder wall thickening noted without sonographic Murphy sign or pericholecystic fluid. Common bile duct: Diameter: 2 mm.  No intrahepatic or extrahepatic biliary dilatation. Liver: No focal lesion identified. Within normal limits in parenchymal echogenicity. Portal vein is patent on color Doppler imaging with normal direction of blood flow towards the liver. IMPRESSION: 1. Cholelithiasis with borderline gallbladder wall thickening, very equivocal for acute cholecystitis. No sonographic Murphy sign or pericholecystic fluid. No biliary dilatation. 2. Unremarkable liver. Electronically Signed   By: Margarette Canada M.D.   On: 08/09/2018 22:10    Procedures Procedures (including critical care time)  Medications Ordered in ED Medications  lactated ringers bolus 1,000 mL (0 mLs Intravenous Stopped 08/09/18 2239)     Initial Impression / Assessment and Plan / ED Course  I have reviewed the triage vital signs and the nursing notes.  Pertinent labs & imaging results that were available during my care of the patient were reviewed by me and considered in my medical decision making (see chart for details).     Cholelithiasis but no evidence of cholecystitis.  Pain improved while in the emergency room.  Tolerating p.o.  Suspect that he had biliary colic will likely need a elective cholecystectomy but no indication for emergent cholecystectomy tonight.  Will contact [place] with if continued symptoms will return here if any jaundice, fever, prolonged pain or severe pain.  Final Clinical Impressions(s) / ED Diagnoses   Final diagnoses:  Pain  Biliary colic    ED Discharge Orders    None       Chrishonda Hesch, Corene Cornea, MD 08/09/18 2334

## 2018-08-22 ENCOUNTER — Emergency Department (INDEPENDENT_AMBULATORY_CARE_PROVIDER_SITE_OTHER)
Admission: EM | Admit: 2018-08-22 | Discharge: 2018-08-22 | Disposition: A | Discharge status: Home / Self Care | Payer: Managed Care, Other (non HMO) | Source: Home / Self Care

## 2018-08-22 ENCOUNTER — Encounter: Payer: Self-pay | Admitting: Family Medicine

## 2018-08-22 ENCOUNTER — Other Ambulatory Visit: Payer: Self-pay

## 2018-08-22 ENCOUNTER — Emergency Department (INDEPENDENT_AMBULATORY_CARE_PROVIDER_SITE_OTHER): Payer: Managed Care, Other (non HMO)

## 2018-08-22 DIAGNOSIS — M79641 Pain in right hand: Secondary | ICD-10-CM

## 2018-08-22 DIAGNOSIS — M19041 Primary osteoarthritis, right hand: Secondary | ICD-10-CM | POA: Diagnosis not present

## 2018-08-22 NOTE — ED Triage Notes (Signed)
Pt c/o RT hand/ wrist pain x 3-4 wks.

## 2018-08-22 NOTE — Discharge Instructions (Addendum)
There is no fracture.  The injury is most consistent with a hand sprain with ligaments having been stretched.  This should heal over the next couple weeks using the splint you have selected.

## 2018-08-22 NOTE — ED Provider Notes (Signed)
[name]    CSN: [id number] Arrival date & time: 08/22/18  1627     History   Chief Complaint Chief Complaint  Patient presents with  . Hand Pain    HPI Melvin Martin is a 62 y.o. male.   This is the first [facility] urgent care visit for this 62 year old man in over 3 years.  He comes in seeking evaluation and treatment for right hand injury.  About 3 weeks ago, the patient swatted his dog and had immediate pain in the dorsal hand.  Since that time, he has been bothered by pain in the hand whenever he types or lifts something heavy.  Most of the swelling is gone down.    Patient was cautioned not to take Advil because of his chemotherapy.  Instead he has been using a splint and taking Tylenol.  Patient is right-handed.  He works at [employer].  Patient is being treated for CLL.  He believes he contracted CLL sometime before 2007.  His counts remain relatively stable in the low teens until 2017 at which point his counts started increasing and peaked out at around 133,000.  He is on an experimental cocktail of chemotherapy currently and is feeling very healthy.     Past Medical History:  Diagnosis Date  . ADD (attention deficit disorder)   . Asthmatic bronchitis   . Cancer ([place])   . Chest pain    pt relates to GERDS and asthmatic bronchitis  . CLL (chronic lymphocytic leukemia) ([place])   . COPD (chronic obstructive pulmonary disease) ([place])   . Coronary artery disease   . GERD (gastroesophageal reflux disease)   . Hyperlipidemia LDL goal < 70   . Low testosterone   . NSTEMI (non-ST elevated myocardial infarction) [facility]) December 2013   with PCI with DES to the ramus and LAD    Patient Active Problem List   Diagnosis Date Noted  . Hyperkalemia 03/25/2018  . Chest pain 03/25/2018  . COPD (chronic obstructive pulmonary disease) ([place])   . CLL (chronic lymphocytic leukemia) ([place]) 01/06/2017  . Coronary artery disease   . Non-ST elevation myocardial  infarction (NSTEMI), initial care episode ([place]) 08/18/2012  . GERD (gastroesophageal reflux disease)   . Dyslipidemia (high LDL; low HDL)     Past Surgical History:  Procedure Laterality Date  . CORONARY ANGIOPLASTY WITH STENT PLACEMENT  08/18/2012   DES to the ramus and LAD  . Cyst removal, mouth    . LEFT HEART CATHETERIZATION WITH CORONARY ANGIOGRAM N/A 08/18/2012   Procedure: LEFT HEART CATHETERIZATION WITH CORONARY ANGIOGRAM;  Surgeon: Sherren Mocha, MD;  Location: [facility] CATH LAB;  Service: Cardiovascular;  Laterality: N/A;  . PERCUTANEOUS CORONARY STENT INTERVENTION (PCI-S)  08/18/2012   Procedure: PERCUTANEOUS CORONARY STENT INTERVENTION (PCI-S);  Surgeon: Sherren Mocha, MD;  Location: [facility] CATH LAB;  Service: Cardiovascular;;       Home Medications    Prior to Admission medications   Medication Sig Start Date End Date Taking? Authorizing Provider  acyclovir (ZOVIRAX) 400 MG tablet  08/08/18   [provider]  allopurinol (ZYLOPRIM) 300 MG tablet Take by mouth. 07/15/18   [provider]  Ascorbic Acid (VITAMIN C PO) Take 1-2 tablets by mouth daily.     [provider]  aspirin 81 MG chewable tablet Chew by mouth. 08/19/12   [provider]  aspirin EC 325 MG EC tablet Take 1 tablet (325 mg total) by mouth daily. 03/27/18   Benito Mccreedy, MD  azelastine (  ASTELIN) 0.1 % nasal spray U 1 SPR IEN BID 08/08/18   [provider]  Clobetasol Prop Emollient Base (CLOBETASOL PROPIONATE E EX) Apply 1 application topically as needed (itching).    [provider]  CRESTOR 20 MG tablet Take 10 mg by mouth daily.  07/17/14   [provider]  diclofenac sodium (VOLTAREN) 1 % GEL Apply 2 g topically as needed (joint pain).    [provider]  Ipratropium-Albuterol (COMBIVENT RESPIMAT) 20-100 MCG/ACT AERS respimat Inhale 1 puff into the lungs 4 (four) times daily as needed for wheezing. 10/27/17 10/27/18  [provider]  lamiVUDine-zidovudine (COMBIVIR) 150-300 MG tablet Take 1 tablet by mouth 2 (two) times daily.    [provider]  mirabegron ER (MYRBETRIQ) 50 MG TB24 tablet Take by mouth.    [provider]  MYRBETRIQ 50 MG TB24 tablet Take 50 mg by mouth daily. 03/03/18   [provider]  nitroGLYCERIN (NITROSTAT) 0.4 MG SL tablet Place 1 tablet (0.4 mg total) under the tongue every 5 (five) minutes as needed for chest pain. 09/17/15   [name], [name], MD  nitroGLYCERIN (NITROSTAT) 0.4 MG SL tablet Place under the tongue. 09/17/15   [provider]  Omega-3 Fatty Acids (SALMON OIL-1000 PO) Take 1,000 capsules by mouth 3 (three) times daily.    [provider]  omeprazole (PRILOSEC) 40 MG capsule Take 40 mg by mouth daily.    [provider]  rosuvastatin (CRESTOR) 10 MG tablet Take by mouth.    [provider]  Skin Protectants, Misc. (EUCERIN) cream Apply 1 application topically as needed for dry skin.    [provider]  Sodium Fluoride (PREVIDENT 5000 BOOSTER DT) Place 1 application onto teeth as needed (dental health).    [provider]  STIOLTO RESPIMAT 2.5-2.5 MCG/ACT AERS Inhale 2 puffs into the lungs daily. 03/19/18   [provider]  tamsulosin (FLOMAX) 0.4 MG CAPS capsule Take 0.4 mg by mouth at bedtime. 01/21/18   [provider]  Testosterone (TESTOPEL) 75 MG PLLT 1 each by Implant route as directed. Patient states that he receives this medication every 5 month for low testosterone 16 pellets last done at 1/30    [provider]  triamcinolone (NASACORT) 55 MCG/ACT AERO nasal inhaler Place 1 spray into the nose daily.    [provider]  VENETOCLAX PO Take by mouth.    [provider]    Family History Family History  Problem Relation Age of Onset  . Emphysema Mother   . Heart failure Father   . Heart attack Father   . Heart attack Cousin     Social History Social  History   Tobacco Use  . Smoking status: Never Smoker  . Smokeless tobacco: Never Used  Substance Use Topics  . Alcohol use: No  . Drug use: No     Allergies   Metoprolol   Review of Systems Review of Systems  Allergic/Immunologic: Positive for immunocompromised state.  All other systems reviewed and are negative.    Physical Exam Triage Vital Signs ED Triage Vitals  Enc Vitals Group     BP      Pulse      Resp      Temp      Temp src      SpO2      Weight      Height      Head Circumference      Peak Flow  Pain Score      Pain Loc      Pain Edu?      Excl. in [place]?    No data found.  Updated Vital Signs BP 122/77 (BP Location: Right Arm)   Pulse 72   Temp 97.9 F (36.6 C) (Oral)   Resp 16   Ht 5\' 10"  (1.778 m)   Wt 83 kg   SpO2 93%   BMI 26.26 kg/m    Physical Exam Vitals signs and nursing note reviewed.  Constitutional:      Appearance: Normal appearance. He is normal weight.  HENT:     Head: Normocephalic.     Right Ear: External ear normal.     Left Ear: External ear normal.     Mouth/Throat:     Pharynx: Oropharynx is clear.  Eyes:     Conjunctiva/sclera: Conjunctivae normal.  Neck:     Musculoskeletal: Normal range of motion and neck supple.  Pulmonary:     Effort: Pulmonary effort is normal.  Musculoskeletal: Normal range of motion.        General: Signs of injury present. No swelling, tenderness or deformity.     Comments: Right dorsal hand reported to be swollen but does not appear injured.  FROM.  Skin:    General: Skin is warm and dry.  Neurological:     General: No focal deficit present.     Mental Status: He is alert and oriented to person, place, and time.  Psychiatric:        Mood and Affect: Mood normal.      UC Treatments / Results  Labs (all labs ordered are listed, but only abnormal results are displayed) Labs Reviewed - No data to display  EKG None  Radiology Dg Hand Complete Right  Result Date:  08/22/2018 CLINICAL DATA:  Dorsal hand pain and swelling after sweating at a dog 3 weeks ago. EXAM: RIGHT HAND - COMPLETE 3+ VIEW COMPARISON:  None. FINDINGS: There is no evidence of fracture or dislocation. Carpal rows are maintained. There are degenerative subcortical cystic lucencies of the capitate, lunate and hamate. Small phlebolith is seen the dorsum of the wrist. Slight dorsal soft tissue swelling is identified. No acute fracture or suspicious osseous lesions. Mild degenerative joint space narrowing of the first through third MCP joints. IMPRESSION: Degenerative joint space narrowing the first through third MCP. Likewise there appear to be scattered subcortical cysts or erosions of the carpal bones as above. Slight dorsal soft tissue swelling over the wrist. No acute osseous appearing abnormality. Electronically Signed   By: Ashley Royalty M.D.   On: 08/22/2018 17:40    Procedures Procedures (including critical care time)  Medications Ordered in UC Medications - No data to display  Initial Impression / Assessment and Plan / UC Course  I have reviewed the triage vital signs and the nursing notes.  Pertinent labs & imaging results that were available during my care of the patient were reviewed by me and considered in my medical decision making (see chart for details).     Final Clinical Impressions(s) / UC Diagnoses   Final diagnoses:  Right hand pain     Discharge Instructions     There is no fracture.  The injury is most consistent with a hand sprain with ligaments having been stretched.  This should heal over the next couple weeks using the splint you have selected.    ED Prescriptions    None     Controlled Substance  Prescriptions [place] Controlled Substance Registry consulted? Not Applicable   Robyn Haber, MD 08/22/18 1753

## 2018-08-25 NOTE — Progress Notes (Deleted)
Melvin Martin Date of Birth: 1956-05-25 Medical Record #[id number]  History of Present Illness: Melvin Martin is seen for follow up CAD.  He has known CAD with NSTEMI in December of 2013 with PCI that included DES to the ramus and LAD, HLD,CLL and GERD. Last Myoview in January 2016 showed normal perfusion and EF 49%. 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 He was seen in the hospital in July with atypical chest pain. He ruled out by Ecg and enzymes. He had repeat Myoview study done as outpatient showing normal perfusion and EF 52%.   Earlier this year he developed worsening symptoms of CLL and anemia. He was started on investigational therapy with ibrutinib and obinutuzumab at [facility]. Subsequent CT scans showed marked improvement in lymphadenopathy. Anemia improved and WBC declined.    He reports that over the past week he has experienced increased chest discomfort. This is described as tightness in the lower chest and epigastrium. It comes and goes. It is worse as the day progresses. It is different than the pain he had with his MI. He admits to being under increased stress at home. On one occasion he noted he got winded and fatigued with exertion.    Current Outpatient Medications  Medication Sig Dispense Refill  . acyclovir (ZOVIRAX) 400 MG tablet   10  . allopurinol (ZYLOPRIM) 300 MG tablet Take by mouth.    . Ascorbic Acid (VITAMIN C PO) Take 1-2 tablets by mouth daily.     Marland Kitchen aspirin 81 MG chewable tablet Chew by mouth.    Marland Kitchen aspirin EC 325 MG EC tablet Take 1 tablet (325 mg total) by mouth daily. 30 tablet 0  . azelastine (ASTELIN) 0.1 % nasal spray U 1 SPR IEN BID  11  . Clobetasol Prop Emollient Base (CLOBETASOL PROPIONATE E EX) Apply 1 application topically as needed (itching).    . CRESTOR 20 MG tablet Take 10 mg by mouth daily.   5  . diclofenac sodium (VOLTAREN) 1 % GEL Apply 2 g topically as needed (joint pain).    . Ipratropium-Albuterol (COMBIVENT RESPIMAT) 20-100 MCG/ACT AERS  respimat Inhale 1 puff into the lungs 4 (four) times daily as needed for wheezing.    Marland Kitchen lamiVUDine-zidovudine (COMBIVIR) 150-300 MG tablet Take 1 tablet by mouth 2 (two) times daily.    . mirabegron ER (MYRBETRIQ) 50 MG TB24 tablet Take by mouth.    Marland Kitchen MYRBETRIQ 50 MG TB24 tablet Take 50 mg by mouth daily.  3  . nitroGLYCERIN (NITROSTAT) 0.4 MG SL tablet Place 1 tablet (0.4 mg total) under the tongue every 5 (five) minutes as needed for chest pain. 25 tablet 0  . nitroGLYCERIN (NITROSTAT) 0.4 MG SL tablet Place under the tongue.    . Omega-3 Fatty Acids (SALMON OIL-1000 PO) Take 1,000 capsules by mouth 3 (three) times daily.    Marland Kitchen omeprazole (PRILOSEC) 40 MG capsule Take 40 mg by mouth daily.    . rosuvastatin (CRESTOR) 10 MG tablet Take by mouth.    . Skin Protectants, Misc. (EUCERIN) cream Apply 1 application topically as needed for dry skin.    . Sodium Fluoride (PREVIDENT 5000 BOOSTER DT) Place 1 application onto teeth as needed (dental health).    . STIOLTO RESPIMAT 2.5-2.5 MCG/ACT AERS Inhale 2 puffs into the lungs daily.    . tamsulosin (FLOMAX) 0.4 MG CAPS capsule Take 0.4 mg by mouth at bedtime.    . Testosterone (TESTOPEL) 75 MG PLLT 1 each by Implant route as  directed. Patient states that he receives this medication every 5 month for low testosterone 16 pellets last done at 1/30    . triamcinolone (NASACORT) 55 MCG/ACT AERO nasal inhaler Place 1 spray into the nose daily.    . VENETOCLAX PO Take by mouth.     No current facility-administered medications for this visit.     Allergies  Allergen Reactions  . Metoprolol Shortness Of Breath    Caused BP to increase     Past Medical History:  Diagnosis Date  . ADD (attention deficit disorder)   . Asthmatic bronchitis   . Cancer ([place])   . Chest pain    pt relates to GERDS and asthmatic bronchitis  . CLL (chronic lymphocytic leukemia) ([place])   . COPD (chronic obstructive pulmonary disease) ([place])   . Coronary artery disease   .  GERD (gastroesophageal reflux disease)   . Hyperlipidemia LDL goal < 70   . Low testosterone   . NSTEMI (non-ST elevated myocardial infarction) [facility]) December 2013   with PCI with DES to the ramus and LAD    Past Surgical History:  Procedure Laterality Date  . CORONARY ANGIOPLASTY WITH STENT PLACEMENT  08/18/2012   DES to the ramus and LAD  . Cyst removal, mouth    . LEFT HEART CATHETERIZATION WITH CORONARY ANGIOGRAM N/A 08/18/2012   Procedure: LEFT HEART CATHETERIZATION WITH CORONARY ANGIOGRAM;  Surgeon: Sherren Mocha, MD;  Location: [facility] CATH LAB;  Service: Cardiovascular;  Laterality: N/A;  . PERCUTANEOUS CORONARY STENT INTERVENTION (PCI-S)  08/18/2012   Procedure: PERCUTANEOUS CORONARY STENT INTERVENTION (PCI-S);  Surgeon: Sherren Mocha, MD;  Location: [facility] CATH LAB;  Service: Cardiovascular;;    Social History   Tobacco Use  Smoking Status Never Smoker  Smokeless Tobacco Never Used    Social History   Substance and Sexual Activity  Alcohol Use No    Family History  Problem Relation Age of Onset  . Emphysema Mother   . Heart failure Father   . Heart attack Father   . Heart attack Cousin     Review of Systems: The review of systems is per the HPI.  All other systems were reviewed and are negative.  Physical Exam: There were no vitals taken for this visit. Patient is very pleasant and in no acute distress. Skin is warm and dry. Color is normal.  HEENT is unremarkable. Normocephalic/atraumatic. PERRL. Sclera are nonicteric. Neck is supple. No masses. No JVD. Lungs are clear. Cardiac exam shows a regular rate and rhythm. Normal S1-2. No gallop or murmur. No chest wall tenderness to palpation. Abdomen is soft. Extremities are without edema. Gait and ROM are intact. No gross neurologic deficits noted.  LABORATORY DATA: Lab Results  Component Value Date   WBC 8.8 08/09/2018   HGB 14.5 08/09/2018   HCT 44.1 08/09/2018   PLT 181 08/09/2018   GLUCOSE 97 08/09/2018   CHOL  66 03/25/2018   TRIG 109 03/25/2018   HDL 12 (L) 03/25/2018   LDLCALC 32 03/25/2018   ALT 21 08/09/2018   AST 23 08/09/2018   NA 135 08/09/2018   K 3.7 08/09/2018   CL 103 08/09/2018   CREATININE 0.97 08/09/2018   BUN 15 08/09/2018   CO2 24 08/09/2018   TSH 1.564 08/18/2012   INR 0.98 01/12/2017   Ecg today:NSR, normal Ecg. I have personally reviewed and interpreted this study.   Assessment / Plan: 1. CAD - past NSTEMI with PCI of the LAD and ramus intermediate with DES in  2013. Normal Myoview in July 2019.    2. HLD - on statin therapy. Labs followed by Dr. Brigitte Pulse.   3. CLL

## 2018-09-05 ENCOUNTER — Ambulatory Visit: Payer: Managed Care, Other (non HMO) | Admitting: Cardiology

## 2018-10-11 NOTE — Progress Notes (Signed)
[name] Date of Birth: 01/18/1956 Medical Record #[id number]  History of Present Illness: Dallis is seen at the request of Dr Brigitte Pulse for evaluation of CAD. He was last seen by me in 2016.   He has known CAD with NSTEMI in December of 2013 with PCI that included DES to the ramus and LAD, HLD and GERD. 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 He had a Myoview study in January 2016 that showed normal perfusion and EF 49%.   He was seen by our service in the hospital in July 2019 with atypical chest pain. States the only time he gets chest pain is when arguing with his ex wife.  Ecg was normal. Troponin levels were normal. Repeat Myoview as an outpatient was normal.  He does have CLL and started on chemotherapy in September 2019 for progressive lymphadenopathy and leucocytosis at [facility]. Echo there in August 2019 showed a normal study.  On follow up today he is feeling great. Energy level is good. He has been diagnosed with COPD. Works out on elliptical 5 days a week for 45 minutes. No stress at work now. No chest pain. Dyspnea improved since he has had chemotherapy. No edema or claudication.     Current Outpatient Medications  Medication Sig Dispense Refill  . acyclovir (ZOVIRAX) 400 MG tablet   10  . aspirin 81 MG chewable tablet Chew by mouth.    Marland Kitchen azelastine (ASTELIN) 0.1 % nasal spray U 1 SPR IEN BID  11  . Clobetasol Prop Emollient Base (CLOBETASOL PROPIONATE E EX) Apply 1 application topically as needed (itching).    . CRESTOR 20 MG tablet Take 10 mg by mouth daily.   5  . Ipratropium-Albuterol (COMBIVENT RESPIMAT) 20-100 MCG/ACT AERS respimat Inhale 1 puff into the lungs 4 (four) times daily as needed for wheezing.    Marland Kitchen lamiVUDine-zidovudine (COMBIVIR) 150-300 MG tablet Take 1 tablet by mouth 2 (two) times daily.    . nitroGLYCERIN (NITROSTAT) 0.4 MG SL tablet Place 1 tablet (0.4 mg total) under the tongue every 5 (five) minutes as needed for chest pain. 25  tablet 0  . omeprazole (PRILOSEC) 40 MG capsule Take 40 mg by mouth daily.    . rosuvastatin (CRESTOR) 10 MG tablet Take by mouth.    . Skin Protectants, Misc. (EUCERIN) cream Apply 1 application topically as needed for dry skin.    . Sodium Fluoride (PREVIDENT 5000 BOOSTER DT) Place 1 application onto teeth as needed (dental health).    . STIOLTO RESPIMAT 2.5-2.5 MCG/ACT AERS Inhale 2 puffs into the lungs daily.    Marland Kitchen sulfamethoxazole-trimethoprim (BACTRIM DS,SEPTRA DS) 800-160 MG tablet Take 1 tablet by mouth 3 (three) times a week.    . Testosterone (TESTOPEL) 75 MG PLLT 1 each by Implant route as directed. Patient states that he receives this medication every 5 month for low testosterone 16 pellets last done at 1/30    . VENETOCLAX PO Take by mouth.    . IBRUTINIB PO Take 140 mg by mouth 3 (three) times daily.      No current facility-administered medications for this visit.     Allergies  Allergen Reactions  . Metoprolol Shortness Of Breath    Caused BP to increase     Past Medical History:  Diagnosis Date  . ADD (attention deficit disorder)   . Asthmatic bronchitis   . Cancer ([place])   . Chest pain    pt relates to GERDS  and asthmatic bronchitis  . CLL (chronic lymphocytic leukemia) ([place])   . COPD (chronic obstructive pulmonary disease) ([place])   . Coronary artery disease   . GERD (gastroesophageal reflux disease)   . Hyperlipidemia LDL goal < 70   . Low testosterone   . NSTEMI (non-ST elevated myocardial infarction) [facility]) December 2013   with PCI with DES to the ramus and LAD    Past Surgical History:  Procedure Laterality Date  . CORONARY ANGIOPLASTY WITH STENT PLACEMENT  08/18/2012   DES to the ramus and LAD  . Cyst removal, mouth    . LEFT HEART CATHETERIZATION WITH CORONARY ANGIOGRAM N/A 08/18/2012   Procedure: LEFT HEART CATHETERIZATION WITH CORONARY ANGIOGRAM;  Surgeon: Sherren Mocha, MD;  Location: [facility] CATH LAB;  Service: Cardiovascular;  Laterality: N/A;  .  PERCUTANEOUS CORONARY STENT INTERVENTION (PCI-S)  08/18/2012   Procedure: PERCUTANEOUS CORONARY STENT INTERVENTION (PCI-S);  Surgeon: Sherren Mocha, MD;  Location: [facility] CATH LAB;  Service: Cardiovascular;;    Social History   Tobacco Use  Smoking Status Never Smoker  Smokeless Tobacco Never Used    Social History   Substance and Sexual Activity  Alcohol Use No    Family History  Problem Relation Age of Onset  . Emphysema Mother   . Heart failure Father   . Heart attack Father   . Heart attack Cousin     Review of Systems: The review of systems is per the HPI.  All other systems were reviewed and are negative.  Physical Exam: BP 108/68   Pulse 76   Ht 5\' 10"  (1.778 m)   Wt 174 lb (78.9 kg)   SpO2 98%   BMI 24.97 kg/m  GENERAL:  Well appearing WM in NAD HEENT:  PERRL, EOMI, sclera are clear. Oropharynx is clear. NECK:  No jugular venous distention, carotid upstroke brisk and symmetric, no bruits, no thyromegaly or adenopathy LUNGS:  Clear to auscultation bilaterally CHEST:  Unremarkable HEART:  RRR,  PMI not displaced or sustained,S1 and S2 within normal limits, no S3, no S4: no clicks, no rubs, no murmurs ABD:  Soft, nontender. BS +, no masses or bruits. No hepatomegaly, no splenomegaly EXT:  2 + pulses throughout, no edema, no cyanosis no clubbing SKIN:  Warm and dry.  No rashes NEURO:  Alert and oriented x 3. Cranial nerves II through XII intact. PSYCH:  Cognitively intact    LABORATORY DATA: Lab Results  Component Value Date   WBC 8.8 08/09/2018   HGB 14.5 08/09/2018   HCT 44.1 08/09/2018   PLT 181 08/09/2018   GLUCOSE 97 08/09/2018   CHOL 66 03/25/2018   TRIG 109 03/25/2018   HDL 12 (L) 03/25/2018   LDLCALC 32 03/25/2018   ALT 21 08/09/2018   AST 23 08/09/2018   NA 135 08/09/2018   K 3.7 08/09/2018   CL 103 08/09/2018   CREATININE 0.97 08/09/2018   BUN 15 08/09/2018   CO2 24 08/09/2018   TSH 1.564 08/18/2012   INR 0.98 01/12/2017   Ecg  today:NSR, normal Ecg. Rate 76. I have personally reviewed and interpreted this study.  Myoview 04/01/18: Study Highlights     Nuclear stress EF: 52%.  The left ventricular ejection fraction is mildly decreased (45-54%).  Blood pressure demonstrated a normal response to exercise.  There was no ST segment deviation noted during stress.  No T wave inversion was noted during stress.  This is a low risk study.   No reversible ischemia. LVEF 52% with normal  wall motion. This is a low risk study   Echo 05/05/18:  NORMAL LEFT VENTRICULAR SYSTOLIC FUNCTION  NORMAL LA PRESSURES WITH NORMAL DIASTOLIC FUNCTION  NORMAL RIGHT VENTRICULAR SYSTOLIC FUNCTION  VALVULAR REGURGITATION: TRIVIAL MR, TRIVIAL TR  NO VALVULAR STENOSIS  INSUFFICIENT TR TO ASSES RVSP.   Assessment / Plan:  1. CAD - past NSTEMI with PCI of the LAD and ramus intermediate with DES in 2013.  Normal Myoview study in July 2019 and normal Echo in August 2019. He is asymptomatic. Continue ASA and statin. Continue exercise program.   2. HLD - on statin therapy. Last LDL excellent. Repeating labs in March with Dr. Brigitte Pulse.  3. CLL. Now on chemotherapy with improvement  4. COPD. No history of smoking. Followed by pulmonary.

## 2018-10-17 ENCOUNTER — Encounter: Payer: Self-pay | Admitting: Cardiology

## 2018-10-17 ENCOUNTER — Ambulatory Visit: Payer: Managed Care, Other (non HMO) | Admitting: Cardiology

## 2018-10-17 VITALS — BP 108/68 | HR 76 | Ht 70.0 in | Wt 174.0 lb

## 2018-10-17 DIAGNOSIS — I2510 Atherosclerotic heart disease of native coronary artery without angina pectoris: Secondary | ICD-10-CM | POA: Diagnosis not present

## 2018-10-17 DIAGNOSIS — E7800 Pure hypercholesterolemia, unspecified: Secondary | ICD-10-CM | POA: Diagnosis not present

## 2018-11-14 ENCOUNTER — Other Ambulatory Visit: Payer: Self-pay

## 2018-11-14 ENCOUNTER — Other Ambulatory Visit: Payer: Self-pay | Admitting: *Deleted

## 2018-11-14 MED ORDER — NITROGLYCERIN 0.4 MG SL SUBL: 0.4000 mg | SUBLINGUAL_TABLET | SUBLINGUAL | 12 refills | Status: DC | PRN

## 2019-02-23 ENCOUNTER — Ambulatory Visit (INDEPENDENT_AMBULATORY_CARE_PROVIDER_SITE_OTHER): Payer: 59 | Admitting: Psychiatry

## 2019-02-23 ENCOUNTER — Other Ambulatory Visit: Payer: Self-pay

## 2019-02-23 ENCOUNTER — Encounter: Payer: Self-pay | Admitting: Psychiatry

## 2019-02-23 DIAGNOSIS — F411 Generalized anxiety disorder: Secondary | ICD-10-CM | POA: Diagnosis not present

## 2019-02-23 NOTE — Progress Notes (Signed)
[facility] Counsel[facility] Initial Adult Exam  Name: Melvin Martin Date: 02/23/2019 MRN: [id number] DOB: [birth year] PCP: Marton Redwood, MD  Time spent: 5 minutes    Paperwork requested:  No   Reason for Visit /Presenting Problem: This is a 63 year old white divorced male who is currently on furlough from his job with [employer].  The client states that he needs help managing his anxiety over all the different events that have taken place especially over the last few years.  The client has recently divorced after 96 years of marriage.  He has had a heart attack.  He has been diagnosed and treated for chronic lymphocytic leukemia.  He also has been diagnosed and is in treatment for COPD from an unknown origin.  The client is in a experimental protocol for the CLL.  He has been to the emergency room with what he thought was a heart attack but most likely severe generalized anxiety.  This past December he was diagnosed with gallstones.  He has very low energy which he treats with cardiovascular exercise.  He is also just recently started testosterone supplementation. He is disappointed in how poorly his marriage went.  The client wants to heal the hurts within that caused him to choose the woman he did in marriage.  He also wants to come to terms with having serious health problems at age 48 that Yury Schaus significantly shorten his life span.  He wants to figure out how to live life as a single adult without making the poor choices he has in the past.  Mental Status Exam:   Appearance:   Casual     Behavior:  Appropriate  Motor:  Normal  Speech/Language:   Clear and Coherent  Affect:  Appropriate  Mood:  anxious  Thought process:  normal  Thought content:    WNL  Sensory/Perceptual disturbances:    WNL  Orientation:  oriented to person, place, time/date and situation  Attention:  Good  Concentration:  Good  Memory:  WNL  Fund of knowledge:   Good  Insight:    Good  Judgment:   Good  Impulse Control:   Good   Reported Symptoms:  Anxiety.  Risk Assessment: Danger to Self:  No Self-injurious Behavior: No Danger to Others: No Duty to Warn:no Physical Aggression / Violence:No  Access to Firearms a concern: No  Gang Involvement:No  Patient / guardian was educated about steps to take if suicide or homicide risk level increases between visits: yes While future psychiatric events cannot be accurately predicted, the patient does not currently require acute inpatient psychiatric care and does not currently meet [facility] involuntary commitment criteria.  Substance Abuse History: Current substance abuse: No     Past Psychiatric History:   Previous psychological history is significant for anxiety Outpatient Providers:Fred Shakirra Buehler [facility] History of Psych Hospitalization: No   Abuse History: Victim of No., none   Report needed: No. Victim of Neglect:No. Perpetrator of not applicable.  Witness / Exposure to Domestic Violence: No   Protective Services Involvement: No  Witness to [employer] Violence:  No   Family History:  Family History  Problem Relation Age of Onset  . Emphysema Mother   . Heart failure Father   . Heart attack Father   . Heart attack Cousin     Living situation: the patient lives with their daughter  Sexual Orientation:  Straight  Relationship Status: divorced              If a parent, number  of children 2 / ages:18, 15  Financial Stress:  No   Income/Employment/Disability: Unemployment  Armed forces logistics/support/administrative officer: No   Educational History: Education: Scientist, product/process development:   Pro[employer]  Any cultural differences that Elma Limas affect / interfere with treatment:  not applicable   Recreation/Hobbies: reading  Stressors:Health problems  Strengths:  Supportive Relationships, Friends, Church, Spirituality and Self Advocate  Barriers:  none   Legal History: Pending legal issue / charges: The patient has no significant history of legal  issues. History of legal issue / charges: none  Medical History/Surgical History:not reviewed Past Medical History:  Diagnosis Date  . ADD (attention deficit disorder)   . Asthmatic bronchitis   . Cancer ([place])   . Chest pain    pt relates to GERDS and asthmatic bronchitis  . CLL (chronic lymphocytic leukemia) ([place])   . COPD (chronic obstructive pulmonary disease) ([place])   . Coronary artery disease   . GERD (gastroesophageal reflux disease)   . Hyperlipidemia LDL goal < 70   . Low testosterone   . NSTEMI (non-ST elevated myocardial infarction) [facility]) December 2013   with PCI with DES to the ramus and LAD    Past Surgical History:  Procedure Laterality Date  . CORONARY ANGIOPLASTY WITH STENT PLACEMENT  08/18/2012   DES to the ramus and LAD  . Cyst removal, mouth    . LEFT HEART CATHETERIZATION WITH CORONARY ANGIOGRAM N/A 08/18/2012   Procedure: LEFT HEART CATHETERIZATION WITH CORONARY ANGIOGRAM;  Surgeon: Sherren Mocha, MD;  Location: [facility] CATH LAB;  Service: Cardiovascular;  Laterality: N/A;  . PERCUTANEOUS CORONARY STENT INTERVENTION (PCI-S)  08/18/2012   Procedure: PERCUTANEOUS CORONARY STENT INTERVENTION (PCI-S);  Surgeon: Sherren Mocha, MD;  Location: [facility] CATH LAB;  Service: Cardiovascular;;    Medications: Current Outpatient Medications  Medication Sig Dispense Refill  . acyclovir (ZOVIRAX) 400 MG tablet   10  . aspirin 81 MG chewable tablet Chew by mouth.    Marland Kitchen azelastine (ASTELIN) 0.1 % nasal spray U 1 SPR IEN BID  11  . Clobetasol Prop Emollient Base (CLOBETASOL PROPIONATE E EX) Apply 1 application topically as needed (itching).    . CRESTOR 20 MG tablet Take 10 mg by mouth daily.   5  . IBRUTINIB PO Take 140 mg by mouth 3 (three) times daily.     . Ipratropium-Albuterol (COMBIVENT RESPIMAT) 20-100 MCG/ACT AERS respimat Inhale 1 puff into the lungs 4 (four) times daily as needed for wheezing.    Marland Kitchen lamiVUDine-zidovudine (COMBIVIR) 150-300 MG tablet Take 1 tablet by mouth 2  (two) times daily.    . nitroGLYCERIN (NITROSTAT) 0.4 MG SL tablet Place 1 tablet (0.4 mg total) under the tongue every 5 (five) minutes as needed for chest pain. 25 tablet 12  . omeprazole (PRILOSEC) 40 MG capsule Take 40 mg by mouth daily.    . rosuvastatin (CRESTOR) 10 MG tablet Take by mouth.    . Skin Protectants, Misc. (EUCERIN) cream Apply 1 application topically as needed for dry skin.    . Sodium Fluoride (PREVIDENT 5000 BOOSTER DT) Place 1 application onto teeth as needed (dental health).    . STIOLTO RESPIMAT 2.5-2.5 MCG/ACT AERS Inhale 2 puffs into the lungs daily.    Marland Kitchen sulfamethoxazole-trimethoprim (BACTRIM DS,SEPTRA DS) 800-160 MG tablet Take 1 tablet by mouth 3 (three) times a week.    . Testosterone (TESTOPEL) 75 MG PLLT 1 each by Implant route as directed. Patient states that he receives this medication every 5 month for low testosterone 16  pellets last done at 1/30    . VENETOCLAX PO Take by mouth.     No current facility-administered medications for this visit.     Allergies  Allergen Reactions  . Metoprolol Shortness Of Breath    Caused BP to increase     Diagnoses:    ICD-10-CM   1. Generalized anxiety disorder  F41.1     Plan of Care: Develop treatment plan, focus on resolving clients concerns and increasing his ability with discernment in relationships.  This record has been created using [employer].  Chart creation errors have been sought, but Diangelo Radel not always have been located and corrected. Such creation errors do not reflect on the standard of medical care   Aianna Fahs, [facility]

## 2019-03-07 ENCOUNTER — Ambulatory Visit: Payer: 59 | Admitting: Psychiatry

## 2019-03-07 ENCOUNTER — Other Ambulatory Visit: Payer: Self-pay

## 2019-03-07 ENCOUNTER — Encounter: Payer: Self-pay | Admitting: Psychiatry

## 2019-03-07 DIAGNOSIS — F411 Generalized anxiety disorder: Secondary | ICD-10-CM | POA: Diagnosis not present

## 2019-03-07 NOTE — Progress Notes (Signed)
      [facility] Counselor/Therapist Progress Note  Patient ID: Melvin Martin, MRN: [id number],    Date: 03/07/2019  Time Spent: 50 minutes   Treatment Type: Individual Therapy  Reported Symptoms: anxious  Mental Status Exam:  Appearance:   Casual and Well Groomed     Behavior:  Appropriate  Motor:  Normal  Speech/Language:   Clear and Coherent  Affect:  Appropriate  Mood:  anxious  Thought process:  normal  Thought content:    WNL  Sensory/Perceptual disturbances:    WNL  Orientation:  oriented to person, place, time/date and situation  Attention:  Good  Concentration:  Good  Memory:  WNL  Fund of knowledge:   Good  Insight:    Good  Judgment:   Good  Impulse Control:  Good   Risk Assessment: Danger to Self:  No Self-injurious Behavior: No Danger to Others: No Duty to Warn:no Physical Aggression / Violence:No  Access to Firearms a concern: No  Gang Involvement:No   Subjective: I met with the client face-to-face.  We both had facemasks. The client discussed the fact that he has attacks of nausea.  He attributes it to 1) not enough water 2) needing to eat more regularly, 3 times a day 3) being aware of the foods that he eats.  The medicine he takes for his cancer treatment affect him in relation to this.  He states, "I go off one in October and another in February."  He hopes things will get easier for him after that. Today the client discussed how he gets tuned into people's pain so easily.  "I can feel it or know it intuitively."  He states how his relationships with male friends especially can go down the wrong road.  He finds himself entering into their pain offering help and concern and then getting engulfed by their needs or wants. 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  He is a deep intuitive feeler who is less than 1% of the population.  We discussed how he can understand and know things about people before they are even aware of  it.  I encouraged the client to read a summary of his type in the book by Gwendolyn Grant called "please understand me ".  The client realizes that he married his wife because he was drawn to her pain.  Having a better understanding of what his gifts are in developing the proper boundaries is one of the goals that the client has.  Interventions: Assertiveness/Communication, Motivational Interviewing, Solution-Oriented/Positive Psychology, Psycho-education/Bibliotherapy and Insight-Oriented  Diagnosis:   ICD-10-CM   1. Generalized anxiety disorder  F41.1     Plan: Read "please understand me ", boundaries, assertiveness.  This record has been created using [employer].  Chart creation errors have been sought, but Asmar Brozek not always have been located and corrected. Such creation errors do not reflect on the standard of medical care.  Tabbatha Bordelon, [facility]

## 2019-03-23 ENCOUNTER — Ambulatory Visit (INDEPENDENT_AMBULATORY_CARE_PROVIDER_SITE_OTHER): Payer: 59 | Admitting: Psychiatry

## 2019-03-23 ENCOUNTER — Encounter: Payer: Self-pay | Admitting: Psychiatry

## 2019-03-23 ENCOUNTER — Other Ambulatory Visit: Payer: Self-pay

## 2019-03-23 DIAGNOSIS — F411 Generalized anxiety disorder: Secondary | ICD-10-CM | POA: Diagnosis not present

## 2019-03-23 NOTE — Progress Notes (Signed)
      [facility] Counselor/Therapist Progress Note  Patient ID: DEMARION PONDEXTER, MRN: [id number],    Date: 03/23/2019  Time Spent: 50 minutes   Treatment Type: Individual Therapy  Reported Symptoms: anxious  Mental Status Exam:  Appearance:   Casual     Behavior:  Appropriate  Motor:  Normal  Speech/Language:   Clear and Coherent  Affect:  Appropriate  Mood:  anxious  Thought process:  normal  Thought content:    WNL  Sensory/Perceptual disturbances:    WNL  Orientation:  oriented to person, place, time/date and situation  Attention:  Good  Concentration:  Good  Memory:  WNL  Fund of knowledge:   Good  Insight:    Good  Judgment:   Good  Impulse Control:  Good   Risk Assessment: Danger to Self:  No Self-injurious Behavior: No Danger to Others: No Duty to Warn:no Physical Aggression / Violence:No  Access to Firearms a concern: No  Gang Involvement:No   Subjective: I met with the client face-to-face.  We both had facemasks. The client states that the last session was probably the most impactful that he is experienced.  "There are 7 important events in my life, this past session was 1 of them."  We discussed more about the Marcella Dubs type indicator and his personality style the INFP.  We discussed these dynamics as it is related to himself.  The client sees that in his relationships with women he has been invariably drawn to their pain and suffering.  This has resulted in dysfunctional relationships and definitely impacted his choice and marriage.  He did not experience reciprocity and as a result he just ran out of gas and then became physically ill. The client identified trust as being a major issue for him as well as the past hurt from his wife.  I discussed with the client that he has not done a good job loving himself or taking care of himself.  His illnesses have forced him to rethink that.  I asked him to ponder on how well he loves himself and what he can do to get  replenished and refreshed.  Without this he will not be successful going forward.  The client agreed.  Interventions: Motivational Interviewing, Solution-Oriented/Positive Psychology and Insight-Oriented  Diagnosis:   ICD-10-CM   1. Generalized anxiety disorder  F41.1     Plan: Self-care, boundaries, assertiveness, positive self talk, mood independent behavior.  This record has been created using [employer].  Chart creation errors have been sought, but Mariya Mottley not always have been located and corrected. Such creation errors do not reflect on the standard of medical care.  Mashanda Ishibashi, [facility]

## 2019-04-14 ENCOUNTER — Other Ambulatory Visit: Payer: Self-pay

## 2019-04-14 ENCOUNTER — Ambulatory Visit (INDEPENDENT_AMBULATORY_CARE_PROVIDER_SITE_OTHER): Payer: 59 | Admitting: Psychiatry

## 2019-04-14 ENCOUNTER — Encounter: Payer: Self-pay | Admitting: Psychiatry

## 2019-04-14 DIAGNOSIS — F411 Generalized anxiety disorder: Secondary | ICD-10-CM

## 2019-04-14 NOTE — Progress Notes (Signed)
      [facility] Counselor/Therapist Progress Note  Patient ID: CHA GOMILLION, MRN: [id number],    Date: 04/14/2019  Time Spent: 45 minutes   Treatment Type: Individual Therapy  Reported Symptoms: anxious  Mental Status Exam:  Appearance:   Casual     Behavior:  Appropriate  Motor:  Normal  Speech/Language:   Clear and Coherent  Affect:  Appropriate  Mood:  anxious  Thought process:  normal  Thought content:    WNL  Sensory/Perceptual disturbances:    WNL  Orientation:  oriented to person, place, time/date and situation  Attention:  Good  Concentration:  Good  Memory:  WNL  Fund of knowledge:   Good  Insight:    Good  Judgment:   Good  Impulse Control:  Good   Risk Assessment: Danger to Self:  No Self-injurious Behavior: No Danger to Others: No Duty to Warn:no Physical Aggression / Violence:No  Access to Firearms a concern: No  Gang Involvement:No   Subjective: The client's daughter was actually scheduled for the appointment today.  She was sick with irritable bowel syndrome.  The client switched and came in her stead. I asked the client how he was doing with trust since that had been such an issue for him.  The client feels that since we did the work with the eye-movement desensitization and reprocessing that he now feels exceptionally neutral with his ex-wife.  He has no anger and no resentment towards her.  In fact his anxiety is substantially lower than it has been. Physically even with his issues of cancer, leukemia, cardiovascular issues, and emphysema he is in pretty good shape.  His finances, he took over in the last few years and is seeing a turnaround in his investment strategies working with another Electrical engineer.  Emotionally he feels like he is also in good shape.  He has been very thoughtful about what kind of relationship he is looking for.  We talked about someone that was better matched for him in terms of his personality style he agreed that that would be a good  thing to look at.  They could both possibly take the Marcella Dubs type indicator and the Enneagram to make sure they match up well. The client feels that this is a good idea.  Interventions: Assertiveness/Communication, Motivational Interviewing, Solution-Oriented/Positive Psychology and Insight-Oriented  Diagnosis:   ICD-10-CM   1. Generalized anxiety disorder  F41.1     Plan: Boundaries, assertiveness, self-care, exercise.  This record has been created using [employer].  Chart creation errors have been sought, but Aysel Gilchrest not always have been located and corrected. Such creation errors do not reflect on the standard of medical care.  Darika Ildefonso, [facility]

## 2019-04-20 ENCOUNTER — Other Ambulatory Visit: Payer: Self-pay

## 2019-04-20 ENCOUNTER — Encounter: Payer: Self-pay | Admitting: Psychiatry

## 2019-04-20 ENCOUNTER — Ambulatory Visit (INDEPENDENT_AMBULATORY_CARE_PROVIDER_SITE_OTHER): Payer: 59 | Admitting: Psychiatry

## 2019-04-20 DIAGNOSIS — F411 Generalized anxiety disorder: Secondary | ICD-10-CM

## 2019-04-20 NOTE — Progress Notes (Signed)
      [facility] Counselor/Therapist Progress Note  Patient ID: Melvin Martin, MRN: [id number],    Date: 04/20/2019  Time Spent: 39 Minutes   Treatment Type: Individual Therapy  Reported Symptoms: anxious  Mental Status Exam:  Appearance:   Casual     Behavior:  Appropriate  Motor:  Normal  Speech/Language:   Clear and Coherent  Affect:  Appropriate  Mood:  anxious  Thought process:  normal  Thought content:    WNL  Sensory/Perceptual disturbances:    WNL  Orientation:  oriented to person, place, time/date and situation  Attention:  Good  Concentration:  Good  Memory:  WNL  Fund of knowledge:   Good  Insight:    Good  Judgment:   Good  Impulse Control:  Good   Risk Assessment: Danger to Self:  No Self-injurious Behavior: No Danger to Others: No Duty to Warn:no Physical Aggression / Violence:No  Access to Firearms a concern: No  Gang Involvement:No   Subjective: The client reports that he has been searching for a new house and just made a contract on 1.  He is very satisfied because all the major things have been done.  He is looking for secure place for his oldest daughter and himself to live.  His concern for his daughter is that, "can she make friends?"  She is fearful and unmotivated. The client discussed at length about his oldest daughter who just turned 31.  Most of her friends have dropped off and she isolates and stays to herself.  If he tries to suggest something she becomes quite oppositional and defiant.  I suggested to the client that he plan activities that he knows she is interested in.  Client's daughter likes make-up.  There is a store at the local shopping mall that is exclusively from a cup.  Johney Perotti be doing an outing there?  The client agrees this Keslie Gritz be a good idea. The client is also worried about school.  He Juliahna Wiswell just have to get tutors for her since he cannot find a private school for her to attend.  The chances of her completing online schooling by  herself are nil.  He knows his daughter will not participate unless there is someone there. The client will continue with his self-care which is critical to to the illnesses he has.  He exercises regularly.  I encouraged the client to be assertive and have good boundaries with his daughter.  Interventions: Assertiveness/Communication, Motivational Interviewing, Solution-Oriented/Positive Psychology and Insight-Oriented  Diagnosis:   ICD-10-CM   1. Generalized anxiety disorder  F41.1     Plan: Assertiveness, boundaries, self-care, exercise.  Maanasa Aderhold, [facility]

## 2019-05-03 ENCOUNTER — Ambulatory Visit (INDEPENDENT_AMBULATORY_CARE_PROVIDER_SITE_OTHER): Payer: 59 | Admitting: Psychiatry

## 2019-05-03 ENCOUNTER — Encounter: Payer: Self-pay | Admitting: Psychiatry

## 2019-05-03 ENCOUNTER — Other Ambulatory Visit: Payer: Self-pay

## 2019-05-03 DIAGNOSIS — F411 Generalized anxiety disorder: Secondary | ICD-10-CM | POA: Diagnosis not present
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
## 2019-06-02 ENCOUNTER — Ambulatory Visit (INDEPENDENT_AMBULATORY_CARE_PROVIDER_SITE_OTHER): Payer: 59 | Admitting: Psychiatry

## 2019-06-02 ENCOUNTER — Ambulatory Visit: Payer: 59 | Admitting: Psychiatry

## 2019-06-02 ENCOUNTER — Other Ambulatory Visit: Payer: Self-pay

## 2019-06-02 ENCOUNTER — Encounter: Payer: Self-pay | Admitting: Psychiatry

## 2019-06-02 DIAGNOSIS — F411 Generalized anxiety disorder: Secondary | ICD-10-CM | POA: Diagnosis not present

## 2019-06-02 NOTE — Progress Notes (Signed)
      [facility] Counselor/Therapist Progress Note  Patient ID: Melvin Martin, MRN: [id number],    Date: 06/02/2019  Time Spent: 50 minutes   Treatment Type: Individual Therapy  Reported Symptoms: anxious   Mental Status Exam:  Appearance:   Casual     Behavior:  Appropriate  Motor:  Normal  Speech/Language:   Clear and Coherent  Affect:  Appropriate  Mood:  anxious  Thought process:  normal  Thought content:    WNL  Sensory/Perceptual disturbances:    WNL  Orientation:  oriented to person, place, time/date and situation  Attention:  Good  Concentration:  Good  Memory:  WNL  Fund of knowledge:   Good  Insight:    Good  Judgment:   Good  Impulse Control:  Good   Risk Assessment: Danger to Self:  No Self-injurious Behavior: No Danger to Others: No Duty to Warn:no Physical Aggression / Violence:No  Access to Firearms a concern: No  Gang Involvement:No   Subjective: The client states that his oldest daughter has finally agreed to attend [employer].  The plan is for him to transport her up this next weekend.  She is anxious about attending and worried she will not make any friends.  The client states he has been anxious for her as well.  He feels this is a very tenuous time for her and he hopes to get her settled at the school before she changes her mind.  The client had a hard conversation with his daughter telling her that with just a ninth grade education the only work she would be able to get will be picking up trash.  She would also probably have to have more than one job to support herself.  It is more important to the clients daughter to have a better job than worrying about friendships. The client states when his daughter is negative he can feel it and breathe it in.  "Then I mix it with empathy and breathe it out as compassion." The client has closed on his house today and plans to move in this weekend.  He is still good friends with a woman he wants to date.  He  had a dream about the perfect marriage.  It was encouraging but he realizes that he wants more than just a happy life.  He wants a life with meaning and passion.  This woman that he is friends with he believes that could happen with her.  The client understands that he has to accept that she is not in the same place he is.  We discussed the concept of radical acceptance and the necessity for him to embrace this.  The client agrees.  Interventions: Assertiveness/Communication, Motivational Interviewing, Solution-Oriented/Positive Psychology and Insight-Oriented  Diagnosis:   ICD-10-CM   1. Generalized anxiety disorder  F41.1     Plan: Boundaries, assertiveness, radical acceptance, exercise, self-care.  Elexis Pollak, [facility]

## 2019-06-06 ENCOUNTER — Ambulatory Visit: Payer: 59 | Admitting: Psychiatry

## 2019-07-14 ENCOUNTER — Telehealth: Payer: Self-pay | Admitting: Cardiology

## 2019-07-14 NOTE — Telephone Encounter (Signed)
Agree with recommendations  Yahir Tavano MD, FACC  

## 2019-07-14 NOTE — Telephone Encounter (Signed)
Called patient- he states that last night he had very sharp pain across his chest- he tried some over the counter acid reflux medicine, and nothing relieved it, other than the nitroglycerin. He only took one nitroglycerin. He states he did have some chest pain the night before last night, but nothing as bad and it went away. He states last night was strong, no current chest pain currently, but states he is a little sore this morning on his left side. Patient denies SOB, or swelling.   BP early this morning was 101/61 a little after taking the nitroglycerin- patient does have emphysema which could be cause in lower O2 level.  Patient last seen in office 10/2018- told to follow up in one year. Current BP now and HR- 115/56 HR 59  Patient does exercise for 5 days during the week- but decided against it today.   I advised patient to take it easy over the weekend- continue to monitor chest pains, and BP/HR; I made appointment for patient next Tuesday with NP to be seen in office for EKG and work up to make sure not other issues- but did advise if pain returns throughout weekend and is worse he could call our on call doctors to discuss- or go to ER to be evaluated. Patient verbalized understanding, he is aware message will be sent to MD to make sure of no other recommendations at this time.

## 2019-07-14 NOTE — Telephone Encounter (Signed)
New Message   Pt c/o of Chest Pain: STAT if CP now or developed within 24 hours  1. Are you having CP right now? no  2. Are you experiencing any other symptoms (ex. SOB, nausea, vomiting, sweating)? No symptoms. He states when he had his heart attack he did not have any symptoms.   3. How long have you been experiencing CP? Occurred last night, but the chest pain has since   4. Is your CP continuous or coming and going? Last night the pain was continuous until he took his nitroglycerin.   5. Have you taken Nitroglycerin? Took one last night and the pain subsided.  ?  Patient did take BP last night 101/61(1:07am) his oxygen level was 87.

## 2019-07-14 NOTE — Telephone Encounter (Signed)
Noted thanks °

## 2019-07-17 NOTE — Progress Notes (Signed)
Cardiology Clinic Note   Patient Name: Melvin Martin Date of Encounter: 07/18/2019  Primary Care Provider:  Marton Redwood, MD Primary Cardiologist:  Peter [name], MD  Patient Profile    Melvin Martin presents today for an evaluation of his recent chest pain.  Past Medical History    Past Medical History:  Diagnosis Date  . ADD (attention deficit disorder)   . Asthmatic bronchitis   . Cancer ([place])   . Chest pain    pt relates to GERDS and asthmatic bronchitis  . CLL (chronic lymphocytic leukemia) ([place])   . COPD (chronic obstructive pulmonary disease) ([place])   . Coronary artery disease   . GERD (gastroesophageal reflux disease)   . Hyperlipidemia LDL goal < 70   . Low testosterone   . NSTEMI (non-ST elevated myocardial infarction) [facility]) December 2013   with PCI with DES to the ramus and LAD   Past Surgical History:  Procedure Laterality Date  . CORONARY ANGIOPLASTY WITH STENT PLACEMENT  08/18/2012   DES to the ramus and LAD  . Cyst removal, mouth    . LEFT HEART CATHETERIZATION WITH CORONARY ANGIOGRAM N/A 08/18/2012   Procedure: LEFT HEART CATHETERIZATION WITH CORONARY ANGIOGRAM;  Surgeon: Sherren Mocha, MD;  Location: [facility] CATH LAB;  Service: Cardiovascular;  Laterality: N/A;  . PERCUTANEOUS CORONARY STENT INTERVENTION (PCI-S)  08/18/2012   Procedure: PERCUTANEOUS CORONARY STENT INTERVENTION (PCI-S);  Surgeon: Sherren Mocha, MD;  Location: [facility] CATH LAB;  Service: Cardiovascular;;    Allergies  Allergies  Allergen Reactions  . Metoprolol Shortness Of Breath    Caused BP to increase     History of Present Illness    Melvin Martin is a past medical history of coronary artery disease (NSTEMI in December 2013 with PCI and DES x2 to his ramus and LAD), hyperlipidemia, and GERD.  A Myoview stress test in July 2019 showed a low risk study and an EF of 52%.  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  He is now working for [employer].  He was seen in the hospital  by cardiology on 03/2018 with atypical chest pain.  During this event he stated that he only gets chest pain when he is arguing with his ex-wife.  His EKG at that time was normal.  His troponins were normal.  His Myoview at that time was normal, low risk and EF 52%.  He does have CLL which is being managed at [facility].  He started chemotherapy in September 2019 for progressive lymphadenopathy and leukocytosis.  An echocardiogram there and August 2019 was normal.  He was last seen by Dr. [name] on 10/17/2018.  During that time he was doing well and using his elliptical 5 days a week for 45 minutes at a time.  He denied chest pain and his dyspnea had improved since his chemotherapy.  He denied edema or claudication.  He presents the clinic today and states that on 07/12/2019 and  07/13/2019 he noticed chest discomfort.  The discomfort was worse on 11/5 and was relieved by nitroglycerin.  He states that his chest discomfort was present when he woke up in the morning to use the bathroom and describes it as across the front of his chest and sharp.  He states he has been under a considerable amount of stress trying to get his daughter back in school.  He states she suffers from anxiety.  He states he recently found an online school that he thinks may be a good fit  for her.  He continues to exercise 3-4 times a week for 45 minutes at a time.  He does not have any exertional chest discomfort.  He also states he has had not had any other episodes of chest discomfort since 11/ 4 or 11/ 5.  He continues to exercise for 45 minutes at a time doing various workouts most days of the week and denies exertional chest pain.  I will put him on Imdur 15 mg daily, provide stress reduction techniques, and continue to monitor.  He denies increased shortness of breath, lower extremity edema,  palpitations, melena, hematuria, hemoptysis, diaphoresis, weakness.  Home Medications    Prior to Admission medications   Medication Sig Start  Date End Date Taking? Authorizing Provider  acyclovir (ZOVIRAX) 400 MG tablet  08/08/18   [provider]  aspirin 81 MG chewable tablet Chew by mouth. 08/19/12   [provider]  azelastine (ASTELIN) 0.1 % nasal spray U 1 SPR IEN BID 08/08/18   [provider]  Clobetasol Prop Emollient Base (CLOBETASOL PROPIONATE E EX) Apply 1 application topically as needed (itching).    [provider]  CRESTOR 20 MG tablet Take 10 mg by mouth daily.  07/17/14   [provider]  IBRUTINIB PO Take 140 mg by mouth 3 (three) times daily.     [provider]  Ipratropium-Albuterol (COMBIVENT RESPIMAT) 20-100 MCG/ACT AERS respimat Inhale 1 puff into the lungs 4 (four) times daily as needed for wheezing. 10/27/17 10/27/18  [provider]  lamiVUDine-zidovudine (COMBIVIR) 150-300 MG tablet Take 1 tablet by mouth 2 (two) times daily.    [provider]  nitroGLYCERIN (NITROSTAT) 0.4 MG SL tablet Place 1 tablet (0.4 mg total) under the tongue every 5 (five) minutes as needed for chest pain. 11/14/18   [name], [name], MD  omeprazole (PRILOSEC) 40 MG capsule Take 40 mg by mouth daily.    [provider]  rosuvastatin (CRESTOR) 10 MG tablet Take by mouth.    [provider]  Skin Protectants, Misc. (EUCERIN) cream Apply 1 application topically as needed for dry skin.    [provider]  Sodium Fluoride (PREVIDENT 5000 BOOSTER DT) Place 1 application onto teeth as needed (dental health).    [provider]  STIOLTO RESPIMAT 2.5-2.5 MCG/ACT AERS Inhale 2 puffs into the lungs daily. 03/19/18   [provider]  sulfamethoxazole-trimethoprim (BACTRIM DS,SEPTRA DS) 800-160 MG tablet Take 1 tablet by mouth 3 (three) times a week.    [provider]  Testosterone (TESTOPEL) 75 MG PLLT 1 each by Implant route as directed. Patient states that he receives this medication every 5 month for low testosterone 16 pellets  last done at 1/30    [provider]  VENETOCLAX PO Take by mouth.    [provider]    Family History    Family History  Problem Relation Age of Onset  . Emphysema Mother   . Heart failure Father   . Heart attack Father   . Heart attack Cousin    He indicated that his mother is deceased. He indicated that his father is deceased. He indicated that his maternal grandmother is deceased. He indicated that his maternal grandfather is deceased. He indicated that his paternal grandmother is deceased. He indicated that his paternal grandfather is deceased. He indicated that the status of his cousin is unknown.  Social History    Social History   Socioeconomic History  . Marital status: Divorced  Spouse name: Not on file  . Number of children: Not on file  . Years of education: Not on file  . Highest education level: Not on file  Occupational History  . Not on file  Social Needs  . Financial resource strain: Not on file  . Food insecurity    Worry: Not on file    Inability: Not on file  . Transportation needs    Medical: Not on file    Non-medical: Not on file  Tobacco Use  . Smoking status: Never Smoker  . Smokeless tobacco: Never Used  Substance and Sexual Activity  . Alcohol use: No  . Drug use: No  . Sexual activity: Yes  Lifestyle  . Physical activity    Days per week: Not on file    Minutes per session: Not on file  . Stress: Not on file  Relationships  . Social Herbalist on phone: Not on file    Gets together: Not on file    Attends religious service: Not on file    Active member of club or organization: Not on file    Attends meetings of clubs or organizations: Not on file    Relationship status: Not on file  . Intimate partner violence    Fear of current or ex partner: Not on file    Emotionally abused: Not on file    Physically abused: Not on file    Forced sexual activity: Not on file  Other Topics Concern  . Not on file   Social History Narrative   Lives with wife and 2 small children     Review of Systems    General:  No chills, fever, night sweats or weight changes.  Cardiovascular:  No chest pain, dyspnea on exertion, edema, orthopnea, palpitations, paroxysmal nocturnal dyspnea. Dermatological: No rash, lesions/masses Respiratory: No cough, dyspnea Urologic: No hematuria, dysuria Abdominal:   No nausea, vomiting, diarrhea, bright red blood per rectum, melena, or hematemesis Neurologic:  No visual changes, wkns, changes in mental status. All other systems reviewed and are otherwise negative except as noted above.  Physical Exam    VS:  BP 114/80   Pulse 74   Ht 5\' 10"  (1.778 m)   Wt 167 lb 9.6 oz (76 kg)   BMI 24.05 kg/m  , BMI Body mass index is 24.05 kg/m. GEN: Well nourished, well developed, in no acute distress. HEENT: normal. Neck: Supple, no JVD, carotid bruits, or masses. Cardiac: RRR, no murmurs, rubs, or gallops. No clubbing, cyanosis, edema.  Radials/DP/PT 2+ and equal bilaterally.  Respiratory:  Respirations regular and unlabored, clear to auscultation bilaterally. GI: Soft, nontender, nondistended, BS + x 4. MS: no deformity or atrophy. Skin: warm and dry, no rash. Neuro:  Strength and sensation are intact. Psych: Normal affect.  Accessory Clinical Findings    ECG personally reviewed by me today-normal sinus rhythm septal infarct undetermined age 80 bpm- No acute changes  EKG 10/17/2018 Normal sinus rhythm 76 bpm  Echocardiogram August 2019 at [facility].  Myocardial perfusion study 04/01/2018  Nuclear stress EF: 52%.  The left ventricular ejection fraction is mildly decreased (45-54%).  Blood pressure demonstrated a normal response to exercise.  There was no ST segment deviation noted during stress.  No T wave inversion was noted during stress.  This is a low risk study.   No reversible ischemia. LVEF 52% with normal wall motion. This is a low risk study.   Assessment & Plan  Chest discomfort-07/12/2019 and  07/13/2019 he noticed chest discomfort.  The discomfort was worse on 11/5 and was relieved by nitroglycerin. Start Imdur 15 mg daily Stress reduction techniques Maintain physical activity-exercising 45 minutes most days of the week Heart healthy low-sodium diet  Coronary artery disease-no chest pain today Continue aspirin 81 mg tablet daily Rosuvastatin 10 mg tablet daily Continue nitroglycerin 0.4 mg sublingual tablet as needed Start Imdur 15 mg tablet daily  Hyperlipidemia-LDL 32 03/25/2018 Continue Rosuvastatin 10 mg daily   Disposition: Follow up with Dr. [name] in 2 months.   Jossie Ng. [facility] [place] [address] Office [phone] Fax [phone]

## 2019-07-18 ENCOUNTER — Ambulatory Visit: Payer: Managed Care, Other (non HMO) | Admitting: General Practice

## 2019-07-18 ENCOUNTER — Other Ambulatory Visit: Payer: Self-pay

## 2019-07-18 ENCOUNTER — Encounter: Payer: Self-pay | Admitting: General Practice

## 2019-07-18 VITALS — BP 114/80 | HR 74 | Ht 70.0 in | Wt 167.6 lb

## 2019-07-18 DIAGNOSIS — R079 Chest pain, unspecified: Secondary | ICD-10-CM | POA: Diagnosis not present

## 2019-07-18 DIAGNOSIS — I2510 Atherosclerotic heart disease of native coronary artery without angina pectoris: Secondary | ICD-10-CM

## 2019-07-18 DIAGNOSIS — E7800 Pure hypercholesterolemia, unspecified: Secondary | ICD-10-CM | POA: Diagnosis not present

## 2019-07-18 MED ORDER — ISOSORBIDE MONONITRATE ER 30 MG PO TB24: 15.0000 mg | ORAL_TABLET | Freq: Every day | ORAL | 3 refills | Status: DC

## 2019-07-18 NOTE — Patient Instructions (Addendum)
Medication Instructions:  START- Imdur 15 mg by mouth daily  *If you need a refill on your cardiac medications before your next appointment, please call your pharmacy*  Lab Work: None Ordered  Testing/Procedures: None Ordered  Follow-Up: At [facility], you and your health needs are our priority.  As part of our continuing mission to provide you with exceptional heart care, we have created designated Provider Care Teams.  These Care Teams include your primary Cardiologist (physician) and Advanced Practice Providers (APPs -  Physician Assistants and Nurse Practitioners) who all work together to provide you with the care you need, when you need it.  Your next appointment:   2 Months  The format for your next appointment:   In Person  Provider:   Peter [name], MD    Mindfulness-Based Stress Reduction  Mindfulness-based stress reduction (MBSR) is a program that helps people learn to practice mindfulness. Mindfulness is the practice of intentionally paying attention to the present moment. It can be learned and practiced through techniques such as education, breathing exercises, meditation, and yoga. MBSR includes several mindfulness techniques in one program. MBSR works best when you understand the treatment, are willing to try new things, and can commit to spending time practicing what you learn. MBSR training may include learning about:  How your emotions, thoughts, and reactions affect your body.  New ways to respond to things that cause negative thoughts to start (triggers).  How to notice your thoughts and let go of them.  Practicing awareness of everyday things that you normally do without thinking.  The techniques and goals of different types of meditation. What are the benefits of MBSR? MBSR can have many benefits, which include helping you to:  Develop self-awareness. This refers to knowing and understanding yourself.  Learn skills and attitudes that help you to  participate in your own health care.  Learn new ways to care for yourself.  Be more accepting about how things are, and let things go.  Be less judgmental and approach things with an open mind.  Be patient with yourself and trust yourself more. MBSR has also been shown to:  Reduce negative emotions, such as depression and anxiety.  Improve memory and focus.  Change how you sense and approach pain.  Boost your body's ability to fight infections.  Help you connect better with other people.  Improve your sense of well-being. Follow these instructions at home:   Find a local in-person or online MBSR program.  Set aside some time regularly for mindfulness practice.  Find a mindfulness practice that works best for you. This may include one or more of the following: ? Meditation. Meditation involves focusing your mind on a certain thought or activity. ? Breathing awareness exercises. These help you to stay present by focusing on your breath. ? Body scan. For this practice, you lie down and pay attention to each part of your body from head to toe. You can identify tension and soreness and intentionally relax parts of your body. ? Yoga. Yoga involves stretching and breathing, and it can improve your ability to move and be flexible. It can also provide an experience of testing your body's limits, which can help you release stress. ? Mindful eating. This way of eating involves focusing on the taste, texture, color, and smell of each bite of food. Because this slows down eating and helps you feel full sooner, it can be an important part of a weight-loss plan.  Find a podcast or recording that  provides guidance for breathing awareness, body scan, or meditation exercises. You can listen to these any time when you have a free moment to rest without distractions.  Follow your treatment plan as told by your health care provider. This may include taking regular medicines and making changes to  your diet or lifestyle as recommended. How to practice mindfulness To do a basic awareness exercise:  Find a comfortable place to sit.  Pay attention to the present moment. Observe your thoughts, feelings, and surroundings just as they are.  Avoid placing judgment on yourself, your feelings, or your surroundings. Make note of any judgment that comes up, and let it go.  Your mind may wander, and that is okay. Make note of when your thoughts drift, and return your attention to the present moment. To do basic mindfulness meditation:  Find a comfortable place to sit. This may include a stable chair or a firm floor cushion. ? Sit upright with your back straight. Let your arms fall next to your side with your hands resting on your legs. ? If sitting in a chair, rest your feet flat on the floor. ? If sitting on a cushion, cross your legs in front of you.  Keep your head in a neutral position with your chin dropped slightly. Relax your jaw and rest the tip of your tongue on the roof of your mouth. Drop your gaze to the floor. You can close your eyes if you like.  Breathe normally and pay attention to your breath. Feel the air moving in and out of your nose. Feel your belly expanding and relaxing with each breath.  Your mind may wander, and that is okay. Make note of when your thoughts drift, and return your attention to your breath.  Avoid placing judgment on yourself, your feelings, or your surroundings. Make note of any judgment or feelings that come up, let them go, and bring your attention back to your breath.  When you are ready, lift your gaze or open your eyes. Pay attention to how your body feels after the meditation. Where to find more information You can find more information about MBSR from:  Your health care provider.  Community-based meditation centers or programs.  Programs offered near you. Summary  Mindfulness-based stress reduction (MBSR) is a program that teaches you  how to intentionally pay attention to the present moment. It is used with other treatments to help you cope better with daily stress, emotions, and pain.  MBSR focuses on developing self-awareness, which allows you to respond to life stress without judgment or negative emotions.  MBSR programs may involve learning different mindfulness practices, such as breathing exercises, meditation, yoga, body scan, or mindful eating. Find a mindfulness practice that works best for you, and set aside time for it on a regular basis. This information is not intended to replace advice given to you by your health care provider. Make sure you discuss any questions you have with your health care provider. Document Released: 12/31/2016 Document Revised: 08/06/2017 Document Reviewed: 12/31/2016 Elsevier Patient Education  2020 [employer].

## 2019-07-24 ENCOUNTER — Telehealth: Payer: Self-pay | Admitting: General Practice

## 2019-07-24 NOTE — Telephone Encounter (Signed)
°  Pt c/o medication issue:  1. Name of Medication: isosorbide mononitrate (IMDUR) 30 MG 24 hr tablet  2. How are you currently taking this medication (dosage and times per day)? 0.5 tablets once daily  3. Are you having a reaction (difficulty breathing--STAT)? Back pain, Low bp. Dizziness, low o2 stats  4. What is your medication issue? The patient took his first dose of medication yesterday, and this medication significantly dropped his BP. HE exercises regularly and takes his BP before and after exercise. His BP was 92/54 HR 64 and o2 level was 90. He did not feel safe exercising this morning. He only took one dose but is still feeling the effects of the medicine  Patient does not feel in danger of anything, but he is reluctant to keep taking this medication because he doesn't feel right.

## 2019-07-24 NOTE — Telephone Encounter (Signed)
Will await for NP to address.

## 2019-07-24 NOTE — Telephone Encounter (Signed)
Will route to PharmD to advise- they received his symptoms at time of call.

## 2019-07-24 NOTE — Telephone Encounter (Signed)
Called patient and advised of message. Patient states he has felt fine other than taking that medication.  Patient verbalized understanding.

## 2019-07-24 NOTE — Telephone Encounter (Signed)
IMDUR was prescribed to treat his chest pain.  APP to address question and possible discontinuation.

## 2019-09-21 NOTE — Progress Notes (Signed)
Melvin Martin Date of Birth: 29-Jul-1956 Medical Record [id number]  History of Present Illness: Melvin Martin is seen for follow up CAD.   He has known CAD with NSTEMI in December of 2013 with PCI that included DES to the ramus and LAD, HLD and GERD. 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 He had a Myoview study in January 2016 that showed normal perfusion and EF 49%.   He was seen by our service in the hospital in July 2019 with atypical chest pain. States the only time he gets chest pain is when arguing with his ex wife.  Ecg was normal. Troponin levels were normal. Repeat Myoview as an outpatient was normal.  He does have CLL and started on chemotherapy in September 2019 for progressive lymphadenopathy and leucocytosis at [facility]. Echo there in August 2019 showed a normal study. He is now on Ibrutinib and is scheduled to be through with this in February.   He was seen by Coletta Memos NP in November with chest pain when patient under a lot of stress. Was started on Imdur 15 mg daily. States he took one dose and it dropped his BP real low so he didn't continue it. He has had no further chest pain. He has been exercising regularly for 45 minutes on an elliptical machine. For a variety of reasons he did not exercise for 3 weeks and noted a significant drop in his pulmonary capacity. Building this back up now.      Current Outpatient Medications  Medication Sig Dispense Refill  . acyclovir (ZOVIRAX) 400 MG tablet Take 400 mg by mouth daily.   10  . aspirin 81 MG chewable tablet Chew by mouth.    Marland Kitchen azelastine (ASTELIN) 0.1 % nasal spray U 1 SPR IEN BID  11  . IBRUTINIB PO Take 140 mg by mouth 3 (three) times daily.     . metoprolol succinate (TOPROL-XL) 25 MG 24 hr tablet Take 25 mg by mouth daily.    Marland Kitchen MYRBETRIQ 50 MG TB24 tablet Take 1 tablet by mouth daily.    . nitroGLYCERIN (NITROSTAT) 0.4 MG SL tablet Place 1 tablet (0.4 mg total) under the tongue every 5 (five)  minutes as needed for chest pain. 25 tablet 12  . omeprazole (PRILOSEC) 40 MG capsule Take 40 mg by mouth daily.    . rosuvastatin (CRESTOR) 10 MG tablet Take by mouth.    . Skin Protectants, Misc. (EUCERIN) cream Apply 1 application topically as needed for dry skin.    . Sodium Fluoride (PREVIDENT 5000 BOOSTER DT) Place 1 application onto teeth as needed (dental health).    . STIOLTO RESPIMAT 2.5-2.5 MCG/ACT AERS Inhale 2 puffs into the lungs daily.    Marland Kitchen sulfamethoxazole-trimethoprim (BACTRIM DS,SEPTRA DS) 800-160 MG tablet Take 1 tablet by mouth 3 (three) times a week.    . tamsulosin (FLOMAX) 0.4 MG CAPS capsule Take 0.4 mg by mouth at bedtime.    . Testosterone Undecanoate 237 MG CAPS Take 1 capsule by mouth 2 (two) times daily.    . vitamin B-12 (CYANOCOBALAMIN) 1000 MCG tablet Take 1 tablet by mouth daily.    . Ipratropium-Albuterol (COMBIVENT RESPIMAT) 20-100 MCG/ACT AERS respimat Inhale 1 puff into the lungs 4 (four) times daily as needed for wheezing.     No current facility-administered medications for this visit.    Allergies  Allergen Reactions  . Metoprolol Shortness Of Breath    Caused BP to increase   .  Imdur [Isosorbide Nitrate]     Decreased bp    Past Medical History:  Diagnosis Date  . ADD (attention deficit disorder)   . Asthmatic bronchitis   . Cancer ([place])   . Chest pain    pt relates to GERDS and asthmatic bronchitis  . CLL (chronic lymphocytic leukemia) ([place])   . COPD (chronic obstructive pulmonary disease) ([place])   . Coronary artery disease   . GERD (gastroesophageal reflux disease)   . Hyperlipidemia LDL goal < 70   . Low testosterone   . NSTEMI (non-ST elevated myocardial infarction) [facility]) December 2013   with PCI with DES to the ramus and LAD    Past Surgical History:  Procedure Laterality Date  . CORONARY ANGIOPLASTY WITH STENT PLACEMENT  08/18/2012   DES to the ramus and LAD  . Cyst removal, mouth    . LEFT HEART CATHETERIZATION WITH CORONARY  ANGIOGRAM N/A 08/18/2012   Procedure: LEFT HEART CATHETERIZATION WITH CORONARY ANGIOGRAM;  Surgeon: Sherren Mocha, MD;  Location: [facility] CATH LAB;  Service: Cardiovascular;  Laterality: N/A;  . PERCUTANEOUS CORONARY STENT INTERVENTION (PCI-S)  08/18/2012   Procedure: PERCUTANEOUS CORONARY STENT INTERVENTION (PCI-S);  Surgeon: Sherren Mocha, MD;  Location: [facility] CATH LAB;  Service: Cardiovascular;;    Social History   Tobacco Use  Smoking Status Never Smoker  Smokeless Tobacco Never Used    Social History   Substance and Sexual Activity  Alcohol Use No    Family History  Problem Relation Age of Onset  . Emphysema Mother   . Heart failure Father   . Heart attack Father   . Heart attack Cousin     Review of Systems: The review of systems is per the HPI.  All other systems were reviewed and are negative.  Physical Exam: BP (!) 139/92   Pulse 80   Ht 5\' 10"  (1.778 m)   Wt 173 lb (78.5 kg)   SpO2 95%   BMI 24.82 kg/m  GENERAL:  Well appearing WM in NAD HEENT:  PERRL, EOMI, sclera are clear. Oropharynx is clear. NECK:  No jugular venous distention, carotid upstroke brisk and symmetric, no bruits, no thyromegaly or adenopathy LUNGS:  Clear to auscultation bilaterally CHEST:  Unremarkable HEART:  RRR,  PMI not displaced or sustained,S1 and S2 within normal limits, no S3, no S4: no clicks, no rubs, no murmurs ABD:  Soft, nontender. BS +, no masses or bruits. No hepatomegaly, no splenomegaly EXT:  2 + pulses throughout, no edema, no cyanosis no clubbing SKIN:  Warm and dry.  No rashes NEURO:  Alert and oriented x 3. Cranial nerves II through XII intact. PSYCH:  Cognitively intact    LABORATORY DATA: Lab Results  Component Value Date   WBC 8.8 08/09/2018   HGB 14.5 08/09/2018   HCT 44.1 08/09/2018   PLT 181 08/09/2018   GLUCOSE 97 08/09/2018   CHOL 66 03/25/2018   TRIG 109 03/25/2018   HDL 12 (L) 03/25/2018   LDLCALC 32 03/25/2018   ALT 21 08/09/2018   AST 23  08/09/2018   NA 135 08/09/2018   K 3.7 08/09/2018   CL 103 08/09/2018   CREATININE 0.97 08/09/2018   BUN 15 08/09/2018   CO2 24 08/09/2018   TSH 1.564 08/18/2012   INR 0.98 01/12/2017    Dated 11/16/18: cholesterol 80, triglycerides 74, HDL 32, LDL 33. Normal CBC, TSH, CMET   Myoview 04/01/18: Study Highlights     Nuclear stress EF: 52%.  The left ventricular ejection  fraction is mildly decreased (45-54%).  Blood pressure demonstrated a normal response to exercise.  There was no ST segment deviation noted during stress.  No T wave inversion was noted during stress.  This is a low risk study.   No reversible ischemia. LVEF 52% with normal wall motion. This is a low risk study   Echo 05/05/18:  NORMAL LEFT VENTRICULAR SYSTOLIC FUNCTION  NORMAL LA PRESSURES WITH NORMAL DIASTOLIC FUNCTION  NORMAL RIGHT VENTRICULAR SYSTOLIC FUNCTION  VALVULAR REGURGITATION: TRIVIAL MR, TRIVIAL TR  NO VALVULAR STENOSIS  INSUFFICIENT TR TO ASSES RVSP.   Assessment / Plan:  1. CAD - past NSTEMI with PCI of the LAD and ramus intermediate with DES in 2013.  Normal Myoview study in July 2019 and normal Echo in August 2019. He is asymptomatic now. Continue ASA, Toprol, and statin. Continue exercise program.   2. HLD - on statin therapy. Last LDL excellent. Labs followed by primary care.   3. CLL. Now on chemotherapy with improvement.   4. COPD. No history of smoking. Followed by pulmonary. Continue inhalers and exercise program.   Follow up in one year.

## 2019-09-28 ENCOUNTER — Encounter: Payer: Self-pay | Admitting: Cardiology

## 2019-09-28 ENCOUNTER — Other Ambulatory Visit: Payer: Self-pay

## 2019-09-28 ENCOUNTER — Ambulatory Visit: Payer: Managed Care, Other (non HMO) | Admitting: Cardiology

## 2019-09-28 VITALS — BP 139/92 | HR 80 | Ht 70.0 in | Wt 173.0 lb

## 2019-09-28 DIAGNOSIS — I2510 Atherosclerotic heart disease of native coronary artery without angina pectoris: Secondary | ICD-10-CM

## 2019-09-28 DIAGNOSIS — E7800 Pure hypercholesterolemia, unspecified: Secondary | ICD-10-CM | POA: Diagnosis not present

## 2020-01-25 ENCOUNTER — Emergency Department
Admission: EM | Admit: 2020-01-25 | Discharge: 2020-01-25 | Disposition: A | Discharge status: Home / Self Care | Payer: Managed Care, Other (non HMO) | Source: Home / Self Care

## 2020-01-25 ENCOUNTER — Other Ambulatory Visit: Payer: Self-pay

## 2020-01-25 DIAGNOSIS — M791 Myalgia, unspecified site: Secondary | ICD-10-CM

## 2020-01-25 DIAGNOSIS — S29012A Strain of muscle and tendon of back wall of thorax, initial encounter: Secondary | ICD-10-CM

## 2020-01-25 MED ORDER — METHOCARBAMOL 500 MG PO TABS
500.0000 mg | ORAL_TABLET | Freq: Two times a day (BID) | ORAL | 0 refills | Status: DC
Start: 2020-01-25 — End: 2020-03-02

## 2020-01-25 NOTE — Discharge Instructions (Signed)
  Robaxin (methocarbamol) is a muscle relaxer and may cause drowsiness. Do not drink alcohol, drive, or operate heavy machinery while taking.  You may take 500mg  acetaminophen every 4-6 hours or in combination with ibuprofen 400-600mg  every 6-8 hours as needed for pain and inflammation.   Call to schedule an appointment with sports medicine in 1 week if not improving, sooner if worsening.

## 2020-01-25 NOTE — ED Triage Notes (Signed)
Pt c/o neck pain and RT shoulder (blade) since Saturday. Pain is intermittent, worse at night and first thing in morning. Currently now dull pain. Ice and tylenol prn.

## 2020-01-25 NOTE — ED Provider Notes (Signed)
[facility]    CSN: [id number] Arrival date & time: 01/25/20  1650      History   Chief Complaint Chief Complaint  Patient presents with  . Neck Pain  . Shoulder Pain    RT    HPI Melvin Martin is a 64 y.o. male.   HPI  Melvin Martin is a 64 y.o. male presenting to UC with c/o Right side neck and shoulder pain that is aching and sore for about 5 days.  No known injury but he reports doing various workouts including weight training to help his COPD.  Pain is worse at night and first thing in the morning, gradually improves throughout the day. Pain is dull, 2/10.  He has tried Tylenol and ice as needed. No prior injury or surgery to same shoulder. Denies weakness or numbness to his arms.    Past Medical History:  Diagnosis Date  . ADD (attention deficit disorder)   . Asthmatic bronchitis   . Cancer ([place])   . Chest pain    pt relates to GERDS and asthmatic bronchitis  . CLL (chronic lymphocytic leukemia) ([place])   . COPD (chronic obstructive pulmonary disease) ([place])   . Coronary artery disease   . GERD (gastroesophageal reflux disease)   . Hyperlipidemia LDL goal < 70   . Low testosterone   . NSTEMI (non-ST elevated myocardial infarction) [facility]) December 2013   with PCI with DES to the ramus and LAD    Patient Active Problem List   Diagnosis Date Noted  . Hyperkalemia 03/25/2018  . Chest pain 03/25/2018  . COPD (chronic obstructive pulmonary disease) ([place])   . CLL (chronic lymphocytic leukemia) ([place]) 01/06/2017  . Coronary artery disease   . Non-ST elevation myocardial infarction (NSTEMI), initial care episode ([place]) 08/18/2012  . GERD (gastroesophageal reflux disease)   . Dyslipidemia (high LDL; low HDL)     Past Surgical History:  Procedure Laterality Date  . CORONARY ANGIOPLASTY WITH STENT PLACEMENT  08/18/2012   DES to the ramus and LAD  . Cyst removal, mouth    . LEFT HEART CATHETERIZATION WITH CORONARY ANGIOGRAM N/A 08/18/2012   Procedure: LEFT  HEART CATHETERIZATION WITH CORONARY ANGIOGRAM;  Surgeon: Sherren Mocha, MD;  Location: [facility] CATH LAB;  Service: Cardiovascular;  Laterality: N/A;  . PERCUTANEOUS CORONARY STENT INTERVENTION (PCI-S)  08/18/2012   Procedure: PERCUTANEOUS CORONARY STENT INTERVENTION (PCI-S);  Surgeon: Sherren Mocha, MD;  Location: [facility] CATH LAB;  Service: Cardiovascular;;       Home Medications    Prior to Admission medications   Medication Sig Start Date End Date Taking? Authorizing Provider  acyclovir (ZOVIRAX) 400 MG tablet Take 400 mg by mouth daily.  08/08/18   [provider]  aspirin 81 MG chewable tablet Chew by mouth. 08/19/12   [provider]  azelastine (ASTELIN) 0.1 % nasal spray U 1 SPR IEN BID 08/08/18   [provider]  Ipratropium-Albuterol (COMBIVENT RESPIMAT) 20-100 MCG/ACT AERS respimat Inhale 1 puff into the lungs 4 (four) times daily as needed for wheezing. 10/27/17 07/18/19  [provider]  methocarbamol (ROBAXIN) 500 MG tablet Take 1 tablet (500 mg total) by mouth 2 (two) times daily. 01/25/20   Noe Gens, PA-C  metoprolol succinate (TOPROL-XL) 25 MG 24 hr tablet Take 25 mg by mouth daily.    [provider]  MYRBETRIQ 50 MG TB24 tablet Take 1 tablet by mouth daily. 04/20/19   [provider]  nitroGLYCERIN (NITROSTAT) 0.4 MG SL  tablet Place 1 tablet (0.4 mg total) under the tongue every 5 (five) minutes as needed for chest pain. 11/14/18   [name], [name], MD  omeprazole (PRILOSEC) 40 MG capsule Take 40 mg by mouth daily.    [provider]  rosuvastatin (CRESTOR) 10 MG tablet Take by mouth.    [provider]  Skin Protectants, Misc. (EUCERIN) cream Apply 1 application topically as needed for dry skin.    [provider]  Sodium Fluoride (PREVIDENT 5000 BOOSTER DT) Place 1 application onto teeth as needed (dental health).    [provider]  STIOLTO RESPIMAT 2.5-2.5 MCG/ACT AERS Inhale 2 puffs into  the lungs daily. 03/19/18   [provider]  tamsulosin (FLOMAX) 0.4 MG CAPS capsule Take 0.4 mg by mouth at bedtime. 07/07/19   [provider]  Testosterone Undecanoate 237 MG CAPS Take 1 capsule by mouth 2 (two) times daily.    [provider]  vitamin B-12 (CYANOCOBALAMIN) 1000 MCG tablet Take 1 tablet by mouth daily. 02/21/19 02/21/20  [provider]    Family History Family History  Problem Relation Age of Onset  . Emphysema Mother   . Heart failure Father   . Heart attack Father   . Heart attack Cousin     Social History Social History   Tobacco Use  . Smoking status: Never Smoker  . Smokeless tobacco: Never Used  Substance Use Topics  . Alcohol use: No  . Drug use: No     Allergies   Metoprolol and Imdur [isosorbide nitrate]   Review of Systems Review of Systems  Constitutional: Negative for chills and fever.  Musculoskeletal: Positive for back pain, myalgias and neck pain. Negative for arthralgias, joint swelling and neck stiffness.  Skin: Negative for color change, rash and wound.  Neurological: Negative for weakness and numbness.     Physical Exam Triage Vital Signs ED Triage Vitals  Enc Vitals Group     BP 01/25/20 1710 132/81     Pulse Rate 01/25/20 1710 63     Resp 01/25/20 1710 18     Temp 01/25/20 1710 97.8 F (36.6 C)     Temp Source 01/25/20 1710 Oral     SpO2 01/25/20 1710 95 %     Weight --      Height --      Head Circumference --      Peak Flow --      Pain Score 01/25/20 1713 2     Pain Loc --      Pain Edu? --      Excl. in [place]? --    No data found.  Updated Vital Signs BP 132/81 (BP Location: Left Arm)   Pulse 63   Temp 97.8 F (36.6 C) (Oral)   Resp 18   SpO2 95%   Visual Acuity Right Eye Distance:   Left Eye Distance:   Bilateral Distance:    Right Eye Near:   Left Eye Near:    Bilateral Near:     Physical Exam Vitals and nursing note reviewed.  Constitutional:       Appearance: Normal appearance. He is well-developed.  HENT:     Head: Normocephalic and atraumatic.  Cardiovascular:     Rate and Rhythm: Normal rate and regular rhythm.     Pulses:          Radial pulses are 2+ on the right side.  Pulmonary:     Effort: Pulmonary effort is normal.  Musculoskeletal:        General: Tenderness present. Normal range of motion.     Cervical back: Normal range of motion and neck supple. Muscular tenderness (Right side) present. No spinous process tenderness.       Back:     Comments: Right arm: full ROM with 5/5 strength.  No spinal or bony tenderness of shoulder.  Skin:    General: Skin is warm and dry.     Capillary Refill: Capillary refill takes less than 2 seconds.  Neurological:     Mental Status: He is alert and oriented to person, place, and time.  Psychiatric:        Behavior: Behavior normal.      UC Treatments / Results  Labs (all labs ordered are listed, but only abnormal results are displayed) Labs Reviewed - No data to display  EKG   Radiology No results found.  Procedures Procedures (including critical care time)  Medications Ordered in UC Medications - No data to display  Initial Impression / Assessment and Plan / UC Course  I have reviewed the triage vital signs and the nursing notes.  Pertinent labs & imaging results that were available during my care of the patient were reviewed by me and considered in my medical decision making (see chart for details).     Hx and exam c/w muscle strain Encouraged conservative tx at home AVS provided  Final Clinical Impressions(s) / UC Diagnoses   Final diagnoses:  Muscular pain  Muscle strain of right upper back, initial encounter     Discharge Instructions      Robaxin (methocarbamol) is a muscle relaxer and may cause drowsiness. Do not drink alcohol, drive, or operate heavy machinery while taking.  You may take 500mg  acetaminophen every 4-6 hours or in combination  with ibuprofen 400-600mg  every 6-8 hours as needed for pain and inflammation.   Call to schedule an appointment with sports medicine in 1 week if not improving, sooner if worsening.    ED Prescriptions    Medication Sig Dispense Auth. Provider   methocarbamol (ROBAXIN) 500 MG tablet Take 1 tablet (500 mg total) by mouth 2 (two) times daily. 20 tablet Noe Gens, [place]     I have reviewed the PDMP during this encounter.   Noe Gens, [place] 01/29/20 [phone]

## 2020-02-20 ENCOUNTER — Emergency Department (INDEPENDENT_AMBULATORY_CARE_PROVIDER_SITE_OTHER)
Admission: EM | Admit: 2020-02-20 | Discharge: 2020-02-20 | Disposition: A | Discharge status: Home / Self Care | Payer: Managed Care, Other (non HMO) | Source: Home / Self Care | Attending: Family Medicine | Admitting: Family Medicine

## 2020-02-20 ENCOUNTER — Other Ambulatory Visit: Payer: Self-pay

## 2020-02-20 ENCOUNTER — Encounter: Payer: Self-pay | Admitting: Emergency Medicine

## 2020-02-20 ENCOUNTER — Emergency Department (INDEPENDENT_AMBULATORY_CARE_PROVIDER_SITE_OTHER): Payer: Managed Care, Other (non HMO)

## 2020-02-20 DIAGNOSIS — J441 Chronic obstructive pulmonary disease with (acute) exacerbation: Secondary | ICD-10-CM | POA: Diagnosis not present

## 2020-02-20 DIAGNOSIS — J449 Chronic obstructive pulmonary disease, unspecified: Secondary | ICD-10-CM

## 2020-02-20 MED ORDER — PREDNISONE 20 MG PO TABS: ORAL_TABLET | ORAL | 0 refills | Status: DC

## 2020-02-20 NOTE — ED Triage Notes (Signed)
SOB, Sinus drainage, cough, COPD x 3 weeks O2 Sat runs 92-95%

## 2020-02-20 NOTE — Discharge Instructions (Addendum)
Continue inhalers as prescribed.  If symptoms become significantly worse during the night or over the weekend, proceed to the local emergency room.

## 2020-02-20 NOTE — ED Provider Notes (Signed)
[name]    CSN: [id number] Arrival date & time: 02/20/20  1353      History   Chief Complaint Chief Complaint  Patient presents with  . Shortness of Breath    HPI Melvin Martin is a 64 y.o. male.   Patient has history of COPD and asthmatic bronchitis.  He complains of increased shortness of breath with activity during the past 3 weeks.  He has been more fatigued than usual but feels well otherwise.  He denies recent URI, chest pain, increased cough, and fevers, chills, and sweats.  He notes that his O2 sats range from 92-95%.  He continues to use Combivent and Stiolto inhalers.  The history is provided by the patient.    Past Medical History:  Diagnosis Date  . ADD (attention deficit disorder)   . Asthmatic bronchitis   . Cancer ([place])   . Chest pain    pt relates to GERDS and asthmatic bronchitis  . CLL (chronic lymphocytic leukemia) ([place])   . COPD (chronic obstructive pulmonary disease) ([place])   . Coronary artery disease   . GERD (gastroesophageal reflux disease)   . Hyperlipidemia LDL goal < 70   . Low testosterone   . NSTEMI (non-ST elevated myocardial infarction) [facility]) December 2013   with PCI with DES to the ramus and LAD    Patient Active Problem List   Diagnosis Date Noted  . Hyperkalemia 03/25/2018  . Chest pain 03/25/2018  . COPD (chronic obstructive pulmonary disease) ([place])   . CLL (chronic lymphocytic leukemia) ([place]) 01/06/2017  . Coronary artery disease   . Non-ST elevation myocardial infarction (NSTEMI), initial care episode ([place]) 08/18/2012  . GERD (gastroesophageal reflux disease)   . Dyslipidemia (high LDL; low HDL)     Past Surgical History:  Procedure Laterality Date  . CORONARY ANGIOPLASTY WITH STENT PLACEMENT  08/18/2012   DES to the ramus and LAD  . Cyst removal, mouth    . LEFT HEART CATHETERIZATION WITH CORONARY ANGIOGRAM N/A 08/18/2012   Procedure: LEFT HEART CATHETERIZATION WITH CORONARY ANGIOGRAM;  Surgeon: Sherren Mocha, MD;  Location[facility] CATH LAB;  Service: Cardiovascular;  Laterality: N/A;  . PERCUTANEOUS CORONARY STENT INTERVENTION (PCI-S)  08/18/2012   Procedure: PERCUTANEOUS CORONARY STENT INTERVENTION (PCI-S);  Surgeon: Sherren Mocha, MD;  Location: [facility] CATH LAB;  Service: Cardiovascular;;       Home Medications    Prior to Admission medications   Medication Sig Start Date End Date Taking? Authorizing Provider  Ascorbic Acid (VITAMIN C WITH ROSE HIPS) 1000 MG tablet Take 1,000 mg by mouth daily.   Yes [provider]  loratadine-pseudoephedrine (CLARITIN-D 12-HOUR) 5-120 MG tablet Take 1 tablet by mouth 2 (two) times daily.   Yes [provider]  Melatonin 10 MG CAPS Take by mouth.   Yes [provider]  Zinc 50 MG CAPS Take by mouth.   Yes [provider]  acyclovir (ZOVIRAX) 400 MG tablet Take 400 mg by mouth daily.  08/08/18   [provider]  aspirin 81 MG chewable tablet Chew by mouth. 08/19/12   [provider]  azelastine (ASTELIN) 0.1 % nasal spray U 1 SPR IEN BID 08/08/18   [provider]  Ipratropium-Albuterol (COMBIVENT RESPIMAT) 20-100 MCG/ACT AERS respimat Inhale 1 puff into the lungs 4 (four) times daily as needed for wheezing. 10/27/17 07/18/19  [provider]  methocarbamol (ROBAXIN) 500 MG tablet Take 1 tablet (500 mg total) by mouth 2 (two) times daily. 01/25/20  Noe Gens, PA-C  metoprolol succinate (TOPROL-XL) 25 MG 24 hr tablet Take 25 mg by mouth daily.    [provider]  MYRBETRIQ 50 MG TB24 tablet Take 1 tablet by mouth daily. 04/20/19   [provider]  nitroGLYCERIN (NITROSTAT) 0.4 MG SL tablet Place 1 tablet (0.4 mg total) under the tongue every 5 (five) minutes as needed for chest pain. 11/14/18   [name], [name], MD  omeprazole (PRILOSEC) 40 MG capsule Take 40 mg by mouth daily.    [provider]  predniSONE (DELTASONE) 20 MG tablet Take one tab by mouth twice daily  for 4 days, then one daily. Take with food. 02/20/20   Kandra Nicolas, MD  rosuvastatin (CRESTOR) 10 MG tablet Take by mouth.    [provider]  Skin Protectants, Misc. (EUCERIN) cream Apply 1 application topically as needed for dry skin.    [provider]  Sodium Fluoride (PREVIDENT 5000 BOOSTER DT) Place 1 application onto teeth as needed (dental health).    [provider]  STIOLTO RESPIMAT 2.5-2.5 MCG/ACT AERS Inhale 2 puffs into the lungs daily. 03/19/18   [provider]  tamsulosin (FLOMAX) 0.4 MG CAPS capsule Take 0.4 mg by mouth at bedtime. 07/07/19   [provider]  Testosterone Undecanoate 237 MG CAPS Take 1 capsule by mouth 2 (two) times daily.    [provider]  vitamin B-12 (CYANOCOBALAMIN) 1000 MCG tablet Take 1 tablet by mouth daily. 02/21/19 02/21/20  [provider]    Family History Family History  Problem Relation Age of Onset  . Emphysema Mother   . Heart failure Father   . Heart attack Father   . Heart attack Cousin     Social History Social History   Tobacco Use  . Smoking status: Never Smoker  . Smokeless tobacco: Never Used  Vaping Use  . Vaping Use: Never used  Substance Use Topics  . Alcohol use: No  . Drug use: No     Allergies   Metoprolol and Imdur [isosorbide nitrate]   Review of Systems Review of Systems  No sore throat + cough No pleuritic pain ? wheezing No nasal congestion No post-nasal drainage No sinus pain/pressure No itchy/red eyes No earache No hemoptysis + SOB No fever/chills No nausea No vomiting No abdominal pain No diarrhea No urinary symptoms No skin rash + fatigue No myalgias No headache    Physical Exam Triage Vital Signs ED Triage Vitals  Enc Vitals Group     BP 02/20/20 1528 (!) 143/90     Pulse Rate 02/20/20 1528 74     Resp 02/20/20 1528 20     Temp 02/20/20 1528 98.1 F (36.7 C)     Temp Source 02/20/20 1528 Oral     SpO2  02/20/20 1528 92 %     Weight 02/20/20 1529 170 lb (77.1 kg)     Height 02/20/20 1529 5\' 10"  (1.778 m)     Head Circumference --      Peak Flow --      Pain Score 02/20/20 1528 0     Pain Loc --      Pain Edu? --      Excl. in [place]? --    No data found.  Updated Vital Signs BP (!) 143/90 (BP Location: Right Arm)   Pulse 74   Temp 98.1 F (36.7 C) (Oral)   Resp 20   Ht 5\' 10"  (1.778 m)   Wt 77.1  kg   SpO2 92%   BMI 24.39 kg/m   Visual Acuity Right Eye Distance:   Left Eye Distance:   Bilateral Distance:    Right Eye Near:   Left Eye Near:    Bilateral Near:     Physical Exam Nursing notes and Vital Signs reviewed. Appearance:  Patient appears stated age, and in no acute distress Eyes:  Pupils are equal, round, and reactive to light and accomodation.  Extraocular movement is intact.  Conjunctivae are not inflamed  Ears:  Canals normal.  Tympanic membranes normal.  Nose:  Mildly congested turbinates.  No sinus tenderness.  Pharynx:  Normal Neck:  Supple. No adenopathy. Lungs:  Clear to auscultation.  Breath sounds are equal.  Moving air well. Heart:  Regular rate and rhythm without murmurs, rubs, or gallops.  Abdomen:  Nontender without masses or hepatosplenomegaly.  Bowel sounds are present.  No CVA or flank tenderness.  Extremities:  No edema.  Skin:  No rash present.   UC Treatments / Results  Labs (all labs ordered are listed, but only abnormal results are displayed) Labs Reviewed - No data to display  EKG   Radiology DG Chest 2 View  Result Date: 02/20/2020 CLINICAL DATA:  COPD, cough, dyspnea on exertion EXAM: CHEST - 2 VIEW COMPARISON:  03/25/2018 FINDINGS: Frontal and lateral views of the chest demonstrate an unremarkable cardiac silhouette. The lungs are hyperinflated, with background scarring consistent with emphysema. No airspace disease, effusion, or pneumothorax. IMPRESSION: 1. Stable emphysema.  No acute process. Electronically Signed   By: Randa Ngo M.D.   On: 02/20/2020 16:49    Procedures Procedures (including critical care time)  Medications Ordered in UC Medications - No data to display  Initial Impression / Assessment and Plan / UC Course  I have reviewed the triage vital signs and the nursing notes.  Pertinent labs & imaging results that were available during my care of the patient were reviewed by me and considered in my medical decision making (see chart for details).    Stable chest x-ray reassuring.  Note normal Hgb 16.4 on CBC done 01/23/20. Suspect increased chest congestion allergy related.  Begin prednisone burst/taper. Followup with Family Doctor if not improved in about 2 weeks. Final Clinical Impressions(s) / UC Diagnoses   Final diagnoses:  COPD exacerbation ([place])     Discharge Instructions     Continue inhalers as prescribed.  If symptoms become significantly worse during the night or over the weekend, proceed to the local emergency room.     ED Prescriptions    Medication Sig Dispense Auth. Provider   predniSONE (DELTASONE) 20 MG tablet Take one tab by mouth twice daily for 4 days, then one daily. Take with food. 12 tablet Kandra Nicolas, MD        Kandra Nicolas, MD 02/21/20 [phone]

## 2020-02-29 ENCOUNTER — Inpatient Hospital Stay (HOSPITAL_COMMUNITY)
Admission: AD | Admit: 2020-02-29 | Discharge: 2020-03-02 | DRG: 189 | Disposition: A | Discharge status: Home Health | Payer: Managed Care, Other (non HMO) | Attending: Internal Medicine | Admitting: Internal Medicine

## 2020-02-29 ENCOUNTER — Emergency Department (HOSPITAL_COMMUNITY): Payer: Managed Care, Other (non HMO)

## 2020-02-29 ENCOUNTER — Inpatient Hospital Stay (HOSPITAL_COMMUNITY): Payer: Managed Care, Other (non HMO)

## 2020-02-29 ENCOUNTER — Encounter (HOSPITAL_COMMUNITY): Payer: Self-pay

## 2020-02-29 ENCOUNTER — Other Ambulatory Visit: Payer: Self-pay

## 2020-02-29 DIAGNOSIS — I2510 Atherosclerotic heart disease of native coronary artery without angina pectoris: Secondary | ICD-10-CM | POA: Diagnosis present

## 2020-02-29 DIAGNOSIS — Z8249 Family history of ischemic heart disease and other diseases of the circulatory system: Secondary | ICD-10-CM | POA: Diagnosis not present

## 2020-02-29 DIAGNOSIS — Z856 Personal history of leukemia: Secondary | ICD-10-CM

## 2020-02-29 DIAGNOSIS — J432 Centrilobular emphysema: Secondary | ICD-10-CM | POA: Diagnosis present

## 2020-02-29 DIAGNOSIS — Z7982 Long term (current) use of aspirin: Secondary | ICD-10-CM

## 2020-02-29 DIAGNOSIS — Z888 Allergy status to other drugs, medicaments and biological substances status: Secondary | ICD-10-CM

## 2020-02-29 DIAGNOSIS — Z20822 Contact with and (suspected) exposure to covid-19: Secondary | ICD-10-CM | POA: Diagnosis present

## 2020-02-29 DIAGNOSIS — C9110 Chronic lymphocytic leukemia of B-cell type not having achieved remission: Secondary | ICD-10-CM | POA: Diagnosis present

## 2020-02-29 DIAGNOSIS — D72829 Elevated white blood cell count, unspecified: Secondary | ICD-10-CM | POA: Diagnosis not present

## 2020-02-29 DIAGNOSIS — J811 Chronic pulmonary edema: Secondary | ICD-10-CM | POA: Diagnosis present

## 2020-02-29 DIAGNOSIS — T380X5A Adverse effect of glucocorticoids and synthetic analogues, initial encounter: Secondary | ICD-10-CM | POA: Diagnosis not present

## 2020-02-29 DIAGNOSIS — E785 Hyperlipidemia, unspecified: Secondary | ICD-10-CM | POA: Diagnosis present

## 2020-02-29 DIAGNOSIS — J9601 Acute respiratory failure with hypoxia: Principal | ICD-10-CM | POA: Diagnosis present

## 2020-02-29 DIAGNOSIS — K219 Gastro-esophageal reflux disease without esophagitis: Secondary | ICD-10-CM | POA: Diagnosis present

## 2020-02-29 DIAGNOSIS — Z79899 Other long term (current) drug therapy: Secondary | ICD-10-CM | POA: Diagnosis not present

## 2020-02-29 DIAGNOSIS — I252 Old myocardial infarction: Secondary | ICD-10-CM | POA: Diagnosis not present

## 2020-02-29 DIAGNOSIS — N400 Enlarged prostate without lower urinary tract symptoms: Secondary | ICD-10-CM | POA: Diagnosis present

## 2020-02-29 DIAGNOSIS — Z955 Presence of coronary angioplasty implant and graft: Secondary | ICD-10-CM

## 2020-02-29 DIAGNOSIS — J9811 Atelectasis: Secondary | ICD-10-CM | POA: Diagnosis present

## 2020-02-29 DIAGNOSIS — Z7952 Long term (current) use of systemic steroids: Secondary | ICD-10-CM

## 2020-02-29 DIAGNOSIS — Z825 Family history of asthma and other chronic lower respiratory diseases: Secondary | ICD-10-CM

## 2020-02-29 DIAGNOSIS — E875 Hyperkalemia: Secondary | ICD-10-CM | POA: Diagnosis present

## 2020-02-29 LAB — COMPREHENSIVE METABOLIC PANEL
ALT: 60 U/L — ABNORMAL HIGH (ref 0–44)
AST: 27 U/L (ref 15–41)
Albumin: 3.9 g/dL (ref 3.5–5.0)
Alkaline Phosphatase: 73 U/L (ref 38–126)
Anion gap: 8 (ref 5–15)
BUN: 18 mg/dL (ref 8–23)
CO2: 25 mmol/L (ref 22–32)
Calcium: 8.7 mg/dL — ABNORMAL LOW (ref 8.9–10.3)
Chloride: 102 mmol/L (ref 98–111)
Creatinine, Ser: 1.03 mg/dL (ref 0.61–1.24)
GFR calc Af Amer: >60 mL/min (ref >60)
GFR calc non Af Amer: >60 mL/min (ref >60)
Glucose, Bld: 107 mg/dL — ABNORMAL HIGH (ref 70–99)
Potassium: 4.3 mmol/L (ref 3.5–5.1)
Sodium: 135 mmol/L (ref 135–145)
Total Bilirubin: 0.7 mg/dL (ref 0.3–1.2)
Total Protein: 6.1 g/dL — ABNORMAL LOW (ref 6.5–8.1)

## 2020-02-29 LAB — CBC WITH DIFFERENTIAL/PLATELET
Abs Immature Granulocytes: 0.54 10*3/uL — ABNORMAL HIGH (ref 0.00–0.07)
Basophils Absolute: 0.1 10*3/uL (ref 0.0–0.1)
Basophils Relative: 1 %
Eosinophils Absolute: 0.7 10*3/uL — ABNORMAL HIGH (ref 0.0–0.5)
Eosinophils Relative: 5 %
HCT: 49.8 % (ref 39.0–52.0)
Hemoglobin: 17.4 g/dL — ABNORMAL HIGH (ref 13.0–17.0)
Immature Granulocytes: 4 %
Lymphocytes Relative: 10 %
Lymphs Abs: 1.3 10*3/uL (ref 0.7–4.0)
MCH: 34.7 pg — ABNORMAL HIGH (ref 26.0–34.0)
MCHC: 34.9 g/dL (ref 30.0–36.0)
MCV: 99.2 fL (ref 80.0–100.0)
Monocytes Absolute: 1.7 10*3/uL — ABNORMAL HIGH (ref 0.1–1.0)
Monocytes Relative: 13 %
Neutro Abs: 9 10*3/uL — ABNORMAL HIGH (ref 1.7–7.7)
Neutrophils Relative %: 67 %
Platelets: 185 10*3/uL (ref 150–400)
RBC: 5.02 MIL/uL (ref 4.22–5.81)
RDW: 13 % (ref 11.5–15.5)
WBC: 13.3 10*3/uL — ABNORMAL HIGH (ref 4.0–10.5)
nRBC: 0 % (ref 0.0–0.2)

## 2020-02-29 LAB — SARS CORONAVIRUS 2 BY RT PCR (HOSPITAL ORDER, PERFORMED IN ~~LOC~~ HOSPITAL LAB): SARS Coronavirus 2: NEGATIVE

## 2020-02-29 LAB — CBC
HCT: 50.3 % (ref 39.0–52.0)
Hemoglobin: 17.5 g/dL — ABNORMAL HIGH (ref 13.0–17.0)
MCH: 34.8 pg — ABNORMAL HIGH (ref 26.0–34.0)
MCHC: 34.8 g/dL (ref 30.0–36.0)
MCV: 100 fL (ref 80.0–100.0)
Platelets: 182 10*3/uL (ref 150–400)
RBC: 5.03 MIL/uL (ref 4.22–5.81)
RDW: 13.1 % (ref 11.5–15.5)
WBC: 13.1 10*3/uL — ABNORMAL HIGH (ref 4.0–10.5)
nRBC: 0 % (ref 0.0–0.2)

## 2020-02-29 LAB — TROPONIN I (HIGH SENSITIVITY)
Troponin I (High Sensitivity): 5 ng/L (ref <18)
Troponin I (High Sensitivity): 4 ng/L (ref <18)

## 2020-02-29 LAB — ECHOCARDIOGRAM COMPLETE
Height: 70 in
Weight: 2720 oz

## 2020-02-29 LAB — BRAIN NATRIURETIC PEPTIDE: B Natriuretic Peptide: 19 pg/mL (ref 0.0–100.0)

## 2020-02-29 LAB — TSH: TSH: 1.065 u[IU]/mL (ref 0.350–4.500)

## 2020-02-29 MED ORDER — SALINE SPRAY 0.65 % NA SOLN
1.0000 | NASAL | Status: DC | PRN
  Administered 2020-03-01: 1 via NASAL
  Filled 2020-02-29: qty 44

## 2020-02-29 MED ORDER — LORATADINE 10 MG PO TABS
10.0000 mg | ORAL_TABLET | Freq: Every day | ORAL | Status: DC | PRN
  Filled 2020-02-29: qty 1

## 2020-02-29 MED ORDER — SENNOSIDES-DOCUSATE SODIUM 8.6-50 MG PO TABS: 1.0000 | ORAL_TABLET | Freq: Every evening | ORAL | Status: DC | PRN

## 2020-02-29 MED ORDER — AEROCHAMBER PLUS FLO-VU MEDIUM MISC
1.0000 | Freq: Once | Status: AC
  Administered 2020-02-29: 1
  Filled 2020-02-29: qty 1

## 2020-02-29 MED ORDER — DM-GUAIFENESIN ER 30-600 MG PO TB12
1.0000 | ORAL_TABLET | Freq: Two times a day (BID) | ORAL | Status: DC
  Administered 2020-02-29 – 2020-03-02 (×5): 1 via ORAL
  Filled 2020-02-29 (×5): qty 1

## 2020-02-29 MED ORDER — METHYLPREDNISOLONE SODIUM SUCC 125 MG IJ SOLR
125.0000 mg | Freq: Once | INTRAMUSCULAR | Status: AC
  Administered 2020-02-29: 125 mg via INTRAVENOUS
  Filled 2020-02-29: qty 2

## 2020-02-29 MED ORDER — METHYLPREDNISOLONE SODIUM SUCC 40 MG IJ SOLR
40.0000 mg | Freq: Four times a day (QID) | INTRAMUSCULAR | Status: DC
  Administered 2020-02-29 – 2020-03-02 (×8): 40 mg via INTRAVENOUS
  Filled 2020-02-29 (×8): qty 1

## 2020-02-29 MED ORDER — IPRATROPIUM-ALBUTEROL 0.5-2.5 (3) MG/3ML IN SOLN
3.0000 mL | Freq: Four times a day (QID) | RESPIRATORY_TRACT | Status: DC
  Administered 2020-02-29: 3 mL via RESPIRATORY_TRACT
  Filled 2020-02-29: qty 3

## 2020-02-29 MED ORDER — ASPIRIN 81 MG PO CHEW
81.0000 mg | CHEWABLE_TABLET | Freq: Every day | ORAL | Status: DC
  Administered 2020-02-29 – 2020-03-02 (×3): 81 mg via ORAL
  Filled 2020-02-29 (×3): qty 1

## 2020-02-29 MED ORDER — TAMSULOSIN HCL 0.4 MG PO CAPS
0.4000 mg | ORAL_CAPSULE | Freq: Every day | ORAL | Status: DC
  Administered 2020-02-29 – 2020-03-01 (×2): 0.4 mg via ORAL
  Filled 2020-02-29 (×2): qty 1

## 2020-02-29 MED ORDER — ALBUTEROL SULFATE HFA 108 (90 BASE) MCG/ACT IN AERS
4.0000 | INHALATION_SPRAY | Freq: Once | RESPIRATORY_TRACT | Status: AC
  Administered 2020-02-29: 4 via RESPIRATORY_TRACT
  Filled 2020-02-29: qty 6.7

## 2020-02-29 MED ORDER — ROSUVASTATIN CALCIUM 10 MG PO TABS
10.0000 mg | ORAL_TABLET | Freq: Every day | ORAL | Status: DC
  Administered 2020-02-29 – 2020-03-02 (×3): 10 mg via ORAL
  Filled 2020-02-29 (×3): qty 1

## 2020-02-29 MED ORDER — IPRATROPIUM-ALBUTEROL 0.5-2.5 (3) MG/3ML IN SOLN
3.0000 mL | Freq: Four times a day (QID) | RESPIRATORY_TRACT | Status: DC
  Administered 2020-03-01: 3 mL via RESPIRATORY_TRACT
  Filled 2020-02-29: qty 3

## 2020-02-29 MED ORDER — SODIUM CHLORIDE (PF) 0.9 % IJ SOLN
INTRAMUSCULAR | Status: AC
  Filled 2020-02-29: qty 50

## 2020-02-29 MED ORDER — ACYCLOVIR 400 MG PO TABS
400.0000 mg | ORAL_TABLET | Freq: Two times a day (BID) | ORAL | Status: DC
  Administered 2020-02-29 – 2020-03-02 (×5): 400 mg via ORAL
  Filled 2020-02-29 (×5): qty 1

## 2020-02-29 MED ORDER — ACETAMINOPHEN 325 MG PO TABS: 650.0000 mg | ORAL_TABLET | Freq: Four times a day (QID) | ORAL | Status: DC | PRN

## 2020-02-29 MED ORDER — NITROGLYCERIN 0.4 MG SL SUBL: 0.4000 mg | SUBLINGUAL_TABLET | SUBLINGUAL | Status: DC | PRN

## 2020-02-29 MED ORDER — PANTOPRAZOLE SODIUM 40 MG PO TBEC
40.0000 mg | DELAYED_RELEASE_TABLET | Freq: Every day | ORAL | Status: DC
  Administered 2020-02-29 – 2020-03-02 (×3): 40 mg via ORAL
  Filled 2020-02-29 (×3): qty 1

## 2020-02-29 MED ORDER — HEPARIN SODIUM (PORCINE) 5000 UNIT/ML IJ SOLN
5000.0000 [IU] | Freq: Three times a day (TID) | INTRAMUSCULAR | Status: DC
  Administered 2020-02-29 – 2020-03-01 (×4): 5000 [IU] via SUBCUTANEOUS
  Filled 2020-02-29 (×5): qty 1

## 2020-02-29 MED ORDER — MIRABEGRON ER 25 MG PO TB24
50.0000 mg | ORAL_TABLET | Freq: Every day | ORAL | Status: DC
  Administered 2020-02-29 – 2020-03-02 (×3): 50 mg via ORAL
  Filled 2020-02-29 (×3): qty 2

## 2020-02-29 MED ORDER — POTASSIUM CHLORIDE CRYS ER 20 MEQ PO TBCR
20.0000 meq | EXTENDED_RELEASE_TABLET | Freq: Once | ORAL | Status: AC
  Administered 2020-02-29: 20 meq via ORAL
  Filled 2020-02-29: qty 1

## 2020-02-29 MED ORDER — IPRATROPIUM-ALBUTEROL 0.5-2.5 (3) MG/3ML IN SOLN
3.0000 mL | Freq: Four times a day (QID) | RESPIRATORY_TRACT | Status: DC
  Administered 2020-02-29 (×2): 3 mL via RESPIRATORY_TRACT
  Filled 2020-02-29 (×2): qty 3

## 2020-02-29 MED ORDER — ALBUTEROL SULFATE (2.5 MG/3ML) 0.083% IN NEBU: 2.5000 mg | INHALATION_SOLUTION | Freq: Four times a day (QID) | RESPIRATORY_TRACT | Status: DC | PRN

## 2020-02-29 MED ORDER — ACETAMINOPHEN 325 MG PO TABS
650.0000 mg | ORAL_TABLET | Freq: Once | ORAL | Status: AC
  Administered 2020-02-29: 650 mg via ORAL
  Filled 2020-02-29: qty 2

## 2020-02-29 MED ORDER — ACETAMINOPHEN 650 MG RE SUPP: 650.0000 mg | Freq: Four times a day (QID) | RECTAL | Status: DC | PRN

## 2020-02-29 MED ORDER — ONDANSETRON HCL 4 MG PO TABS: 4.0000 mg | ORAL_TABLET | Freq: Four times a day (QID) | ORAL | Status: DC | PRN

## 2020-02-29 MED ORDER — FUROSEMIDE 10 MG/ML IJ SOLN
40.0000 mg | Freq: Once | INTRAMUSCULAR | Status: AC
  Administered 2020-02-29: 40 mg via INTRAVENOUS
  Filled 2020-02-29: qty 4

## 2020-02-29 MED ORDER — ONDANSETRON HCL 4 MG/2ML IJ SOLN: 4.0000 mg | Freq: Four times a day (QID) | INTRAMUSCULAR | Status: DC | PRN

## 2020-02-29 MED ORDER — AZELASTINE HCL 0.1 % NA SOLN
1.0000 | Freq: Two times a day (BID) | NASAL | Status: DC
  Administered 2020-02-29 – 2020-03-02 (×5): 1 via NASAL
  Filled 2020-02-29: qty 30

## 2020-02-29 MED ORDER — IOHEXOL 350 MG/ML SOLN
100.0000 mL | Freq: Once | INTRAVENOUS | Status: AC | PRN
  Administered 2020-02-29: 100 mL via INTRAVENOUS

## 2020-02-29 NOTE — ED Notes (Signed)
Provider messaged regarding o2 sat of 89% on 2L [place], oxygen increased to 3L [place]

## 2020-02-29 NOTE — H&P (Signed)
History and Physical  Melvin Martin:[id number] DOB: 1956-01-11 DOA: 02/29/2020  Referring physician: Kennith Maes, PA PCP: Marton Redwood, MD  Outpatient Specialists: Dr. Estrella Deeds ( Oncology), Dr. Bridgett Larsson ( Pulmonology, [place]) Patient coming from: Home   Chief Complaint: shortness of breath   HPI: Melvin Martin is a 64 y.o. male with medical history significant for CLL, CAD status post 2 stents (08/2012), asthma, centrilobular emphysema (never smoker) on no oxygen who presents on 02/29/2020 with worsening shortness of breath and nonproductive cough for the past week that is not improved on his home inhalers.  He was started on prednisone taper after urgent care evaluation of 02/20/2020 for presumed chest congestion given stable chest x-ray at that time.  He did not notice any improvement on his own Combivent/Stiolto inhalers.  He noted some slight improvement in his cough on the steroids but continued to have worsening shortness of breath even at rest.  He noted fever 101.4 at home yesterday.  Denies any PND, no orthopnea, no lower leg swelling, no chest pain, no recent sick contacts, no nausea or vomiting  History of CAD.  Status post stents on 08/2012.  He reports having chest pain during that episode, but denies any current chest pain.  He remains very active, uses elliptical 4-5 days a week and battle ropes with no symptoms  CLL.  Not on any current therapy, most recent cycles completed on 10/03/2018.  No unusual bleeding or bruising   ED Course: Afebrile, SPO2 87% on room air needed upwards of 6 L to keep greater than 92%.  Heart rate 108  Lab work notable for WBC 13.1, hemoglobin 17.5, BNP 19, troponin 4.  Chest x-ray-increase patchy/bandlike opacities in lung bases favoring atelectasis on background of chronic emphysema.  CTA negative for PE, developing pulmonary edema and atelectasis, chronic bronchitic features.  Review of Systems:As mentioned in the history of present  illness.Review of systems are otherwise negative Patient seen in the ED.   Past Medical History:  Diagnosis Date  . ADD (attention deficit disorder)   . Asthmatic bronchitis   . Cancer ([place])   . Chest pain    pt relates to GERDS and asthmatic bronchitis  . CLL (chronic lymphocytic leukemia) ([place])   . COPD (chronic obstructive pulmonary disease) ([place])   . Coronary artery disease   . GERD (gastroesophageal reflux disease)   . Hyperlipidemia LDL goal < 70   . Low testosterone   . NSTEMI (non-ST elevated myocardial infarction) [facility]) December 2013   with PCI with DES to the ramus and LAD   Past Surgical History:  Procedure Laterality Date  . CORONARY ANGIOPLASTY WITH STENT PLACEMENT  08/18/2012   DES to the ramus and LAD  . Cyst removal, mouth    . LEFT HEART CATHETERIZATION WITH CORONARY ANGIOGRAM N/A 08/18/2012   Procedure: LEFT HEART CATHETERIZATION WITH CORONARY ANGIOGRAM;  Surgeon: Sherren Mocha, MD;  Location: [facility] CATH LAB;  Service: Cardiovascular;  Laterality: N/A;  . PERCUTANEOUS CORONARY STENT INTERVENTION (PCI-S)  08/18/2012   Procedure: PERCUTANEOUS CORONARY STENT INTERVENTION (PCI-S);  Surgeon: Sherren Mocha, MD;  Location: [facility] CATH LAB;  Service: Cardiovascular;;   Allergies  Allergen Reactions  . Metoprolol Shortness Of Breath    Caused BP to increase   . Imdur [Isosorbide Nitrate]     Decreased bp   Social History:  reports that he has never smoked. He has never used smokeless tobacco. He reports that he does not drink alcohol and does not use drugs.  Family History  Problem Relation Age of Onset  . Emphysema Mother   . Heart failure Father   . Heart attack Father   . Heart attack Cousin      Prior to Admission medications   Medication Sig Start Date End Date Taking? Authorizing Provider  acyclovir (ZOVIRAX) 400 MG tablet Take 400 mg by mouth 2 (two) times daily.  08/08/18  Yes [provider]  Ascorbic Acid (VITAMIN C WITH ROSE HIPS) 1000 MG tablet  Take 1,000 mg by mouth daily.   Yes [provider]  aspirin 81 MG chewable tablet Chew 81 mg by mouth daily.  08/19/12  Yes [provider]  azelastine (ASTELIN) 0.1 % nasal spray Place 1 spray into both nostrils 2 (two) times daily.  08/08/18  Yes [provider]  Ipratropium-Albuterol (COMBIVENT RESPIMAT) 20-100 MCG/ACT AERS respimat Inhale 1 puff into the lungs 4 (four) times daily as needed for wheezing. 10/27/17 02/29/20 Yes [provider]  loratadine-pseudoephedrine (CLARITIN-D 12-HOUR) 5-120 MG tablet Take 1 tablet by mouth daily as needed for allergies.    Yes [provider]  MYRBETRIQ 50 MG TB24 tablet Take 1 tablet by mouth daily. 04/20/19  Yes [provider]  nitroGLYCERIN (NITROSTAT) 0.4 MG SL tablet Place 1 tablet (0.4 mg total) under the tongue every 5 (five) minutes as needed for chest pain. 11/14/18  Yes [name], [name], MD  omeprazole (PRILOSEC) 40 MG capsule Take 40 mg by mouth daily.   Yes [provider]  pseudoephedrine (SUDAFED) 30 MG tablet Take 30 mg by mouth every 4 (four) hours as needed for congestion.   Yes [provider]  rosuvastatin (CRESTOR) 10 MG tablet Take 10 mg by mouth daily.    Yes [provider]  STIOLTO RESPIMAT 2.5-2.5 MCG/ACT AERS Inhale 2 puffs into the lungs daily. 03/19/18  Yes [provider]  tamsulosin (FLOMAX) 0.4 MG CAPS capsule Take 0.4 mg by mouth at bedtime. 07/07/19  Yes [provider]  Testosterone Undecanoate 237 MG CAPS Take 1 capsule by mouth 2 (two) times daily.   Yes [provider]  vitamin B-12 (CYANOCOBALAMIN) 1000 MCG tablet Take 1,000 mcg by mouth daily.   Yes [provider]  Zinc 50 MG CAPS Take 50 mg by mouth daily.    Yes [provider]  methocarbamol (ROBAXIN) 500 MG tablet Take 1 tablet (500 mg total) by mouth 2 (two) times daily. Patient not taking: Reported on 02/29/2020 01/25/20   Noe Gens, PA-C    predniSONE (DELTASONE) 20 MG tablet Take one tab by mouth twice daily for 4 days, then one daily. Take with food. Patient not taking: Reported on 02/29/2020 02/20/20   Kandra Nicolas, MD    Physical Exam: BP 118/81 (BP Location: Left Arm)   Pulse 85   Temp 97.7 F (36.5 C) (Oral)   Resp 18   Ht 5\' 10"  (1.778 m)   Wt 77.1 kg   SpO2 93%   BMI 24.39 kg/m   Constitutional normal appearing male Eyes: EOMI, anicteric, normal conjunctivae ENMT: Oropharynx with moist mucous membranes, normal dentition Cardiovascular: RRR no MRGs, with no peripheral edema, no JVD Respiratory: Normal respiratory effort on supplemental oxygen, no wheezing, no accessory muscle use, no conversational dyspnea, SPO2 90% on 4 L Abdomen: Soft,slightly distended, no tenderness, normal bowel sounds Skin: No rash ulcers, or lesions. Without skin tenting  Neurologic: Grossly no focal neuro deficit. Psychiatric:Appropriate affect, and mood. Mental status AAOx3  Labs on Admission:  Basic Metabolic Panel: Recent Labs  Lab 02/29/20 0327  NA 135  K 4.3  CL 102  CO2 25  GLUCOSE 107*  BUN 18  CREATININE 1.03  CALCIUM 8.7*   Liver Function Tests: Recent Labs  Lab 02/29/20 0327  AST 27  ALT 60*  ALKPHOS 73  BILITOT 0.7  PROT 6.1*  ALBUMIN 3.9   No results for input(s): LIPASE, AMYLASE in the last 168 hours. No results for input(s): AMMONIA in the last 168 hours. CBC: Recent Labs  Lab 02/29/20 0327 02/29/20 1345  WBC 13.3* 13.1*  NEUTROABS 9.0*  --   HGB 17.4* 17.5*  HCT 49.8 50.3  MCV 99.2 100.0  PLT 185 182   Cardiac Enzymes: No results for input(s): CKTOTAL, CKMB, CKMBINDEX, TROPONINI in the last 168 hours.  BNP (last 3 results) Recent Labs    02/29/20 0632  BNP 19.0    ProBNP (last 3 results) No results for input(s): PROBNP in the last 8760 hours.  CBG: No results for input(s): GLUCAP in the last 168 hours.  Radiological Exams on Admission: CT Angio Chest PE W/Cm  &/Or Wo Cm  Result Date: 02/29/2020 CLINICAL DATA:  Two days of increasing shortness of breath with nonproductive cough, known CLL EXAM: CT ANGIOGRAPHY CHEST WITH CONTRAST TECHNIQUE: Multidetector CT imaging of the chest was performed using the standard protocol during bolus administration of intravenous contrast. Multiplanar CT image reconstructions and MIPs were obtained to evaluate the vascular anatomy. CONTRAST:  11mL OMNIPAQUE IOHEXOL 350 MG/ML SOLN COMPARISON:  Radiograph 02/29/2020, CT 01/07/2017 FINDINGS: Cardiovascular: Satisfactory opacification the pulmonary arteries to the segmental level. No pulmonary artery filling defects are identified. Central pulmonary arteries mildly enlarged though similar to the comparison. Cardiac size at the upper limits of normal. Three-vessel coronary artery disease is present. Pericardial effusion. Suboptimal opacification of the thoracic aorta. No acute luminal abnormality is evident. No periaortic stranding or hemorrhage. Shared origin of the brachiocephalic and left common carotid artery. Major venous structures are unremarkable. Mediastinum/Nodes: No mediastinal fluid or gas. Normal thyroid gland and thoracic inlet. No acute abnormality of the trachea or esophagus. No worrisome mediastinal, hilar or axillary adenopathy. Lungs/Pleura: Diffuse moderate centrilobular and paraseptal emphysematous changes are present within the lungs. There are superimposed areas of interlobular septal thickening towards the apices and bases with some increasing hazy and ground-glass opacity in the dependent portions of the lungs which could reflect developing pulmonary edema and/or atelectasis. Redistributed pulmonary vascularity is present as well. No focal consolidative opacity is seen. Mild diffuse airways thickening and scattered secretions. Some bandlike areas of opacity predominantly in the lower lungs are likely reflective of subsegmental atelectasis and/or scarring. Respiratory  motion artifact may limit detection of small pulmonary nodules. Node discernible worrisome pulmonary nodules or masses are identified. Upper Abdomen: No acute abnormalities present in the visualized portions of the upper abdomen. Musculoskeletal: Multilevel degenerative changes are present in the imaged portions of the spine. Additional degenerative changes in the shoulders. No chest wall mass or suspicious bone lesions identified. Review of the MIP images confirms the above findings. IMPRESSION: 1. No evidence of acute pulmonary artery filling defects to suggest pulmonary embolism. 2. Features of mild congestive heart failure with vascular redistribution, septal thickening towards the apices and bases and mixed hazy and ground-glass opacity in the dependent portions of the lungs suggestive of developing pulmonary edema and atelectasis. 3. Moderate centrilobular and paraseptal emphysema (Emphysema (ICD10-J43.9).) 4. Chronic bronchitic features present as well, likely accentuated with some  peribronchovascular cuffing. 5. Aortic Atherosclerosis (ICD10-I70.0). 6. Three-vessel coronary artery calcifications are present. Please note that the presence of coronary artery calcium documents the presence of coronary artery disease, the severity of this disease and any potential stenosis cannot be assessed on this non-gated CT examination. Electronically Signed   By: Lovena Le M.D.   On: 02/29/2020 06:20   DG Chest Portable 1 View  Result Date: 02/29/2020 CLINICAL DATA:  Dyspnea and increasing shortness of breath for the past 48 hours EXAM: PORTABLE CHEST 1 VIEW COMPARISON:  Radiograph 02/20/2020, CT 01/07/2017 FINDINGS: Background of chronic hyperinflation and emphysematous changes with chronically coarsened interstitium seen on comparison studies. There is some increasing mixed patchy and bandlike opacities in the lung bases which are favored to reflect atelectasis though underlying infection is difficult to exclude.  The pulmonary vascularity remains fairly well-defined. No pneumothorax or effusion. Stable cardiomediastinal contours accounting for differences in technique. No acute osseous or soft tissue abnormality. Telemetry leads overlie the chest. IMPRESSION: 1. Increasing mixed patchy and bandlike opacities in the lung bases which are favored to reflect atelectasis though underlying infection is difficult to exclude. 2. Background of chronic emphysematous in coarsened interstitial changes. Electronically Signed   By: Lovena Le M.D.   On: 02/29/2020 03:57   ECHOCARDIOGRAM COMPLETE  Result Date: 02/29/2020    ECHOCARDIOGRAM REPORT   Patient Name:   THAI BURGUENO Date of Exam: 02/29/2020 Medical Rec #:  [id number]     Height:       70.0 in Accession #:    [id number]    Weight:       170.0 lb Date of Birth:  09/27/1955     BSA:          1.948 m Patient Age:    77 years      BP:           136/96 mmHg Patient Gender: M             HR:           95 bpm. Exam Location:  Inpatient Procedure: 2D Echo, Cardiac Doppler and Color Doppler Indications:    Pulmonary Edema [id number]  History:        Patient has no prior history of Echocardiogram examinations.                 Previous Myocardial Infarction and CAD, COPD; Risk                 Factors:Dyslipidemia. Cancer. GERD.  Sonographer:    Tiffany Dance Referring Phys: [id number] [place]  1. Left ventricular ejection fraction, by estimation, is 55 to 60%. The left ventricle has normal function. The left ventricle has no regional wall motion abnormalities. There is moderate left ventricular hypertrophy. Left ventricular diastolic parameters were normal.  2. Right ventricular systolic function is normal. The right ventricular size is normal. Tricuspid regurgitation signal is inadequate for assessing PA pressure.  3. The mitral valve is normal in structure. No evidence of mitral valve regurgitation.  4. The aortic valve is tricuspid. Aortic valve regurgitation is not  visualized. No aortic stenosis is present.  5. The inferior vena cava is normal in size with greater than 50% respiratory variability, suggesting right atrial pressure of 3 mmHg. FINDINGS  Left Ventricle: Left ventricular ejection fraction, by estimation, is 55 to 60%. The left ventricle has normal function. The left ventricle has no regional wall motion abnormalities. The left ventricular internal cavity size was  small. There is moderate  left ventricular hypertrophy. Left ventricular diastolic parameters were normal. Right Ventricle: The right ventricular size is normal. Right vetricular wall thickness was not assessed. Right ventricular systolic function is normal. Tricuspid regurgitation signal is inadequate for assessing PA pressure. Left Atrium: Left atrial size was normal in size. Right Atrium: Right atrial size was normal in size. Pericardium: Trivial pericardial effusion is present. Mitral Valve: The mitral valve is normal in structure. No evidence of mitral valve regurgitation. Tricuspid Valve: The tricuspid valve is normal in structure. Tricuspid valve regurgitation is trivial. Aortic Valve: The aortic valve is tricuspid. Aortic valve regurgitation is not visualized. No aortic stenosis is present. Pulmonic Valve: The pulmonic valve was not well visualized. Pulmonic valve regurgitation is not visualized. Aorta: The aortic root and ascending aorta are structurally normal, with no evidence of dilitation. Venous: The inferior vena cava is normal in size with greater than 50% respiratory variability, suggesting right atrial pressure of 3 mmHg. IAS/Shunts: The interatrial septum was not well visualized.  LEFT VENTRICLE PLAX 2D LVIDd:         3.50 cm  Diastology LVIDs:         2.40 cm  LV e' lateral:   6.96 cm/s LV PW:         1.40 cm  LV E/e' lateral: 8.1 LV IVS:        1.10 cm  LV e' medial:    7.72 cm/s LVOT diam:     2.00 cm  LV E/e' medial:  7.3 LV SV:         37 LV SV Index:   19 LVOT Area:     3.14 cm   RIGHT VENTRICLE             IVC RV Basal diam:  2.80 cm     IVC diam: 1.40 cm RV S prime:     14.70 cm/s TAPSE (M-mode): 2.2 cm LEFT ATRIUM             Index       RIGHT ATRIUM           Index LA diam:        3.80 cm 1.95 cm/m  RA Area:     11.70 cm LA Vol (A2C):   44.6 ml 22.90 ml/m RA Volume:   25.50 ml  13.09 ml/m LA Vol (A4C):   33.3 ml 17.09 ml/m LA Biplane Vol: 39.2 ml 20.12 ml/m  AORTIC VALVE LVOT Vmax:   72.10 cm/s LVOT Vmean:  47.800 cm/s LVOT VTI:    0.117 m  AORTA Ao Root diam: 3.40 cm Ao Asc diam:  3.30 cm MITRAL VALVE MV Area (PHT): 3.77 cm    SHUNTS MV Decel Time: 201 msec    Systemic VTI:  0.12 m MV E velocity: 56.20 cm/s  Systemic Diam: 2.00 cm MV A velocity: 78.50 cm/s MV E/A ratio:  0.72 Oswaldo Milian MD Electronically signed by Oswaldo Milian MD Signature Date/Time: 02/29/2020/2:46:49 PM    Final     EKG: Independently reviewed.  Sinus tachycardia  Assessment/Plan Present on Admission: . Acute respiratory failure with hypoxia ([place]) . CLL (chronic lymphocytic leukemia) ([place]) . Centrilobular emphysema ([place]) . Coronary artery disease . Dyslipidemia (high LDL; low HDL) . GERD (gastroesophageal reflux disease) . BPH (benign prostatic hyperplasia)  Active Problems:   GERD (gastroesophageal reflux disease)   Dyslipidemia (high LDL; low HDL)   Coronary artery disease   CLL (chronic lymphocytic leukemia) ([facility])   Acute  respiratory failure with hypoxia ([facility])   Centrilobular emphysema ([facility])   BPH (benign prostatic hyperplasia)    Acute hypoxic respiratory failure, suspect multifactorial etiology of  emphysema exacerbation and possible CHF.   Previously worsening cough, with worsening shortness of breath.  Mild improvement on oral prednisone as outpatient but now requiring 4 L O2 to keep sats above 92%.  CTA negative for PE but does show developing pulmonary edema suggestive of mild congestive heart failure as well as chronic bronchitic features/centrilobular  emphysema. -Status post IV Lasix in ED, will continue gentle diuresis-TTE to assess EF/diastolic function -Schedule Solu-Medrol, duo nebs -Flutter valve, some spirometry  Developing pulmonary edema and atelectasis, concerning for mild congestive heart failure.  No previous history.  No overt peripheral edema on exam.  No obvious JVD unremarkable.  But persistent shortness of breath with no improvement with inhalers or prednisone. -Obtain TTE --S/p  IV Lasix 40 mg x1 in ED with noted improvement in dyspnea, reassess response and need for further diuresis in am -Monitor ins and outs -Daily weights  Centrilobular emphysema, with likely mild exacerbation.  Nonproductive cough with worsening shortness of breath with mild improvement on oral prednisone but now new O2 requirements.  No overt wheezing on exam.  A never smoker, followed by outpatient pulmonology concern for COPD/asthma overlap, has normal alpha-1 antitrypsin levels and genotype, unclear cause emphysema. -Has outpatient pulmonologist at [place] (Dr. Geryl Councilman) -Flutter valve, sinus artery, Mucinex -Continue scheduled DuoNebs -continue IV Solu-Medrol given significant O2 requirements  CLL. No longer undergoing any antineoplastic treatment.  Followed by oncology as outpatient -Continue prophylaxis with acyclovir  CAD with history of NSTEMI status post PCI 08/2012, stable.   No chest pain.  Troponin trend x2 - -Continue aspirin  GERD, stable -Continue Protonix  HLD, stable -Continue rosuvastatin  BPH, stable -continue Flomax -Monitor output  DVT prophylaxis: Heparin  Code Status: FULL  Family Communication: No family at bedside  Disposition Plan: Admit as inpatient, expect greater than 2 midnight stay given new O2 requirement, need for IV diuresis given pulmonary edema on CT imaging  Consults called: None  Admission status: Admitted as inpatient to [facility] unit.      Desiree Hane MD [facility]  Pager  [phone]  If 7PM-7AM, please contact night-coverage [email or web address] Password [facility]  02/29/2020, 5:45 PM &p

## 2020-02-29 NOTE — ED Provider Notes (Signed)
Melvin Martin   CSN: [id number] Arrival date & time: 02/29/20  0246     History Chief Complaint  Patient presents with  . Shortness of Breath    Melvin Martin is a 64 y.o. male with a history of emphysema, CAD, hyperlipidemia, CAD s/p PCI, & GERD who presents to the ED via EMS with complaints of dyspnea that progressively worsened this evening.  Patient states he had a dry cough for the past 1 to 2 weeks, he was seen at an urgent care for this 02/20/2020 and given a steroid taper which she just finished a few days ago, he states that seem to help her symptoms temporarily, however they seem to come back over the past few days and progressively worsen.  Cough remains dry, has had some chest tightness, and yesterday developed shortness of breath.  This evening he was feeling fatigued prior to going to bed, when he woke up in the middle of the night and tried to get up and walk around he became extremely short of breath prompting EMS call.  Per EMS patient was hypoxic therefore he was placed on oxygen.  Patient denies fever, dizziness, syncope, hemoptysis, leg pain/swelling, hemoptysis, recent surgery/trauma, recent long travel, hormone use, or hx of DVT/PE.  He is not currently receiving chemotherapy and is under surveillance.  He states this somewhat feels like his prior emphysema flares, he has not required hospitalization for emphysema in the past, however this does feel a little bit different.     HPI     Past Medical History:  Diagnosis Date  . ADD (attention deficit disorder)   . Asthmatic bronchitis   . Cancer ([place])   . Chest pain    pt relates to GERDS and asthmatic bronchitis  . CLL (chronic lymphocytic leukemia) ([place])   . COPD (chronic obstructive pulmonary disease) ([place])   . Coronary artery disease   . GERD (gastroesophageal reflux disease)   . Hyperlipidemia LDL goal < 70   . Low testosterone   . NSTEMI (non-ST elevated  myocardial infarction) [facility]) December 2013   with PCI with DES to the ramus and LAD    Patient Active Problem List   Diagnosis Date Noted  . Hyperkalemia 03/25/2018  . Chest pain 03/25/2018  . COPD (chronic obstructive pulmonary disease) ([place])   . CLL (chronic lymphocytic leukemia) ([place]) 01/06/2017  . Coronary artery disease   . Non-ST elevation myocardial infarction (NSTEMI), initial care episode ([place]) 08/18/2012  . GERD (gastroesophageal reflux disease)   . Dyslipidemia (high LDL; low HDL)     Past Surgical History:  Procedure Laterality Date  . CORONARY ANGIOPLASTY WITH STENT PLACEMENT  08/18/2012   DES to the ramus and LAD  . Cyst removal, mouth    . LEFT HEART CATHETERIZATION WITH CORONARY ANGIOGRAM N/A 08/18/2012   Procedure: LEFT HEART CATHETERIZATION WITH CORONARY ANGIOGRAM;  Surgeon: Sherren Mocha, MD;  Location: [facility] CATH LAB;  Service: Cardiovascular;  Laterality: N/A;  . PERCUTANEOUS CORONARY STENT INTERVENTION (PCI-S)  08/18/2012   Procedure: PERCUTANEOUS CORONARY STENT INTERVENTION (PCI-S);  Surgeon: Sherren Mocha, MD;  Location: [facility] CATH LAB;  Service: Cardiovascular;;       Family History  Problem Relation Age of Onset  . Emphysema Mother   . Heart failure Father   . Heart attack Father   . Heart attack Cousin     Social History   Tobacco Use  . Smoking status: Never Smoker  . Smokeless tobacco: Never Used  Vaping Use  . Vaping Use: Never used  Substance Use Topics  . Alcohol use: No  . Drug use: No    Home Medications Prior to Admission medications   Medication Sig Start Date End Date Taking? Authorizing Provider  acyclovir (ZOVIRAX) 400 MG tablet Take 400 mg by mouth daily.  08/08/18   [provider]  Ascorbic Acid (VITAMIN C WITH ROSE HIPS) 1000 MG tablet Take 1,000 mg by mouth daily.    [provider]  aspirin 81 MG chewable tablet Chew by mouth. 08/19/12   [provider]  azelastine (ASTELIN) 0.1 % nasal spray U 1  SPR IEN BID 08/08/18   [provider]  Ipratropium-Albuterol (COMBIVENT RESPIMAT) 20-100 MCG/ACT AERS respimat Inhale 1 puff into the lungs 4 (four) times daily as needed for wheezing. 10/27/17 07/18/19  [provider]  loratadine-pseudoephedrine (CLARITIN-D 12-HOUR) 5-120 MG tablet Take 1 tablet by mouth 2 (two) times daily.    [provider]  Melatonin 10 MG CAPS Take by mouth.    [provider]  methocarbamol (ROBAXIN) 500 MG tablet Take 1 tablet (500 mg total) by mouth 2 (two) times daily. 01/25/20   Noe Gens, PA-C  metoprolol succinate (TOPROL-XL) 25 MG 24 hr tablet Take 25 mg by mouth daily.    [provider]  MYRBETRIQ 50 MG TB24 tablet Take 1 tablet by mouth daily. 04/20/19   [provider]  nitroGLYCERIN (NITROSTAT) 0.4 MG SL tablet Place 1 tablet (0.4 mg total) under the tongue every 5 (five) minutes as needed for chest pain. 11/14/18   [name], [name], MD  omeprazole (PRILOSEC) 40 MG capsule Take 40 mg by mouth daily.    [provider]  predniSONE (DELTASONE) 20 MG tablet Take one tab by mouth twice daily for 4 days, then one daily. Take with food. 02/20/20   Kandra Nicolas, MD  rosuvastatin (CRESTOR) 10 MG tablet Take by mouth.    [provider]  Skin Protectants, Misc. (EUCERIN) cream Apply 1 application topically as needed for dry skin.    [provider]  Sodium Fluoride (PREVIDENT 5000 BOOSTER DT) Place 1 application onto teeth as needed (dental health).    [provider]  STIOLTO RESPIMAT 2.5-2.5 MCG/ACT AERS Inhale 2 puffs into the lungs daily. 03/19/18   [provider]  tamsulosin (FLOMAX) 0.4 MG CAPS capsule Take 0.4 mg by mouth at bedtime. 07/07/19   [provider]  Testosterone Undecanoate 237 MG CAPS Take 1 capsule by mouth 2 (two) times daily.    [provider]  Zinc 50 MG CAPS Take by mouth.    [provider]    Allergies    Metoprolol  and Imdur [isosorbide nitrate]  Review of Systems   Review of Systems  Constitutional: Positive for fatigue. Negative for diaphoresis and fever.  Respiratory: Positive for cough, chest tightness and shortness of breath.   Cardiovascular: Negative for leg swelling.  Gastrointestinal: Negative for abdominal pain, nausea and vomiting.  Neurological: Negative for syncope.  All other systems reviewed and are negative.   Physical Exam Updated Vital Signs BP (!) 127/101 (BP Location: Left Arm)   Pulse (!) 108   Temp 98.4 F (36.9 C) (Oral)   Resp 16   Ht 5\' 10"  (1.778 m)   Wt 77.1 kg   SpO2 (!) 87%   BMI 24.39 kg/m   Physical Exam Vitals and nursing Martin reviewed.  Constitutional:      General: He  is not in acute distress.    Appearance: He is well-developed. He is not toxic-appearing.  HENT:     Head: Normocephalic and atraumatic.  Eyes:     General:        Right eye: No discharge.        Left eye: No discharge.     Conjunctiva/sclera: Conjunctivae normal.  Cardiovascular:     Rate and Rhythm: Regular rhythm. Tachycardia present.     Comments: 2+ symmetric radial pulses. Pulmonary:     Breath sounds: No wheezing, rhonchi or rales.     Comments: Patient becomes tachypneic when speaking extended sentences.  SPO2 is 88 to 91% on room air, improved with 2 L of oxygen via nasal cannula. Abdominal:     General: There is no distension.     Palpations: Abdomen is soft.     Tenderness: There is no abdominal tenderness.  Musculoskeletal:     Cervical back: Neck supple.     Right lower leg: No tenderness. No edema.     Left lower leg: No tenderness. No edema.  Skin:    General: Skin is warm and dry.     Findings: No rash.  Neurological:     Mental Status: He is alert.     Comments: Clear speech.   Psychiatric:        Behavior: Behavior normal.     ED Results / Procedures / Treatments   Labs (all labs ordered are listed, but only abnormal results are displayed) Labs  Reviewed  CBC WITH DIFFERENTIAL/PLATELET - Abnormal; Notable for the following components:      Result Value   WBC 13.3 (*)    Hemoglobin 17.4 (*)    MCH 34.7 (*)    Neutro Abs 9.0 (*)    Monocytes Absolute 1.7 (*)    Eosinophils Absolute 0.7 (*)    Abs Immature Granulocytes 0.54 (*)    All other components within normal limits  COMPREHENSIVE METABOLIC PANEL - Abnormal; Notable for the following components:   Glucose, Bld 107 (*)    Calcium 8.7 (*)    Total Protein 6.1 (*)    ALT 60 (*)    All other components within normal limits  SARS CORONAVIRUS 2 BY RT PCR (HOSPITAL ORDER, [place] [facility])  BRAIN NATRIURETIC PEPTIDE  TROPONIN I (HIGH SENSITIVITY)  TROPONIN I (HIGH SENSITIVITY)    EKG None  Radiology CT Angio Chest PE W/Cm &/Or Wo Cm  Result Date: 02/29/2020 CLINICAL DATA:  Two days of increasing shortness of breath with nonproductive cough, known CLL EXAM: CT ANGIOGRAPHY CHEST WITH CONTRAST TECHNIQUE: Multidetector CT imaging of the chest was performed using the standard protocol during bolus administration of intravenous contrast. Multiplanar CT image reconstructions and MIPs were obtained to evaluate the vascular anatomy. CONTRAST:  133mL OMNIPAQUE IOHEXOL 350 MG/ML SOLN COMPARISON:  Radiograph 02/29/2020, CT 01/07/2017 FINDINGS: Cardiovascular: Satisfactory opacification the pulmonary arteries to the segmental level. No pulmonary artery filling defects are identified. Central pulmonary arteries mildly enlarged though similar to the comparison. Cardiac size at the upper limits of normal. Three-vessel coronary artery disease is present. Pericardial effusion. Suboptimal opacification of the thoracic aorta. No acute luminal abnormality is evident. No periaortic stranding or hemorrhage. Shared origin of the brachiocephalic and left common carotid artery. Major venous structures are unremarkable. Mediastinum/Nodes: No mediastinal fluid or gas. Normal thyroid gland  and thoracic inlet. No acute abnormality of the trachea or esophagus. No worrisome mediastinal, hilar or axillary adenopathy. Lungs/Pleura:  Diffuse moderate centrilobular and paraseptal emphysematous changes are present within the lungs. There are superimposed areas of interlobular septal thickening towards the apices and bases with some increasing hazy and ground-glass opacity in the dependent portions of the lungs which could reflect developing pulmonary edema and/or atelectasis. Redistributed pulmonary vascularity is present as well. No focal consolidative opacity is seen. Mild diffuse airways thickening and scattered secretions. Some bandlike areas of opacity predominantly in the lower lungs are likely reflective of subsegmental atelectasis and/or scarring. Respiratory motion artifact may limit detection of small pulmonary nodules. Node discernible worrisome pulmonary nodules or masses are identified. Upper Abdomen: No acute abnormalities present in the visualized portions of the upper abdomen. Musculoskeletal: Multilevel degenerative changes are present in the imaged portions of the spine. Additional degenerative changes in the shoulders. No chest wall mass or suspicious bone lesions identified. Review of the MIP images confirms the above findings. IMPRESSION: 1. No evidence of acute pulmonary artery filling defects to suggest pulmonary embolism. 2. Features of mild congestive heart failure with vascular redistribution, septal thickening towards the apices and bases and mixed hazy and ground-glass opacity in the dependent portions of the lungs suggestive of developing pulmonary edema and atelectasis. 3. Moderate centrilobular and paraseptal emphysema (Emphysema (ICD10-J43.9).) 4. Chronic bronchitic features present as well, likely accentuated with some peribronchovascular cuffing. 5. Aortic Atherosclerosis (ICD10-I70.0). 6. Three-vessel coronary artery calcifications are present. Please Martin that the presence  of coronary artery calcium documents the presence of coronary artery disease, the severity of this disease and any potential stenosis cannot be assessed on this non-gated CT examination. Electronically Signed   By: Lovena Le M.D.   On: 02/29/2020 06:20   DG Chest Portable 1 View  Result Date: 02/29/2020 CLINICAL DATA:  Dyspnea and increasing shortness of breath for the past 48 hours EXAM: PORTABLE CHEST 1 VIEW COMPARISON:  Radiograph 02/20/2020, CT 01/07/2017 FINDINGS: Background of chronic hyperinflation and emphysematous changes with chronically coarsened interstitium seen on comparison studies. There is some increasing mixed patchy and bandlike opacities in the lung bases which are favored to reflect atelectasis though underlying infection is difficult to exclude. The pulmonary vascularity remains fairly well-defined. No pneumothorax or effusion. Stable cardiomediastinal contours accounting for differences in technique. No acute osseous or soft tissue abnormality. Telemetry leads overlie the chest. IMPRESSION: 1. Increasing mixed patchy and bandlike opacities in the lung bases which are favored to reflect atelectasis though underlying infection is difficult to exclude. 2. Background of chronic emphysematous in coarsened interstitial changes. Electronically Signed   By: Lovena Le M.D.   On: 02/29/2020 03:57    Procedures Procedures (including critical care time)  Medications Ordered in ED Medications  albuterol (VENTOLIN HFA) 108 (90 Base) MCG/ACT inhaler 4 puff (4 puffs Inhalation Given 02/29/20 0334)  AeroChamber Plus Flo-Vu Medium MISC 1 each (1 each Other Given 02/29/20 0334)    ED Course  I have reviewed the triage vital signs and the nursing notes.  Pertinent labs & imaging results that were available during my care of the patient were reviewed by me and considered in my medical decision making (see chart for details).  Melvin Martin was evaluated in Emergency Department on 02/29/2020  for the symptoms described in the history of present illness. He/she was evaluated in the context of the global COVID-19 pandemic, which necessitated consideration that the patient might be at risk for infection with the SARS-CoV-2 virus that causes COVID-19. Institutional protocols and algorithms that pertain to the evaluation of patients at risk  for COVID-19 are in a state of rapid change based on information released by regulatory bodies including the CDC and federal and state organizations. These policies and algorithms were followed during the patient's care in the ED.    MDM Rules/Calculators/A&P                         Patient presents to the ED with complaints of dyspnea.  He is nontoxic, tachypneic at times, tachycardic, and requiring 2 L via nasal cannula to maintain SPO2 greater than 92%.  Lungs are without obvious adventitious sounds.  Ddx: Pulmonary embolism, COPD exacerbation, atypical ACS, pneumonia, COVID-19, symptomatic anemia, CHF.  Additional history obtained:  Additional history obtained from chart review & EMS. . Previous records obtained and reviewed.  EKG: Tachycardia, no STEMI.  Lab Tests:  I Ordered, reviewed, and interpreted labs, which included:  CBC: Mild leukocytosis, mildly elevated hemoglobin. CMP: Mild hypocalcemia, no significant electrolyte derangement.  Renal function preserved. Troponin: 5 COVID: Negative  Imaging Studies ordered:  I ordered imaging studies which included CXR & CTA of the chest, I independently visualized and interpreted imaging which showed:  CXR: 1. Increasing mixed patchy and bandlike opacities in the lung bases which are favored to reflect atelectasis though underlying infection is difficult to exclude. 2. Background of chronic emphysematous in coarsened interstitial changes. CT angio chest:  1. No evidence of acute pulmonary artery filling defects to suggest pulmonary embolism. 2. Features of mild congestive heart failure with vascular  redistribution, septal thickening towards the apices and bases and mixed hazy and ground-glass opacity in the dependent portions of the lungs suggestive of developing pulmonary edema and atelectasis. 3. Moderate centrilobular and paraseptal emphysema (Emphysema (ICD10-J43.9).) 4. Chronic bronchitic features present as well, likely accentuated with some peribronchovascular cuffing. 5. Aortic Atherosclerosis (ICD10-I70.0). 6. Three-vessel coronary artery calcifications are present. Please Martin that the presence of coronary artery calcium documents the presence of coronary artery disease, the severity of this disease and any potential stenosis cannot be assessed on this non-gated CT examination.  ED Course:  No significant wheezing noted, however following initial assessment albuterol inhaler with spacer was ordered for trial of symptomatic tx.   06:30: RE-EVAL: Patient without much change S/p albuterol, remains without wheezing, does not seem classic for COPD exacerbation. Troponins are flat therefore doubt ACS. Labs overall reassuring. CT angio without evidence of pulmonary embolism, however does show features of mild congestive heart failure, will add on BNP & give 1 x dose of Lasix, feel patient would benefit from echocardiogram for further evaluation.  Will consult hospitalist service for admission given his acute hypoxic respiratory failure, currently requiring 4L via [place]. Patient is in agreement.   07:58: CONSULT: Discussed with hospitalist Dr. Lonny Prude, accepts admission, requesting solumedrol be ordered as well which I am in agreement with.   Portions of this Martin were generated with [employer]. Dictation errors may occur despite best attempts at proofreading.  Final Clinical Impression(s) / ED Diagnoses Final diagnoses:  Acute hypoxemic respiratory failure [facility])    Rx / DC Orders ED Discharge Orders    None       Amaryllis Dyke, PA-C 02/29/20 0759    Merrily Pew, MD 02/29/20 2300

## 2020-02-29 NOTE — Progress Notes (Signed)
  Echocardiogram 2D Echocardiogram has been performed.  Randa Lynn Lem Peary 02/29/2020, 10:23 AM

## 2020-02-29 NOTE — ED Triage Notes (Signed)
Pt presents from home reports SOB worsening over the last 2 days, recently finished steroids, room air sat's 87%, not usually on O2, reports non-productive cough, denies pain, hx emphysema and CLL not currently on chemo

## 2020-02-29 NOTE — Progress Notes (Signed)
Reported received from [employer].

## 2020-03-01 LAB — BASIC METABOLIC PANEL
Anion gap: 10 (ref 5–15)
BUN: 27 mg/dL — ABNORMAL HIGH (ref 8–23)
CO2: 24 mmol/L (ref 22–32)
Calcium: 8.4 mg/dL — ABNORMAL LOW (ref 8.9–10.3)
Chloride: 100 mmol/L (ref 98–111)
Creatinine, Ser: 1.2 mg/dL (ref 0.61–1.24)
GFR calc Af Amer: >60 mL/min (ref >60)
GFR calc non Af Amer: >60 mL/min (ref >60)
Glucose, Bld: 201 mg/dL — ABNORMAL HIGH (ref 70–99)
Potassium: 4.7 mmol/L (ref 3.5–5.1)
Sodium: 134 mmol/L — ABNORMAL LOW (ref 135–145)

## 2020-03-01 LAB — CBC
HCT: 49 % (ref 39.0–52.0)
Hemoglobin: 17.1 g/dL — ABNORMAL HIGH (ref 13.0–17.0)
MCH: 34.8 pg — ABNORMAL HIGH (ref 26.0–34.0)
MCHC: 34.9 g/dL (ref 30.0–36.0)
MCV: 99.8 fL (ref 80.0–100.0)
Platelets: 191 10*3/uL (ref 150–400)
RBC: 4.91 MIL/uL (ref 4.22–5.81)
RDW: 12.9 % (ref 11.5–15.5)
WBC: 18.8 10*3/uL — ABNORMAL HIGH (ref 4.0–10.5)
nRBC: 0 % (ref 0.0–0.2)

## 2020-03-01 LAB — HIV ANTIBODY (ROUTINE TESTING W REFLEX): HIV Screen 4th Generation wRfx: NONREACTIVE

## 2020-03-01 MED ORDER — IPRATROPIUM-ALBUTEROL 0.5-2.5 (3) MG/3ML IN SOLN
3.0000 mL | Freq: Three times a day (TID) | RESPIRATORY_TRACT | Status: DC
  Administered 2020-03-01 – 2020-03-02 (×4): 3 mL via RESPIRATORY_TRACT
  Filled 2020-03-01 (×4): qty 3

## 2020-03-01 MED ORDER — FUROSEMIDE 10 MG/ML IJ SOLN
20.0000 mg | Freq: Once | INTRAMUSCULAR | Status: AC
  Administered 2020-03-01: 20 mg via INTRAVENOUS
  Filled 2020-03-01: qty 2

## 2020-03-01 MED ORDER — SODIUM CHLORIDE 0.9 % IV SOLN
100.0000 mg | Freq: Two times a day (BID) | INTRAVENOUS | Status: DC
  Administered 2020-03-01 – 2020-03-02 (×3): 100 mg via INTRAVENOUS
  Filled 2020-03-01 (×4): qty 100

## 2020-03-01 NOTE — Progress Notes (Signed)
[facility] PROGRESS NOTE   Melvin Martin ZMO:[id number] DOB: [birth year] DOA: 02/29/2020  PCP: Marton Redwood, MD  Brief History/Interval Summary: 64 y.o. male with medical history significant for CLL, CAD status post 2 stents (08/2012), asthma, centrilobular emphysema (never smoker) on no oxygen who presented on 02/29/2020 with worsening shortness of breath and nonproductive cough for a week that did not improved on his home inhalers.  He was started on prednisone taper after urgent care evaluation of 02/20/2020 for presumed chest congestion given stable chest x-ray at that time.  He did not notice any improvement on his own Combivent/Stiolto inhalers.  He noted some slight improvement in his cough on the steroids but continued to have worsening shortness of breath even at rest.  He noted fever 101.4 at home.  Denies any PND, no orthopnea, no lower leg swelling, no chest pain, no recent sick contacts, no nausea or vomiting.  Patient underwent CT scan of chest which did not show any PE.  There was some concern for pulmonary edema and atelectasis.  No infection was noted either.  Patient was hospitalized due to new oxygen requirements.  Reason for Visit: Acute respiratory failure with hypoxia in the setting of emphysema  Consultants: None  Procedures: None  Antibiotics: Anti-infectives (From admission, onward)   Start     Dose/Rate Route Frequency Ordered Stop   03/01/20 1000  doxycycline (VIBRAMYCIN) 100 mg in sodium chloride 0.9 % 250 mL IVPB     Discontinue     100 mg 125 mL/hr over 120 Minutes Intravenous Every 12 hours 03/01/20 0904     02/29/20 1000  acyclovir (ZOVIRAX) tablet 400 mg     Discontinue     400 mg Oral 2 times daily 02/29/20 0831        Subjective/Interval History: Mentions that he is feeling slightly better today compared to yesterday.  Yesterday he was getting quite dyspneic even with minimal exertion.  Today he was able to walk in his room without getting too  short of breath.  Denies any chest pain.  He did urinate a lot after getting Lasix yesterday.  He is followed by pulmonology at [facility].  ROS: Denies any nausea or vomiting    Assessment/Plan:  Acute respiratory failure with hypoxia Patient usually does not use oxygen at home.  He does have a history of emphysema.  CT scan did not show any PE.  There was some concern for pulmonary edema.  Echocardiogram does not show any systolic or diastolic dysfunction.  No significant valvular abnormalities noted.  Patient has improved with nebulizer treatments.  He also diuresed quite a bit after he was given Lasix.  We will repeat another dose of Lasix today.  Continue with steroids and nebulizer treatments.  Will add antibacterial for possible pulmonary inflammation related to his emphysema and chronic bronchitis.  Wean down oxygen as tolerated.  Concern for pulmonary edema Echocardiogram without any systolic or diastolic dysfunction.  Lungs did not reveal any definite crackles today.  No lower extremity edema.  Another dose of Lasix today.  Do not anticipate he will need any further cardiac work-up at this time.  Centrilobular emphysema with likely acute exacerbation The discussion above.  Patient is followed by pulmonology at [facility].  Will benefit from follow-up in the next few weeks.  He has an appointment with them in August.  History of CLL Currently not on any antineoplastic treatment.  Follow-up with oncology as outpatient.  He is on acyclovir  for prophylaxis.  Elevated WBC today is most likely due to steroids.  History of coronary artery disease with history of non-STEMI status post PCI and December 2013 Stable.  Continue aspirin.  LVH noted on echocardiogram.  History of GERD Continue PPI  Hyperlipidemia Continue statin.  History of BPH Continue Flomax.   DVT Prophylaxis: Subcutaneous heparin Code Status: Full code Family Communication: Discussed with the  patient Disposition Plan:  Status is: Inpatient  Remains inpatient appropriate because:IV treatments appropriate due to intensity of illness or inability to take PO and Inpatient level of care appropriate due to severity of illness   Dispo: The patient is from: Home              Anticipated d/c is to: Home              Anticipated d/c date is: 2 days              Patient currently is not medically stable to d/c.      Medications:  Scheduled: . acyclovir  400 mg Oral BID  . aspirin  81 mg Oral Daily  . azelastine  1 spray Each Nare BID  . dextromethorphan-guaiFENesin  1 tablet Oral BID  . heparin  5,000 Units Subcutaneous Q8H  . ipratropium-albuterol  3 mL Nebulization TID  . methylPREDNISolone (SOLU-MEDROL) injection  40 mg Intravenous Q6H  . mirabegron ER  50 mg Oral Daily  . pantoprazole  40 mg Oral Daily  . rosuvastatin  10 mg Oral Daily  . tamsulosin  0.4 mg Oral QHS   Continuous: . doxycycline (VIBRAMYCIN) IV 100 mg (03/01/20 1004)   BWI:OMBTDHRCBULAG **OR** acetaminophen, albuterol, loratadine, nitroGLYCERIN, ondansetron **OR** ondansetron (ZOFRAN) IV, senna-docusate, sodium chloride   Objective:  Vital Signs  Vitals:   02/29/20 2114 03/01/20 0455 03/01/20 0455 03/01/20 0832  BP: 134/79 136/73 136/73   Pulse: 88 95 95   Resp: 16 16 16    Temp: 97.7 F (36.5 C) 97.6 F (36.4 C) 97.6 F (36.4 C)   TempSrc: Oral Oral Oral   SpO2: 92% 90% 90% 92%  Weight:      Height:        Intake/Output Summary (Last 24 hours) at 03/01/2020 1141 Last data filed at 03/01/2020 0900 Gross per 24 hour  Intake 240 ml  Output 725 ml  Net -485 ml   Filed Weights   02/29/20 0303  Weight: 77.1 kg    General appearance: Awake alert.  In no distress Resp: Normal effort at rest.  Coarse breath sounds bilaterally.  No definite wheezing or rhonchi.  Few crackles at the bases bilaterally. Cardio: S1-S2 is normal regular.  No S3-S4.  No rubs murmurs or bruit GI: Abdomen is soft.   Nontender nondistended.  Bowel sounds are present normal.  No masses organomegaly Extremities: No edema.  Full range of motion of lower extremities. Neurologic: Alert and oriented x3.  No focal neurological deficits.    Lab Results:  Data Reviewed: I have personally reviewed following labs and imaging studies  CBC: Recent Labs  Lab 02/29/20 0327 02/29/20 1345 03/01/20 0532  WBC 13.3* 13.1* 18.8*  NEUTROABS 9.0*  --   --   HGB 17.4* 17.5* 17.1*  HCT 49.8 50.3 49.0  MCV 99.2 100.0 99.8  PLT 185 182 536    Basic Metabolic Panel: Recent Labs  Lab 02/29/20 0327 03/01/20 0532  NA 135 134*  K 4.3 4.7  CL 102 100  CO2 25 24  GLUCOSE 107* 201*  BUN 18 27*  CREATININE 1.03 1.20  CALCIUM 8.7* 8.4*    GFR: Estimated Creatinine Clearance: 66.7 mL/min (by C-G formula based on SCr of 1.2 mg/dL).  Liver Function Tests: Recent Labs  Lab 02/29/20 0327  AST 27  ALT 60*  ALKPHOS 73  BILITOT 0.7  PROT 6.1*  ALBUMIN 3.9    Thyroid Function Tests: Recent Labs    02/29/20 1345  TSH 1.065      Recent Results (from the past 240 hour(s))  SARS Coronavirus 2 by RT PCR (hospital order, performed in [facility] hospital lab) Nasopharyngeal Nasopharyngeal Swab     Status: None   Collection Time: 02/29/20  3:27 AM   Specimen: Nasopharyngeal Swab  Result Value Ref Range Status   SARS Coronavirus 2 NEGATIVE NEGATIVE Final    Comment: (NOTE) SARS-CoV-2 target nucleic acids are NOT DETECTED.  The SARS-CoV-2 RNA is generally detectable in upper and lower respiratory specimens during the acute phase of infection. The lowest concentration of SARS-CoV-2 viral copies this assay can detect is 250 copies / mL. A negative result does not preclude SARS-CoV-2 infection and should not be used as the sole basis for treatment or other patient management decisions.  A negative result may occur with improper specimen collection / handling, submission of specimen other than nasopharyngeal  swab, presence of viral mutation(s) within the areas targeted by this assay, and inadequate number of viral copies (<250 copies / mL). A negative result must be combined with clinical observations, patient history, and epidemiological information.  Fact Sheet for Patients:   StrictlyIdeas.no  Fact Sheet for Healthcare Providers: BankingDealers.co.za  This test is not yet approved or  cleared by the [place] FDA and has been authorized for detection and/or diagnosis of SARS-CoV-2 by FDA under an Emergency Use Authorization (EUA).  This EUA will remain in effect (meaning this test can be used) for the duration of the COVID-19 declaration under Section 564(b)(1) of the Act, 21 U.S.C. section 360bbb-3(b)(1), unless the authorization is terminated or revoked sooner.  Performed at [facility], [place] [address] [place], [place] [address]       Radiology Studies: CT Angio Chest PE W/Cm &/Or Wo Cm  Result Date: 02/29/2020 CLINICAL DATA:  Two days of increasing shortness of breath with nonproductive cough, known CLL EXAM: CT ANGIOGRAPHY CHEST WITH CONTRAST TECHNIQUE: Multidetector CT imaging of the chest was performed using the standard protocol during bolus administration of intravenous contrast. Multiplanar CT image reconstructions and MIPs were obtained to evaluate the vascular anatomy. CONTRAST:  140mL OMNIPAQUE IOHEXOL 350 MG/ML SOLN COMPARISON:  Radiograph 02/29/2020, CT 01/07/2017 FINDINGS: Cardiovascular: Satisfactory opacification the pulmonary arteries to the segmental level. No pulmonary artery filling defects are identified. Central pulmonary arteries mildly enlarged though similar to the comparison. Cardiac size at the upper limits of normal. Three-vessel coronary artery disease is present. Pericardial effusion. Suboptimal opacification of the thoracic aorta. No acute luminal abnormality is evident. No periaortic  stranding or hemorrhage. Shared origin of the brachiocephalic and left common carotid artery. Major venous structures are unremarkable. Mediastinum/Nodes: No mediastinal fluid or gas. Normal thyroid gland and thoracic inlet. No acute abnormality of the trachea or esophagus. No worrisome mediastinal, hilar or axillary adenopathy. Lungs/Pleura: Diffuse moderate centrilobular and paraseptal emphysematous changes are present within the lungs. There are superimposed areas of interlobular septal thickening towards the apices and bases with some increasing hazy and ground-glass opacity in the dependent portions of the lungs which could reflect developing pulmonary edema and/or  atelectasis. Redistributed pulmonary vascularity is present as well. No focal consolidative opacity is seen. Mild diffuse airways thickening and scattered secretions. Some bandlike areas of opacity predominantly in the lower lungs are likely reflective of subsegmental atelectasis and/or scarring. Respiratory motion artifact may limit detection of small pulmonary nodules. Node discernible worrisome pulmonary nodules or masses are identified. Upper Abdomen: No acute abnormalities present in the visualized portions of the upper abdomen. Musculoskeletal: Multilevel degenerative changes are present in the imaged portions of the spine. Additional degenerative changes in the shoulders. No chest wall mass or suspicious bone lesions identified. Review of the MIP images confirms the above findings. IMPRESSION: 1. No evidence of acute pulmonary artery filling defects to suggest pulmonary embolism. 2. Features of mild congestive heart failure with vascular redistribution, septal thickening towards the apices and bases and mixed hazy and ground-glass opacity in the dependent portions of the lungs suggestive of developing pulmonary edema and atelectasis. 3. Moderate centrilobular and paraseptal emphysema (Emphysema (ICD10-J43.9).) 4. Chronic bronchitic features  present as well, likely accentuated with some peribronchovascular cuffing. 5. Aortic Atherosclerosis (ICD10-I70.0). 6. Three-vessel coronary artery calcifications are present. Please note that the presence of coronary artery calcium documents the presence of coronary artery disease, the severity of this disease and any potential stenosis cannot be assessed on this non-gated CT examination. Electronically Signed   By: Lovena Le M.D.   On: 02/29/2020 06:20   DG Chest Portable 1 View  Result Date: 02/29/2020 CLINICAL DATA:  Dyspnea and increasing shortness of breath for the past 48 hours EXAM: PORTABLE CHEST 1 VIEW COMPARISON:  Radiograph 02/20/2020, CT 01/07/2017 FINDINGS: Background of chronic hyperinflation and emphysematous changes with chronically coarsened interstitium seen on comparison studies. There is some increasing mixed patchy and bandlike opacities in the lung bases which are favored to reflect atelectasis though underlying infection is difficult to exclude. The pulmonary vascularity remains fairly well-defined. No pneumothorax or effusion. Stable cardiomediastinal contours accounting for differences in technique. No acute osseous or soft tissue abnormality. Telemetry leads overlie the chest. IMPRESSION: 1. Increasing mixed patchy and bandlike opacities in the lung bases which are favored to reflect atelectasis though underlying infection is difficult to exclude. 2. Background of chronic emphysematous in coarsened interstitial changes. Electronically Signed   By: Lovena Le M.D.   On: 02/29/2020 03:57   ECHOCARDIOGRAM COMPLETE  Result Date: 02/29/2020    ECHOCARDIOGRAM REPORT   Patient Name:   ERIQ HUFFORD Date of Exam: 02/29/2020 Medical Rec #:  [id number]     Height:       70.0 in Accession #:    [id number]    Weight:       170.0 lb Date of Birth:  1955-10-09     BSA:          1.948 m Patient Age:    40 years      BP:           136/96 mmHg Patient Gender: M             HR:           95 bpm.  Exam Location:  Inpatient Procedure: 2D Echo, Cardiac Doppler and Color Doppler Indications:    Pulmonary Edema [id number]  History:        Patient has no prior history of Echocardiogram examinations.                 Previous Myocardial Infarction and CAD, COPD; Risk  Factors:Dyslipidemia. Cancer. GERD.  Sonographer:    Tiffany Dance Referring Phys: [id number] [place]  1. Left ventricular ejection fraction, by estimation, is 55 to 60%. The left ventricle has normal function. The left ventricle has no regional wall motion abnormalities. There is moderate left ventricular hypertrophy. Left ventricular diastolic parameters were normal.  2. Right ventricular systolic function is normal. The right ventricular size is normal. Tricuspid regurgitation signal is inadequate for assessing PA pressure.  3. The mitral valve is normal in structure. No evidence of mitral valve regurgitation.  4. The aortic valve is tricuspid. Aortic valve regurgitation is not visualized. No aortic stenosis is present.  5. The inferior vena cava is normal in size with greater than 50% respiratory variability, suggesting right atrial pressure of 3 mmHg. FINDINGS  Left Ventricle: Left ventricular ejection fraction, by estimation, is 55 to 60%. The left ventricle has normal function. The left ventricle has no regional wall motion abnormalities. The left ventricular internal cavity size was small. There is moderate  left ventricular hypertrophy. Left ventricular diastolic parameters were normal. Right Ventricle: The right ventricular size is normal. Right vetricular wall thickness was not assessed. Right ventricular systolic function is normal. Tricuspid regurgitation signal is inadequate for assessing PA pressure. Left Atrium: Left atrial size was normal in size. Right Atrium: Right atrial size was normal in size. Pericardium: Trivial pericardial effusion is present. Mitral Valve: The mitral valve is normal in structure. No  evidence of mitral valve regurgitation. Tricuspid Valve: The tricuspid valve is normal in structure. Tricuspid valve regurgitation is trivial. Aortic Valve: The aortic valve is tricuspid. Aortic valve regurgitation is not visualized. No aortic stenosis is present. Pulmonic Valve: The pulmonic valve was not well visualized. Pulmonic valve regurgitation is not visualized. Aorta: The aortic root and ascending aorta are structurally normal, with no evidence of dilitation. Venous: The inferior vena cava is normal in size with greater than 50% respiratory variability, suggesting right atrial pressure of 3 mmHg. IAS/Shunts: The interatrial septum was not well visualized.  LEFT VENTRICLE PLAX 2D LVIDd:         3.50 cm  Diastology LVIDs:         2.40 cm  LV e' lateral:   6.96 cm/s LV PW:         1.40 cm  LV E/e' lateral: 8.1 LV IVS:        1.10 cm  LV e' medial:    7.72 cm/s LVOT diam:     2.00 cm  LV E/e' medial:  7.3 LV SV:         37 LV SV Index:   19 LVOT Area:     3.14 cm  RIGHT VENTRICLE             IVC RV Basal diam:  2.80 cm     IVC diam: 1.40 cm RV S prime:     14.70 cm/s TAPSE (M-mode): 2.2 cm LEFT ATRIUM             Index       RIGHT ATRIUM           Index LA diam:        3.80 cm 1.95 cm/m  RA Area:     11.70 cm LA Vol (A2C):   44.6 ml 22.90 ml/m RA Volume:   25.50 ml  13.09 ml/m LA Vol (A4C):   33.3 ml 17.09 ml/m LA Biplane Vol: 39.2 ml 20.12 ml/m  AORTIC VALVE LVOT Vmax:  72.10 cm/s LVOT Vmean:  47.800 cm/s LVOT VTI:    0.117 m  AORTA Ao Root diam: 3.40 cm Ao Asc diam:  3.30 cm MITRAL VALVE MV Area (PHT): 3.77 cm    SHUNTS MV Decel Time: 201 msec    Systemic VTI:  0.12 m MV E velocity: 56.20 cm/s  Systemic Diam: 2.00 cm MV A velocity: 78.50 cm/s MV E/A ratio:  0.72 Oswaldo Milian MD Electronically signed by Oswaldo Milian MD Signature Date/Time: 02/29/2020/2:46:49 PM    Final        LOS: 1 day   [facility] Pager on [email or web address]  03/01/2020, 11:41 AM

## 2020-03-02 LAB — BASIC METABOLIC PANEL
Anion gap: 8 (ref 5–15)
BUN: 26 mg/dL — ABNORMAL HIGH (ref 8–23)
CO2: 26 mmol/L (ref 22–32)
Calcium: 8.4 mg/dL — ABNORMAL LOW (ref 8.9–10.3)
Chloride: 103 mmol/L (ref 98–111)
Creatinine, Ser: 0.96 mg/dL (ref 0.61–1.24)
GFR calc Af Amer: >60 mL/min (ref >60)
GFR calc non Af Amer: >60 mL/min (ref >60)
Glucose, Bld: 141 mg/dL — ABNORMAL HIGH (ref 70–99)
Potassium: 4.6 mmol/L (ref 3.5–5.1)
Sodium: 137 mmol/L (ref 135–145)

## 2020-03-02 LAB — CBC
HCT: 45.1 % (ref 39.0–52.0)
Hemoglobin: 15.9 g/dL (ref 13.0–17.0)
MCH: 36.1 pg — ABNORMAL HIGH (ref 26.0–34.0)
MCHC: 35.3 g/dL (ref 30.0–36.0)
MCV: 102.3 fL — ABNORMAL HIGH (ref 80.0–100.0)
Platelets: 188 10*3/uL (ref 150–400)
RBC: 4.41 MIL/uL (ref 4.22–5.81)
RDW: 12.9 % (ref 11.5–15.5)
WBC: 27.5 10*3/uL — ABNORMAL HIGH (ref 4.0–10.5)
nRBC: 0 % (ref 0.0–0.2)

## 2020-03-02 MED ORDER — PREDNISONE 20 MG PO TABS
ORAL_TABLET | ORAL | 0 refills | Status: DC
Start: 2020-03-02 — End: 2020-10-04

## 2020-03-02 MED ORDER — SALINE SPRAY 0.65 % NA SOLN: 1.0000 | NASAL | 0 refills | Status: DC | PRN

## 2020-03-02 MED ORDER — OXYMETAZOLINE HCL 0.05 % NA SOLN
2.0000 | Freq: Two times a day (BID) | NASAL | Status: DC
  Filled 2020-03-02: qty 15

## 2020-03-02 MED ORDER — OXYMETAZOLINE HCL 0.05 % NA SOLN: 2.0000 | Freq: Two times a day (BID) | NASAL | 0 refills | Status: DC | PRN

## 2020-03-02 MED ORDER — DOXYCYCLINE MONOHYDRATE 100 MG PO TABS: 100.0000 mg | ORAL_TABLET | Freq: Two times a day (BID) | ORAL | 0 refills | Status: AC

## 2020-03-02 MED ORDER — POTASSIUM CHLORIDE ER 10 MEQ PO TBCR: 10.0000 meq | EXTENDED_RELEASE_TABLET | Freq: Every day | ORAL | 0 refills | Status: DC

## 2020-03-02 MED ORDER — FUROSEMIDE 20 MG PO TABS
20.0000 mg | ORAL_TABLET | Freq: Every day | ORAL | 1 refills | Status: DC
Start: 2020-03-02 — End: 2022-05-04

## 2020-03-02 NOTE — Progress Notes (Signed)
SATURATION QUALIFICATIONS: (This note is used to comply with regulatory documentation for home oxygen)  Patient Saturations on Room Air at Rest =91 Patient Saturations on Room Air while Ambulating =86  Patient Saturations on 3Liters of oxygen while Ambulating = 90  Please briefly explain why patient needs home oxygen: 

## 2020-03-02 NOTE — Discharge Summary (Signed)
[facility]  Physician Discharge Summary   Patient ID: Melvin Martin MRN: [id number] DOB/AGE: [birth year] 64 y.o.  Admit date: 02/29/2020 Discharge date: 03/02/2020  PCP: Marton Redwood, MD  DISCHARGE DIAGNOSES:  Acute respiratory failure with hypoxia Centrilobular emphysema with acute exacerbation History of CLL History of coronary artery disease GERD Hyperlipidemia BPH  RECOMMENDATIONS FOR OUTPATIENT FOLLOW UP: 1. Home oxygen has been ordered 2. Patient will need follow-up with his pulmonologist at [facility] in the next few weeks.    Home Health: None Equipment/Devices: Oxygen  CODE STATUS: Full code  DISCHARGE CONDITION: fair  Diet recommendation: As before  INITIAL HISTORY: 64 y.o.malewith medical history significant forCLL, CADstatus post 2 stents (08/2012),asthma, centrilobular emphysema(never smoker)on no oxygenwho presented on 6/24/2021with worsening shortness of breath and nonproductive cough for a week that did not improved on his home inhalers. He was started on prednisone taper after urgent care evaluation of 02/20/2020 for presumed chest congestion given stable chest x-ray at that time. He did not notice any improvement on his own Combivent/Stiolto inhalers. He noted some slight improvement in his cough on the steroids but continued to have worsening shortness of breath even at rest. He noted fever 101.4 at home. Denies any PND, no orthopnea, no lower leg swelling, no chest pain, no recent sick contacts, no nausea or vomiting.  Patient underwent CT scan of chest which did not show any PE.  There was some concern for pulmonary edema and atelectasis.  No infection was noted either.  Patient was hospitalized due to new oxygen requirements.   HOSPITAL COURSE:   Acute respiratory failure with hypoxia Patient usually does not use oxygen at home.  He does have a history of emphysema.  CT scan did not show any PE.  There was some concern for pulmonary  edema.  Echocardiogram does not show any systolic or diastolic dysfunction.  No significant valvular abnormalities noted.  Patient has improved with nebulizer treatments.  He also diuresed quite a bit after he was given Lasix.   He was started on doxycycline.  Patient started feeling better.  Ambulated today and tends to desaturate into the 80s.  He will need home oxygen.   Patient very adamant about going home today.  He states that he is feeling better.  He wants to be with his daughter. Patient promises to follow-up with his pulmonologist.  We will send the pulmonologist at [facility] a copy of the summary.    Concern for pulmonary edema Echocardiogram without any systolic or diastolic dysfunction.  Lungs did not reveal any definite crackles today.  No lower extremity edema.    Will be discharged on low-dose furosemide.  Further management per his providers at [facility].    Centrilobular emphysema with likely acute exacerbation See discussion above.  Patient is followed by pulmonology at [facility].  Will benefit from follow-up in the next few weeks.  He has an appointment with them in August.  Followed by Dr. Bridgett Larsson.  History of CLL Currently not on any antineoplastic treatment.  Follow-up with oncology as outpatient.  He is on acyclovir for prophylaxis.  Elevated WBC here in the hospital is most likely due to steroids.  History of coronary artery disease with history of non-STEMI status post PCI and December 2013 Stable.  Continue aspirin.  LVH noted on echocardiogram.  History of GERD Continue PPI  Hyperlipidemia Continue statin.  History of BPH Continue Flomax.  Overall stable.  Patient very keen and quite adamant about going home today.  Okay for  discharge with home oxygen.   PERTINENT LABS:  The results of significant diagnostics from this hospitalization (including imaging, microbiology, ancillary and laboratory) are listed below for reference.    Microbiology: Recent Results (from the  past 240 hour(s))  SARS Coronavirus 2 by RT PCR (hospital order, performed in [facility] hospital lab) Nasopharyngeal Nasopharyngeal Swab     Status: None   Collection Time: 02/29/20  3:27 AM   Specimen: Nasopharyngeal Swab  Result Value Ref Range Status   SARS Coronavirus 2 NEGATIVE NEGATIVE Final    Comment: (NOTE) SARS-CoV-2 target nucleic acids are NOT DETECTED.  The SARS-CoV-2 RNA is generally detectable in upper and lower respiratory specimens during the acute phase of infection. The lowest concentration of SARS-CoV-2 viral copies this assay can detect is 250 copies / mL. A negative result does not preclude SARS-CoV-2 infection and should not be used as the sole basis for treatment or other patient management decisions.  A negative result may occur with improper specimen collection / handling, submission of specimen other than nasopharyngeal swab, presence of viral mutation(s) within the areas targeted by this assay, and inadequate number of viral copies (<250 copies / mL). A negative result must be combined with clinical observations, patient history, and epidemiological information.  Fact Sheet for Patients:   StrictlyIdeas.no  Fact Sheet for Healthcare Providers: BankingDealers.co.za  This test is not yet approved or  cleared by the [place] FDA and has been authorized for detection and/or diagnosis of SARS-CoV-2 by FDA under an Emergency Use Authorization (EUA).  This EUA will remain in effect (meaning this test can be used) for the duration of the COVID-19 declaration under Section 564(b)(1) of the Act, 21 U.S.C. section 360bbb-3(b)(1), unless the authorization is terminated or revoked sooner.  Performed at [facility], [place] [address] [place], [place] [address]      Labs:  COVID-19 Labs   Lab Results  Component Value Date   [place] NEGATIVE 02/29/2020      Basic Metabolic  Panel: Recent Labs  Lab 02/29/20 0327 03/01/20 0532 03/02/20 0552  NA 135 134* 137  K 4.3 4.7 4.6  CL 102 100 103  CO2 25 24 26   GLUCOSE 107* 201* 141*  BUN 18 27* 26*  CREATININE 1.03 1.20 0.96  CALCIUM 8.7* 8.4* 8.4*   Liver Function Tests: Recent Labs  Lab 02/29/20 0327  AST 27  ALT 60*  ALKPHOS 73  BILITOT 0.7  PROT 6.1*  ALBUMIN 3.9   CBC: Recent Labs  Lab 02/29/20 0327 02/29/20 1345 03/01/20 0532 03/02/20 0552  WBC 13.3* 13.1* 18.8* 27.5*  NEUTROABS 9.0*  --   --   --   HGB 17.4* 17.5* 17.1* 15.9  HCT 49.8 50.3 49.0 45.1  MCV 99.2 100.0 99.8 102.3*  PLT 185 182 191 188   BNP: BNP (last 3 results) Recent Labs    02/29/20 0632  BNP 19.0     IMAGING STUDIES DG Chest 2 View  Result Date: 02/20/2020 CLINICAL DATA:  COPD, cough, dyspnea on exertion EXAM: CHEST - 2 VIEW COMPARISON:  03/25/2018 FINDINGS: Frontal and lateral views of the chest demonstrate an unremarkable cardiac silhouette. The lungs are hyperinflated, with background scarring consistent with emphysema. No airspace disease, effusion, or pneumothorax. IMPRESSION: 1. Stable emphysema.  No acute process. Electronically Signed   By: Randa Ngo M.D.   On: 02/20/2020 16:49   CT Angio Chest PE W/Cm &/Or Wo Cm  Result Date: 02/29/2020 CLINICAL DATA:  Two days  of increasing shortness of breath with nonproductive cough, known CLL EXAM: CT ANGIOGRAPHY CHEST WITH CONTRAST TECHNIQUE: Multidetector CT imaging of the chest was performed using the standard protocol during bolus administration of intravenous contrast. Multiplanar CT image reconstructions and MIPs were obtained to evaluate the vascular anatomy. CONTRAST:  157mL OMNIPAQUE IOHEXOL 350 MG/ML SOLN COMPARISON:  Radiograph 02/29/2020, CT 01/07/2017 FINDINGS: Cardiovascular: Satisfactory opacification the pulmonary arteries to the segmental level. No pulmonary artery filling defects are identified. Central pulmonary arteries mildly enlarged though  similar to the comparison. Cardiac size at the upper limits of normal. Three-vessel coronary artery disease is present. Pericardial effusion. Suboptimal opacification of the thoracic aorta. No acute luminal abnormality is evident. No periaortic stranding or hemorrhage. Shared origin of the brachiocephalic and left common carotid artery. Major venous structures are unremarkable. Mediastinum/Nodes: No mediastinal fluid or gas. Normal thyroid gland and thoracic inlet. No acute abnormality of the trachea or esophagus. No worrisome mediastinal, hilar or axillary adenopathy. Lungs/Pleura: Diffuse moderate centrilobular and paraseptal emphysematous changes are present within the lungs. There are superimposed areas of interlobular septal thickening towards the apices and bases with some increasing hazy and ground-glass opacity in the dependent portions of the lungs which could reflect developing pulmonary edema and/or atelectasis. Redistributed pulmonary vascularity is present as well. No focal consolidative opacity is seen. Mild diffuse airways thickening and scattered secretions. Some bandlike areas of opacity predominantly in the lower lungs are likely reflective of subsegmental atelectasis and/or scarring. Respiratory motion artifact may limit detection of small pulmonary nodules. Node discernible worrisome pulmonary nodules or masses are identified. Upper Abdomen: No acute abnormalities present in the visualized portions of the upper abdomen. Musculoskeletal: Multilevel degenerative changes are present in the imaged portions of the spine. Additional degenerative changes in the shoulders. No chest wall mass or suspicious bone lesions identified. Review of the MIP images confirms the above findings. IMPRESSION: 1. No evidence of acute pulmonary artery filling defects to suggest pulmonary embolism. 2. Features of mild congestive heart failure with vascular redistribution, septal thickening towards the apices and bases and  mixed hazy and ground-glass opacity in the dependent portions of the lungs suggestive of developing pulmonary edema and atelectasis. 3. Moderate centrilobular and paraseptal emphysema (Emphysema (ICD10-J43.9).) 4. Chronic bronchitic features present as well, likely accentuated with some peribronchovascular cuffing. 5. Aortic Atherosclerosis (ICD10-I70.0). 6. Three-vessel coronary artery calcifications are present. Please note that the presence of coronary artery calcium documents the presence of coronary artery disease, the severity of this disease and any potential stenosis cannot be assessed on this non-gated CT examination. Electronically Signed   By: Lovena Le M.D.   On: 02/29/2020 06:20   DG Chest Portable 1 View  Result Date: 02/29/2020 CLINICAL DATA:  Dyspnea and increasing shortness of breath for the past 48 hours EXAM: PORTABLE CHEST 1 VIEW COMPARISON:  Radiograph 02/20/2020, CT 01/07/2017 FINDINGS: Background of chronic hyperinflation and emphysematous changes with chronically coarsened interstitium seen on comparison studies. There is some increasing mixed patchy and bandlike opacities in the lung bases which are favored to reflect atelectasis though underlying infection is difficult to exclude. The pulmonary vascularity remains fairly well-defined. No pneumothorax or effusion. Stable cardiomediastinal contours accounting for differences in technique. No acute osseous or soft tissue abnormality. Telemetry leads overlie the chest. IMPRESSION: 1. Increasing mixed patchy and bandlike opacities in the lung bases which are favored to reflect atelectasis though underlying infection is difficult to exclude. 2. Background of chronic emphysematous in coarsened interstitial changes. Electronically Signed   By:  Lovena Le M.D.   On: 02/29/2020 03:57   ECHOCARDIOGRAM COMPLETE  Result Date: 02/29/2020    ECHOCARDIOGRAM REPORT   Patient Name:   Melvin Martin Date of Exam: 02/29/2020 Medical Rec #:   [id number]     Height:       70.0 in Accession #:    [id number]    Weight:       170.0 lb Date of Birth:  [birth year]     BSA:          1.948 m Patient Age:    38 years      BP:           136/96 mmHg Patient Gender: M             HR:           95 bpm. Exam Location:  Inpatient Procedure: 2D Echo, Cardiac Doppler and Color Doppler Indications:    Pulmonary Edema [id number]  History:        Patient has no prior history of Echocardiogram examinations.                 Previous Myocardial Infarction and CAD, COPD; Risk                 Factors:Dyslipidemia. Cancer. GERD.  Sonographer:    Tiffany Dance Referring Phys: [id number] Platter  1. Left ventricular ejection fraction, by estimation, is 55 to 60%. The left ventricle has normal function. The left ventricle has no regional wall motion abnormalities. There is moderate left ventricular hypertrophy. Left ventricular diastolic parameters were normal.  2. Right ventricular systolic function is normal. The right ventricular size is normal. Tricuspid regurgitation signal is inadequate for assessing PA pressure.  3. The mitral valve is normal in structure. No evidence of mitral valve regurgitation.  4. The aortic valve is tricuspid. Aortic valve regurgitation is not visualized. No aortic stenosis is present.  5. The inferior vena cava is normal in size with greater than 50% respiratory variability, suggesting right atrial pressure of 3 mmHg. FINDINGS  Left Ventricle: Left ventricular ejection fraction, by estimation, is 55 to 60%. The left ventricle has normal function. The left ventricle has no regional wall motion abnormalities. The left ventricular internal cavity size was small. There is moderate  left ventricular hypertrophy. Left ventricular diastolic parameters were normal. Right Ventricle: The right ventricular size is normal. Right vetricular wall thickness was not assessed. Right ventricular systolic function is normal. Tricuspid regurgitation signal is  inadequate for assessing PA pressure. Left Atrium: Left atrial size was normal in size. Right Atrium: Right atrial size was normal in size. Pericardium: Trivial pericardial effusion is present. Mitral Valve: The mitral valve is normal in structure. No evidence of mitral valve regurgitation. Tricuspid Valve: The tricuspid valve is normal in structure. Tricuspid valve regurgitation is trivial. Aortic Valve: The aortic valve is tricuspid. Aortic valve regurgitation is not visualized. No aortic stenosis is present. Pulmonic Valve: The pulmonic valve was not well visualized. Pulmonic valve regurgitation is not visualized. Aorta: The aortic root and ascending aorta are structurally normal, with no evidence of dilitation. Venous: The inferior vena cava is normal in size with greater than 50% respiratory variability, suggesting right atrial pressure of 3 mmHg. IAS/Shunts: The interatrial septum was not well visualized.  LEFT VENTRICLE PLAX 2D LVIDd:         3.50 cm  Diastology LVIDs:         2.40 cm  LV e'  lateral:   6.96 cm/s LV PW:         1.40 cm  LV E/e' lateral: 8.1 LV IVS:        1.10 cm  LV e' medial:    7.72 cm/s LVOT diam:     2.00 cm  LV E/e' medial:  7.3 LV SV:         37 LV SV Index:   19 LVOT Area:     3.14 cm  RIGHT VENTRICLE             IVC RV Basal diam:  2.80 cm     IVC diam: 1.40 cm RV S prime:     14.70 cm/s TAPSE (M-mode): 2.2 cm LEFT ATRIUM             Index       RIGHT ATRIUM           Index LA diam:        3.80 cm 1.95 cm/m  RA Area:     11.70 cm LA Vol (A2C):   44.6 ml 22.90 ml/m RA Volume:   25.50 ml  13.09 ml/m LA Vol (A4C):   33.3 ml 17.09 ml/m LA Biplane Vol: 39.2 ml 20.12 ml/m  AORTIC VALVE LVOT Vmax:   72.10 cm/s LVOT Vmean:  47.800 cm/s LVOT VTI:    0.117 m  AORTA Ao Root diam: 3.40 cm Ao Asc diam:  3.30 cm MITRAL VALVE MV Area (PHT): 3.77 cm    SHUNTS MV Decel Time: 201 msec    Systemic VTI:  0.12 m MV E velocity: 56.20 cm/s  Systemic Diam: 2.00 cm MV A velocity: 78.50 cm/s MV E/A  ratio:  0.72 Oswaldo Milian MD Electronically signed by Oswaldo Milian MD Signature Date/Time: 02/29/2020/2:46:49 PM    Final     DISCHARGE EXAMINATION: Vitals:   03/01/20 1951 03/02/20 0552 03/02/20 0928 03/02/20 0947  BP: 125/62 135/78    Pulse: 94 86  (!) 110  Resp: 18 19    Temp: 98 F (36.7 C) 97.8 F (36.6 C)    TempSrc: Oral Oral    SpO2: 94% 91% 92% 91%  Weight:      Height:       General appearance: Awake alert.  In no distress Resp: Improved effort.  No definite crackles or wheezing appreciated.  Coarse breath sounds bilaterally. Cardio: S1-S2 is normal regular.  No S3-S4.  No rubs murmurs or bruit GI: Abdomen is soft.  Nontender nondistended.  Bowel sounds are present normal.  No masses organomegaly    DISPOSITION: Home  Discharge Instructions    Call MD for:  difficulty breathing, headache or visual disturbances   Complete by: As directed    Call MD for:  extreme fatigue   Complete by: As directed    Call MD for:  persistant dizziness or light-headedness   Complete by: As directed    Call MD for:  persistant nausea and vomiting   Complete by: As directed    Call MD for:  severe uncontrolled pain   Complete by: As directed    Call MD for:  temperature >100.4   Complete by: As directed    Diet general   Complete by: As directed    Discharge instructions   Complete by: As directed    Please be sure to follow-up with your pulmonologist at [facility].  Please return to the hospital or seek attention if your symptoms were to get worse.  You were cared  for by a hospitalist during your hospital stay. If you have any questions about your discharge medications or the care you received while you were in the hospital after you are discharged, you can call the unit and asked to speak with the hospitalist on call if the hospitalist that took care of you is not available. Once you are discharged, your primary care physician will handle any further medical issues. Please  note that NO REFILLS for any discharge medications will be authorized once you are discharged, as it is imperative that you return to your primary care physician (or establish a relationship with a primary care physician if you do not have one) for your aftercare needs so that they can reassess your need for medications and monitor your lab values. If you do not have a primary care physician, you can call [phone] for a physician referral.   Increase activity slowly   Complete by: As directed          Allergies as of 03/02/2020      Reactions   Metoprolol Shortness Of Breath   Caused BP to increase    Imdur [isosorbide Nitrate]    Decreased bp      Medication List    STOP taking these medications   azelastine 0.1 % nasal spray Commonly known as: ASTELIN   methocarbamol 500 MG tablet Commonly known as: ROBAXIN     TAKE these medications   acyclovir 400 MG tablet Commonly known as: ZOVIRAX Take 400 mg by mouth 2 (two) times daily.   aspirin 81 MG chewable tablet Chew 81 mg by mouth daily.   Combivent Respimat 20-100 MCG/ACT Aers respimat Generic drug: Ipratropium-Albuterol Inhale 1 puff into the lungs 4 (four) times daily as needed for wheezing.   doxycycline 100 MG tablet Commonly known as: ADOXA Take 1 tablet (100 mg total) by mouth 2 (two) times daily for 5 days.   furosemide 20 MG tablet Commonly known as: Lasix Take 1 tablet (20 mg total) by mouth daily.   loratadine-pseudoephedrine 5-120 MG tablet Commonly known as: CLARITIN-D 12-hour Take 1 tablet by mouth daily as needed for allergies.   Myrbetriq 50 MG Tb24 tablet Generic drug: mirabegron ER Take 1 tablet by mouth daily.   nitroGLYCERIN 0.4 MG SL tablet Commonly known as: Nitrostat Place 1 tablet (0.4 mg total) under the tongue every 5 (five) minutes as needed for chest pain.   omeprazole 40 MG capsule Commonly known as: PRILOSEC Take 40 mg by mouth daily.   oxymetazoline 0.05 % nasal  spray Commonly known as: AFRIN Place 2 sprays into both nostrils 2 (two) times daily as needed for congestion.   potassium chloride 10 MEQ tablet Commonly known as: KLOR-CON Take 1 tablet (10 mEq total) by mouth daily.   predniSONE 20 MG tablet Commonly known as: DELTASONE Take 2 tablets twice daily for 3 days, followed by 3 tablets once daily for 3 days, followed by 2 tablets once daily for 3 days, followed by 1 tablet once daily for 3 days, and then stop What changed: additional instructions   pseudoephedrine 30 MG tablet Commonly known as: SUDAFED Take 30 mg by mouth every 4 (four) hours as needed for congestion.   rosuvastatin 10 MG tablet Commonly known as: CRESTOR Take 10 mg by mouth daily.   sodium chloride 0.65 % Soln nasal spray Commonly known as: OCEAN Place 1 spray into both nostrils as needed for congestion (nose irritation).   Stiolto Respimat 2.5-2.5 MCG/ACT Aers Generic drug:  Tiotropium Bromide-Olodaterol Inhale 2 puffs into the lungs daily.   tamsulosin 0.4 MG Caps capsule Commonly known as: FLOMAX Take 0.4 mg by mouth at bedtime.   Testosterone Undecanoate 237 MG Caps Take 1 capsule by mouth 2 (two) times daily.   vitamin B-12 1000 MCG tablet Commonly known as: CYANOCOBALAMIN Take 1,000 mcg by mouth daily.   vitamin C with rose hips 1000 MG tablet Take 1,000 mg by mouth daily.   Zinc 50 MG Caps Take 50 mg by mouth daily.            Durable Medical Equipment  (From admission, onward)         Start     Ordered   03/02/20 0949  For home use only DME oxygen  Once       Question Answer Comment  Length of Need 6 Months   Mode or (Route) Nasal cannula   Liters per Minute 3   Frequency Continuous (stationary and portable oxygen unit needed)   Oxygen conserving device Yes   Oxygen delivery system Gas      03/02/20 0948            Follow-up Information    Eliezer Champagne, MD. Schedule an appointment as soon as possible for a visit in 3  week(s).   Specialty: Pulmonary Disease Contact information: [address] [place] [address] [phone]               TOTAL DISCHARGE TIME: 35 minutes  [facility] Pager on [email or web address]  03/02/2020, 1:16 PM

## 2020-03-02 NOTE — Discharge Instructions (Signed)
Acute Respiratory Failure, Adult  Acute respiratory failure occurs when there is not enough oxygen passing from your lungs to your body. When this happens, your lungs have trouble removing carbon dioxide from the blood. This causes your blood oxygen level to drop too low as carbon dioxide builds up. Acute respiratory failure is a medical emergency. It can develop quickly, but it is temporary if treated promptly. Your lung capacity, or how much air your lungs can hold, may improve with time, exercise, and treatment. What are the causes? There are many possible causes of acute respiratory failure, including:  Lung injury.  Chest injury or damage to the ribs or tissues near the lungs.  Lung conditions that affect the flow of air and blood into and out of the lungs, such as pneumonia, acute respiratory distress syndrome, and cystic fibrosis.  Medical conditions, such as strokes or spinal cord injuries, that affect the muscles and nerves that control breathing.  Blood infection (sepsis).  Inflammation of the pancreas (pancreatitis).  A blood clot in the lungs (pulmonary embolism).  A large-volume blood transfusion.  Burns.  Near-drowning.  Seizure.  Smoke inhalation.  Reaction to medicines.  Alcohol or drug overdose. What increases the risk? This condition is more likely to develop in people who have:  A blocked airway.  Asthma.  A condition or disease that damages or weakens the muscles, nerves, bones, or tissues that are involved in breathing.  A serious infection.  A health problem that blocks the unconscious reflex that is involved in breathing, such as hypothyroidism or sleep apnea.  A lung injury or trauma. What are the signs or symptoms? Trouble breathing is the main symptom of acute respiratory failure. Symptoms may also include:  Rapid breathing.  Restlessness or anxiety.  Skin, lips, or fingernails that appear blue (cyanosis).  Rapid heart  rate.  Abnormal heart rhythms (arrhythmias).  Confusion or changes in behavior.  Tiredness or loss of energy.  Feeling sleepy or having a loss of consciousness. How is this diagnosed? Your health care provider can diagnose acute respiratory failure with a medical history and physical exam. During the exam, your health care provider will listen to your heart and check for crackling or wheezing sounds in your lungs. Your may also have tests to confirm the diagnosis and determine what is causing respiratory failure. These tests may include:  Measuring the amount of oxygen in your blood (pulse oximetry). The measurement comes from a small device that is placed on your finger, earlobe, or toe.  Other blood tests to measure blood gases and to look for signs of infection.  Sampling your cerebral spinal fluid or tracheal fluid to check for infections.  Chest X-ray to look for fluid in spaces that should be filled with air.  Electrocardiogram (ECG) to look at the heart's electrical activity. How is this treated? Treatment for this condition usually takes places in a hospital intensive care unit (ICU). Treatment depends on what is causing the condition. It may include one or more treatments until your symptoms improve. Treatment may include:  Supplemental oxygen. Extra oxygen is given through a tube in the nose, a face mask, or a hood.  A device such as a continuous positive airway pressure (CPAP) or bi-level positive airway pressure (BiPAP or BPAP) machine. This treatment uses mild air pressure to keep the airways open. A mask or other device will be placed over your nose or mouth. A tube that is connected to a motor will deliver oxygen through the   mask.  Ventilator. This treatment helps move air into and out of the lungs. This may be done with a bag and mask or a machine. For this treatment, a tube is placed in your windpipe (trachea) so air and oxygen can flow to the lungs.  Extracorporeal  membrane oxygenation (ECMO). This treatment temporarily takes over the function of the heart and lungs, supplying oxygen and removing carbon dioxide. ECMO gives the lungs a chance to recover. It may be used if a ventilator is not effective.  Tracheostomy. This is a procedure that creates a hole in the neck to insert a breathing tube.  Receiving fluids and medicines.  Rocking the bed to help breathing. Follow these instructions at home:  Take over-the-counter and prescription medicines only as told by your health care provider.  Return to normal activities as told by your health care provider. Ask your health care provider what activities are safe for you.  Keep all follow-up visits as told by your health care provider. This is important. How is this prevented? Treating infections and medical conditions that may lead to acute respiratory failure can help prevent the condition from developing. Contact a health care provider if:  You have a fever.  Your symptoms do not improve or they get worse. Get help right away if:  You are having trouble breathing.  You lose consciousness.  Your have cyanosis or turn blue.  You develop a rapid heart rate.  You are confused. These symptoms may represent a serious problem that is an emergency. Do not wait to see if the symptoms will go away. Get medical help right away. Call your local emergency services (911 in the [place] Do not drive yourself to the hospital. This information is not intended to replace advice given to you by your health care provider. Make sure you discuss any questions you have with your health care provider. Document Revised: 08/06/2017 Document Reviewed: 03/11/2016 Elsevier Patient Education  2020 Elsevier Inc.  

## 2020-03-02 NOTE — TOC Initial Note (Signed)
Transition of Care [facility]) - Initial/Assessment Note    Patient Details  Name: Melvin Martin MRN: [id number] Date of Birth: [birth year]  Transition of Care [facility]) CM/SW Contact:    Joaquin Courts, RN Phone Number: 03/02/2020, 11:27 AM  Clinical Narrative:      [employer] rep [place] given referral for home Oxygen. Portable tank to be delivered to bedside for travel home.              Expected Discharge Plan: [place] Barriers to Discharge: No Barriers Identified   Patient Goals and CMS Choice Patient states their goals for this hospitalization and ongoing recovery are:: to go home CMS Medicare.gov Compare Post Acute Care list provided to:: Patient Choice offered to / list presented to : Patient  Expected Discharge Plan and Services Expected Discharge Plan: [place]   Discharge Planning Services: CM Consult Post Acute Care Choice: Durable Medical Equipment Living arrangements for the past 2 months: Single Family Home Expected Discharge Date: 03/02/20               DME Arranged: Oxygen DME Agency: [employer] Date DME Agency Contacted: 03/02/20 Time DME Agency Contacted: 0623 Representative spoke with at DME Agency: [place]: NA [place] Agency: NA        Prior Living Arrangements/Services Living arrangements for the past 2 months: [place]   Patient language and need for interpreter reviewed:: Yes Do you feel safe going back to the place where you live?: Yes      Need for Family Participation in Patient Care: Yes (Comment) Care giver support system in place?: Yes (comment)   Criminal Activity/Legal Involvement Pertinent to Current Situation/Hospitalization: No - Comment as needed  Activities of Daily Living Home Assistive Devices/Equipment: Eyeglasses (reading glasses) ADL Screening (condition at time of admission) Patient's cognitive ability adequate to safely complete daily activities?: Yes Is the patient deaf or  have difficulty hearing?: Yes Does the patient have difficulty seeing, even when wearing glasses/contacts?: No Does the patient have difficulty concentrating, remembering, or making decisions?: No Patient able to express need for assistance with ADLs?: Yes Does the patient have difficulty dressing or bathing?: No Independently performs ADLs?: Yes (appropriate for developmental age) Does the patient have difficulty walking or climbing stairs?: Yes (gets short of breath) Weakness of Legs: Both Weakness of Arms/Hands: Both  Permission Sought/Granted                  Emotional Assessment Appearance:: Appears stated age Attitude/Demeanor/Rapport: Engaged Affect (typically observed): Accepting Orientation: : Oriented to Self, Oriented to Place, Oriented to  Time, Oriented to Situation   Psych Involvement: No (comment)  Admission diagnosis:  Acute respiratory failure with hypoxia ([place]) [J96.01] Acute hypoxemic respiratory failure ([place]) [J96.01] Patient Active Problem List   Diagnosis Date Noted  . Acute respiratory failure with hypoxia ([place]) 02/29/2020  . Centrilobular emphysema ([place]) 02/29/2020  . BPH (benign prostatic hyperplasia) 02/29/2020  . Hyperkalemia 03/25/2018  . Chest pain 03/25/2018  . COPD (chronic obstructive pulmonary disease) ([place])   . CLL (chronic lymphocytic leukemia) ([place]) 01/06/2017  . Coronary artery disease   . Non-ST elevation myocardial infarction (NSTEMI), initial care episode ([place]) 08/18/2012  . GERD (gastroesophageal reflux disease)   . Dyslipidemia (high LDL; low HDL)    PCP:  Marton Redwood, MD Pharmacy:   [facility] [place] [address] Phone: [phone]  Fax: [place] #[id number], [place] AT [facility] [address] [place] [address] Phone: [phone] Fax:  [phone]     Social Determinants of Health ([place]) Interventions    Readmission Risk Interventions No flowsheet data found.

## 2020-03-02 NOTE — Progress Notes (Signed)
Patient discharged home in stable condition. Discharge instructions given. Scripts sent to pharmacy of choice. No immediate questions or concerns at this time. 02 delivered to patient's room. Pt discharged from unit via wheelchair.

## 2020-03-02 NOTE — Progress Notes (Signed)
Pt with episode of nose bleed to left nares that lasted approx 10 min responded well to pressure.

## 2020-03-06 ENCOUNTER — Other Ambulatory Visit: Payer: Self-pay

## 2020-03-06 MED ORDER — NITROGLYCERIN 0.4 MG SL SUBL: 0.4000 mg | SUBLINGUAL_TABLET | SUBLINGUAL | 12 refills | Status: DC | PRN

## 2020-03-06 NOTE — Telephone Encounter (Signed)
Rx(s) sent to pharmacy electronically.  

## 2020-10-03 NOTE — Progress Notes (Unsigned)
Virtual Visit via Video Note   This visit type was conducted due to national recommendations for restrictions regarding the COVID-19 Pandemic (e.g. social distancing) in an effort to limit this patient's exposure and mitigate transmission in our community.  Due to his co-morbid illnesses, this patient is at least at moderate risk for complications without adequate follow up.  This format is felt to be most appropriate for this patient at this time.  All issues noted in this document were discussed and addressed.  A limited physical exam was performed with this format.  Please refer to the patient's chart for his consent to telehealth for [facility].       Date:  10/03/2020   ID:  Melvin Martin, DOB [birth year], MRN [id number] The patient was identified using 2 identifiers.  Patient Location: Home Provider Location: Home Office  PCP:  Marton Redwood, MD  Cardiologist:  Khali Albanese [name], MD  Electrophysiologist:  None   Evaluation Performed:  Follow-Up Visit  Chief Complaint:  CAD  History of Present Illness:    Melvin Martin is a 65 y.o. male with history of CAD. He has known CAD with NSTEMI in December of 2013 with PCI that included DES to the ramus and LAD, HLD and GERD. 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 He had a Myoview study in January 2016 that showed normal perfusion and EF 49%.   He was seen by our service in the hospital in July 2019 with atypical chest pain. States the only time he gets chest pain is when arguing with his ex wife.  Ecg was normal. Troponin levels were normal. Repeat Myoview as an outpatient was normal.  He does have CLL and started on chemotherapy in September 2019 for progressive lymphadenopathy and leucocytosis at [facility]. Echo there in August 2019 showed a normal study. He was treated with  Ibrutinib and completed therapy one year ago.  He was admitted in June 2021 with acute respiratory failure. Treated with antibiotics  and diuresis. Echo was normal. CT negative for PE.   He did subsequently get Covid last summer. Was admitted on July 9 at [facility]. Treated with Remdesivir, steroids and monoclonal antibodies.  Was severely fatigued afterwards and it took several months to recover his exercise tolerance.   He is now back doing his elliptical and is doing some strength training 3x/week. Overall feels fortunate to have gotten through these illnesses. He has no angina with his exercise. Is scheduled for physical with Dr Brigitte Pulse in March.  The patient does not have symptoms concerning for COVID-19 infection (fever, chills, cough, or new shortness of breath).    Past Medical History:  Diagnosis Date  . ADD (attention deficit disorder)   . Asthmatic bronchitis   . Cancer ([place])   . Chest pain    pt relates to GERDS and asthmatic bronchitis  . CLL (chronic lymphocytic leukemia) ([place])   . COPD (chronic obstructive pulmonary disease) ([place])   . Coronary artery disease   . GERD (gastroesophageal reflux disease)   . Hyperlipidemia LDL goal < 70   . Low testosterone   . NSTEMI (non-ST elevated myocardial infarction) [facility]) December 2013   with PCI with DES to the ramus and LAD   Past Surgical History:  Procedure Laterality Date  . CORONARY ANGIOPLASTY WITH STENT PLACEMENT  08/18/2012   DES to the ramus and LAD  . Cyst removal, mouth    . LEFT HEART CATHETERIZATION WITH CORONARY ANGIOGRAM  N/A 08/18/2012   Procedure: LEFT HEART CATHETERIZATION WITH CORONARY ANGIOGRAM;  Surgeon: Sherren Mocha, MD;  Location: [facility] CATH LAB;  Service: Cardiovascular;  Laterality: N/A;  . PERCUTANEOUS CORONARY STENT INTERVENTION (PCI-S)  08/18/2012   Procedure: PERCUTANEOUS CORONARY STENT INTERVENTION (PCI-S);  Surgeon: Sherren Mocha, MD;  Location: [facility] C[facility] LAB;  Service: Cardiovascular;;     No outpatient medications have been marked as taking for the 10/04/20 encounter (Appointment) with [name], Zariana Strub M, MD.     Allergies:   Metoprolol  and Imdur [isosorbide nitrate]   Social History   Tobacco Use  . Smoking status: Never Smoker  . Smokeless tobacco: Never Used  Vaping Use  . Vaping Use: Never used  Substance Use Topics  . Alcohol use: No  . Drug use: No     Family Hx: The patient's family history includes Emphysema in his mother; Heart attack in his cousin and father; Heart failure in his father.  ROS:   Please see the history of present illness.    All other systems reviewed and are negative.   Prior CV studies:   The following studies were reviewed today:  Myoview 04/01/18: Study Highlights    Nuclear stress EF: 52%.  The left ventricular ejection fraction is mildly decreased (45-54%).  Blood pressure demonstrated a normal response to exercise.  There was no ST segment deviation noted during stress.  No T wave inversion was noted during stress.  This is a low risk study.   No reversible ischemia. LVEF 52% with normal wall motion. This is a low risk study   Echo 02/29/20: IMPRESSIONS    1. Left ventricular ejection fraction, by estimation, is 55 to 60%. The  left ventricle has normal function. The left ventricle has no regional  wall motion abnormalities. There is moderate left ventricular hypertrophy.  Left ventricular diastolic  parameters were normal.  2. Right ventricular systolic function is normal. The right ventricular  size is normal. Tricuspid regurgitation signal is inadequate for assessing  PA pressure.  3. The mitral valve is normal in structure. No evidence of mitral valve  regurgitation.  4. The aortic valve is tricuspid. Aortic valve regurgitation is not  visualized. No aortic stenosis is present.  5. The inferior vena cava is normal in size with greater than 50%  respiratory variability, suggesting right atrial pressure of 3 mmHg.   Labs/Other Tests and Data Reviewed:    EKG:  No ECG reviewed.  Recent Labs: 02/29/2020: ALT 60; B Natriuretic Peptide 19.0; TSH  1.065 03/02/2020: BUN 26; Creatinine, Ser 0.96; Hemoglobin 15.9; Platelets 188; Potassium 4.6; Sodium 137   Recent Lipid Panel Lab Results  Component Value Date/Time   CHOL 66 03/25/2018 12:37 PM   TRIG 109 03/25/2018 12:37 PM   HDL 12 (L) 03/25/2018 12:37 PM   CHOLHDL 5.5 03/25/2018 12:37 PM   LDLCALC 32 03/25/2018 12:37 PM   Dated 12/01/19: cholesterol 102, triglycerides 102, HDL 31, LDL 52.  Dated 07/23/20: CBC normal. CMET normal.    Wt Readings from Last 3 Encounters:  02/29/20 170 lb (77.1 kg)  02/20/20 170 lb (77.1 kg)  09/28/19 173 lb (78.5 kg)     Risk Assessment/Calculations:      Objective:    Vital Signs:  There were no vitals taken for this visit.   VITAL SIGNS:  reviewed GEN:  no acute distress  HEENT negative Lungs: no respiratory distress.  Neuro: alert and oriented x 3, no focal deficits  ASSESSMENT & PLAN:  1. CAD - past NSTEMI with PCI of the LAD and ramus intermediate with DES in 2013.  Normal Myoview study in July 2019 and normal Echo in August 2019. He is asymptomatic now. Continue ASA, Toprol, and statin. Continue exercise program.   2. HLD - on statin therapy. Last LDL excellent. Labs followed by primary care. Repeat in March.    3. CLL. s/p chemotherapy with improvement.   4. COPD/emphysema. No history of smoking. Followed by pulmonary at [facility]. Continue inhalers and exercise program. Had acute respiratory failure in June 2021. Echo normal at that time.  5. S/p Covid 19 infection in July 2021 with respiratory failure. Resolved.          COVID-19 Education: The signs and symptoms of COVID-19 were discussed with the patient and how to seek care for testing (follow up with PCP or arrange E-visit).  The importance of social distancing was discussed today.  Time:   Today, I have spent 15 minutes with the patient with telehealth technology discussing the above problems.     Medication Adjustments/Labs and Tests Ordered: Current medicines  are reviewed at length with the patient today.  Concerns regarding medicines are outlined above.   Tests Ordered: No orders of the defined types were placed in this encounter.   Medication Changes: No orders of the defined types were placed in this encounter.   Follow Up:  In Person in 1 year(s)  Signed, Findlay Dagher [name], MD  10/03/2020 2:49 PM    [place]

## 2020-10-04 ENCOUNTER — Encounter: Payer: Self-pay | Admitting: Cardiology

## 2020-10-04 ENCOUNTER — Telehealth: Payer: Self-pay

## 2020-10-04 ENCOUNTER — Telehealth (INDEPENDENT_AMBULATORY_CARE_PROVIDER_SITE_OTHER): Payer: Managed Care, Other (non HMO) | Admitting: Cardiology

## 2020-10-04 VITALS — BP 116/83 | HR 86 | Ht 70.0 in | Wt 169.0 lb

## 2020-10-04 DIAGNOSIS — J439 Emphysema, unspecified: Secondary | ICD-10-CM | POA: Diagnosis not present

## 2020-10-04 DIAGNOSIS — I2510 Atherosclerotic heart disease of native coronary artery without angina pectoris: Secondary | ICD-10-CM | POA: Diagnosis not present

## 2020-10-04 DIAGNOSIS — E7800 Pure hypercholesterolemia, unspecified: Secondary | ICD-10-CM

## 2020-10-04 NOTE — Patient Instructions (Signed)
Medication Instructions:  Continue same medications *If you need a refill on your cardiac medications before your next appointment, please call your pharmacy*   Lab Work: None ordered   Testing/Procedures: None ordered   Follow-Up: At [facility], you and your health needs are our priority.  As part of our continuing mission to provide you with exceptional heart care, we have created designated Provider Care Teams.  These Care Teams include your primary Cardiologist (physician) and Advanced Practice Providers (APPs -  Physician Assistants and Nurse Practitioners) who all work together to provide you with the care you need, when you need it.  We recommend signing up for the patient portal called "MyChart".  Sign up information is provided on this After Visit Summary.  MyChart is used to connect with patients for Virtual Visits (Telemedicine).  Patients are able to view lab/test results, encounter notes, upcoming appointments, etc.  Non-urgent messages can be sent to your provider as well.   To learn more about what you can do with MyChart, go to [email or web address].com.    Your next appointment:  1 year     Call in Oct to schedule Jan appointment    The format for your next appointment: Office    Provider:  Dr.Jordan   

## 2020-10-04 NOTE — Telephone Encounter (Signed)
  Patient Consent for Virtual Visit         ISAIAH Martin has provided verbal consent on 10/04/2020 for a virtual visit (video or telephone).   CONSENT FOR VIRTUAL VISIT FOR:  Melvin Martin  By participating in this virtual visit I agree to the following:  I hereby voluntarily request, consent and authorize [facility] and its employed or contracted physicians, physician assistants, nurse practitioners or other licensed health care professionals (the Practitioner), to provide me with telemedicine health care services (the "Services") as deemed necessary by the treating Practitioner. I acknowledge and consent to receive the Services by the Practitioner via telemedicine. I understand that the telemedicine visit will involve communicating with the Practitioner through live audiovisual communication technology and the disclosure of certain medical information by electronic transmission. I acknowledge that I have been given the opportunity to request an in-person assessment or other available alternative prior to the telemedicine visit and am voluntarily participating in the telemedicine visit.  I understand that I have the right to withhold or withdraw my consent to the use of telemedicine in the course of my care at any time, without affecting my right to future care or treatment, and that the Practitioner or I may terminate the telemedicine visit at any time. I understand that I have the right to inspect all information obtained and/or recorded in the course of the telemedicine visit and may receive copies of available information for a reasonable fee.  I understand that some of the potential risks of receiving the Services via telemedicine include:  Marland Kitchen Delay or interruption in medical evaluation due to technological equipment failure or disruption; . Information transmitted may not be sufficient (e.g. poor resolution of images) to allow for appropriate medical decision making by the Practitioner;  and/or  . In rare instances, security protocols could fail, causing a breach of personal health information.  Furthermore, I acknowledge that it is my responsibility to provide information about my medical history, conditions and care that is complete and accurate to the best of my ability. I acknowledge that Practitioner's advice, recommendations, and/or decision may be based on factors not within their control, such as incomplete or inaccurate data provided by me or distortions of diagnostic images or specimens that may result from electronic transmissions. I understand that the practice of medicine is not an exact science and that Practitioner makes no warranties or guarantees regarding treatment outcomes. I acknowledge that a copy of this consent can be made available to me via my patient portal ([place]), or I can request a printed copy by calling the office of [place].    I understand that my insurance will be billed for this visit.   I have read or had this consent read to me. . I understand the contents of this consent, which adequately explains the benefits and risks of the Services being provided via telemedicine.  . I have been provided ample opportunity to ask questions regarding this consent and the Services and have had my questions answered to my satisfaction. . I give my informed consent for the services to be provided through the use of telemedicine in my medical care

## 2021-05-16 ENCOUNTER — Other Ambulatory Visit: Payer: Self-pay | Admitting: Cardiology

## 2021-06-19 ENCOUNTER — Emergency Department (HOSPITAL_COMMUNITY): Payer: Managed Care, Other (non HMO)

## 2021-06-19 ENCOUNTER — Emergency Department (HOSPITAL_COMMUNITY)
Admission: EM | Admit: 2021-06-19 | Discharge: 2021-06-20 | Disposition: A | Discharge status: Home / Self Care | Payer: Managed Care, Other (non HMO) | Attending: Emergency Medicine | Admitting: Emergency Medicine

## 2021-06-19 ENCOUNTER — Other Ambulatory Visit: Payer: Self-pay

## 2021-06-19 ENCOUNTER — Encounter (HOSPITAL_COMMUNITY): Payer: Self-pay | Admitting: *Deleted

## 2021-06-19 DIAGNOSIS — Z7951 Long term (current) use of inhaled steroids: Secondary | ICD-10-CM | POA: Diagnosis not present

## 2021-06-19 DIAGNOSIS — Z859 Personal history of malignant neoplasm, unspecified: Secondary | ICD-10-CM | POA: Insufficient documentation

## 2021-06-19 DIAGNOSIS — I2510 Atherosclerotic heart disease of native coronary artery without angina pectoris: Secondary | ICD-10-CM | POA: Insufficient documentation

## 2021-06-19 DIAGNOSIS — Z7982 Long term (current) use of aspirin: Secondary | ICD-10-CM | POA: Insufficient documentation

## 2021-06-19 DIAGNOSIS — R079 Chest pain, unspecified: Secondary | ICD-10-CM | POA: Insufficient documentation

## 2021-06-19 DIAGNOSIS — J449 Chronic obstructive pulmonary disease, unspecified: Secondary | ICD-10-CM | POA: Diagnosis not present

## 2021-06-19 DIAGNOSIS — Z79899 Other long term (current) drug therapy: Secondary | ICD-10-CM | POA: Insufficient documentation

## 2021-06-19 LAB — CBC
HCT: 47.8 % (ref 39.0–52.0)
Hemoglobin: 16.7 g/dL (ref 13.0–17.0)
MCH: 34.6 pg — ABNORMAL HIGH (ref 26.0–34.0)
MCHC: 34.9 g/dL (ref 30.0–36.0)
MCV: 99.2 fL (ref 80.0–100.0)
Platelets: 267 10*3/uL (ref 150–400)
RBC: 4.82 MIL/uL (ref 4.22–5.81)
RDW: 12.6 % (ref 11.5–15.5)
WBC: 9.7 10*3/uL (ref 4.0–10.5)
nRBC: 0 % (ref 0.0–0.2)

## 2021-06-19 LAB — BASIC METABOLIC PANEL
Anion gap: 9 (ref 5–15)
BUN: 13 mg/dL (ref 8–23)
CO2: 24 mmol/L (ref 22–32)
Calcium: 9.3 mg/dL (ref 8.9–10.3)
Chloride: 103 mmol/L (ref 98–111)
Creatinine, Ser: 0.97 mg/dL (ref 0.61–1.24)
GFR, Estimated: >60 mL/min (ref >60)
Glucose, Bld: 101 mg/dL — ABNORMAL HIGH (ref 70–99)
Potassium: 4.1 mmol/L (ref 3.5–5.1)
Sodium: 136 mmol/L (ref 135–145)

## 2021-06-19 LAB — TROPONIN I (HIGH SENSITIVITY)
Troponin I (High Sensitivity): 4 ng/L (ref <18)
Troponin I (High Sensitivity): 4 ng/L (ref <18)

## 2021-06-19 MED ORDER — ASPIRIN 325 MG PO TABS
325.0000 mg | ORAL_TABLET | Freq: Every day | ORAL | Status: DC
  Administered 2021-06-19: 325 mg via ORAL
  Filled 2021-06-19: qty 1

## 2021-06-19 NOTE — ED Triage Notes (Signed)
Pt reports onset of chest pain last Thursday, had relief with nitro at home. Pain returned this afternoon. describes pain as tightening and radiating across his chest. Has cardiac hx.

## 2021-06-19 NOTE — ED Provider Notes (Signed)
Emergency Medicine Provider Triage Evaluation Note  JARRIS KORTZ , a 65 y.o. male  was evaluated in triage.  Pt complains of left-sided chest pain that began a few hours ago.  He does endorse a history of myocardial infarction requiring 2 stents.  He states he also had an episode 1 week ago which was relieved with nitroglycerin.  He denies any shortness of breath, diaphoresis, nausea, vomiting, leg pain, leg swelling.  Has not taken any aspirin or nitroglycerin today.  Review of Systems  Positive:  Negative: See above   Physical Exam  BP (!) 145/99 (BP Location: Left Arm)   Pulse 87   Temp 98.4 F (36.9 C)   Resp 17   SpO2 96%  Gen:   Awake, no distress   Resp:  Normal effort  MSK:   Moves extremities without difficulty  Other:    Medical Decision Making  Medically screening exam initiated at 7:18 PM.  Appropriate orders placed.  AHAMED HOFLAND was informed that the remainder of the evaluation will be completed by another provider, this initial triage assessment does not replace that evaluation, and the importance of remaining in the ED until their evaluation is complete.     Hendricks Limes, PA-C 06/19/21 1919    Davonna Belling, MD 06/19/21 2228

## 2021-06-20 NOTE — ED Provider Notes (Signed)
[facility] EMERGENCY DEPARTMENT Provider Note   CSN: [id number] Arrival date & time: 06/19/21  1859     History Chief Complaint  Patient presents with   Chest Pain    Melvin Martin is a 65 y.o. male.  The history is provided by the patient.  Chest Pain He has history of coronary artery disease, chronic lymphocytic leukemia, COPD and comes in because of an episode of chest pain.  Pain started about 4 PM at rest and is described as a sharp pain across his lower chest.  Pain is similar to what he had with his heart attack.  There is no associated dyspnea, nausea, diaphoresis.  Pain waxed and waned, but nothing consistently made the pain better or worse.  Pain was rated at 6/10 at its worst and resolved after about 2 hours.  He is a non-smoker and denies history of diabetes or hypertension.  There is a family history of premature coronary atherosclerosis.   Past Medical History:  Diagnosis Date   ADD (attention deficit disorder)    Asthmatic bronchitis    Cancer (HCC)    Chest pain    pt relates to GERDS and asthmatic bronchitis   CLL (chronic lymphocytic leukemia) (HCC)    COPD (chronic obstructive pulmonary disease) (HCC)    Coronary artery disease    GERD (gastroesophageal reflux disease)    Hyperlipidemia LDL goal < 70    Low testosterone    NSTEMI (non-ST elevated myocardial infarction) [facility]) December 2013   with PCI with DES to the ramus and LAD    Patient Active Problem List   Diagnosis Date Noted   Acute respiratory failure with hypoxia ([place]) 02/29/2020   Centrilobular emphysema ([place]) 02/29/2020   BPH (benign prostatic hyperplasia) 02/29/2020   Hyperkalemia 03/25/2018   Chest pain 03/25/2018   COPD (chronic obstructive pulmonary disease) ([place])    CLL (chronic lymphocytic leukemia) ([place]) 01/06/2017   Coronary artery disease    Non-ST elevation myocardial infarction (NSTEMI), initial care episode ([place]) 08/18/2012   GERD (gastroesophageal reflux  disease)    Dyslipidemia (high LDL; low HDL)     Past Surgical History:  Procedure Laterality Date   CORONARY ANGIOPLASTY WITH STENT PLACEMENT  08/18/2012   DES to the ramus and LAD   Cyst removal, mouth     LEFT HEART CATHETERIZATION WITH CORONARY ANGIOGRAM N/A 08/18/2012   Procedure: LEFT HEART CATHETERIZATION WITH CORONARY ANGIOGRAM;  Surgeon: Sherren Mocha, MD;  Location: [facility] CATH LAB;  Service: Cardiovascular;  Laterality: N/A;   PERCUTANEOUS CORONARY STENT INTERVENTION (PCI-S)  08/18/2012   Procedure: PERCUTANEOUS CORONARY STENT INTERVENTION (PCI-S);  Surgeon: Sherren Mocha, MD;  Location: [facility] CATH LAB;  Service: Cardiovascular;;       Family History  Problem Relation Age of Onset   Emphysema Mother    Heart failure Father    Heart attack Father    Heart attack Cousin     Social History   Tobacco Use   Smoking status: Never   Smokeless tobacco: Never  Vaping Use   Vaping Use: Never used  Substance Use Topics   Alcohol use: No   Drug use: No    Home Medications Prior to Admission medications   Medication Sig Start Date End Date Taking? Authorizing Provider  acyclovir (ZOVIRAX) 400 MG tablet Take 400 mg by mouth 2 (two) times daily.  08/08/18   [provider]  Ascorbic Acid (VITAMIN C WITH ROSE HIPS) 1000 MG tablet Take 1,000 mg by mouth  daily.    [provider]  Ascorbic Acid (VITAMIN C) 1000 MG tablet Take 2000 mg daily 10/04/20   [name], [name], MD  aspirin 81 MG chewable tablet Chew 81 mg by mouth daily.  08/19/12   [provider]  Azelastine HCl 137 MCG/SPRAY SOLN Use 1 spray twice a day 10/04/20   [name], [name], MD  furosemide (LASIX) 20 MG tablet Take 1 tablet (20 mg total) by mouth daily. Patient not taking: Reported on 10/04/2020 03/02/20   Bonnielee Haff, MD  Ipratropium-Albuterol (COMBIVENT RESPIMAT) 20-100 MCG/ACT AERS respimat Inhale 1 puff into the lungs every 6 (six) hours. 10/04/20   [name], [name], MD   loratadine-pseudoephedrine (CLARITIN-D 12-HOUR) 5-120 MG tablet Take 1 tablet by mouth daily as needed for allergies.     [provider]  Multiple Vitamins-Minerals (MULTIVITAMIN) tablet Take 1 tablet by mouth daily. 10/04/20   [name], [name], MD  MYRBETRIQ 50 MG TB24 tablet Take 1 tablet by mouth daily. Patient not taking: Reported on 10/04/2020 04/20/19   [provider]  nitroGLYCERIN (NITROSTAT) 0.4 MG SL tablet DISSOLVE 1 TABLET UNDER THE TONGUE EVERY 5 MINUTES AS NEEDED FOR CHEST PAIN 05/16/21   [name], [name], MD  omeprazole (PRILOSEC) 40 MG capsule Take 40 mg by mouth daily.    [provider]  potassium chloride (KLOR-CON) 10 MEQ tablet Take 1 tablet (10 mEq total) by mouth daily. Patient not taking: Reported on 10/04/2020 03/02/20   Bonnielee Haff, MD  pseudoephedrine (SUDAFED) 30 MG tablet Take 30 mg by mouth every 4 (four) hours as needed for congestion.    [provider]  RA Astrid Drafts CAPS Take 800 mg 2 tablets twice a day 10/04/20   [name], [name], MD  rosuvastatin (CRESTOR) 10 MG tablet Take 10 mg by mouth daily.     [provider]  STIOLTO RESPIMAT 2.5-2.5 MCG/ACT AERS Inhale 2 puffs into the lungs daily. 03/19/18   [provider]  tamsulosin (FLOMAX) 0.4 MG CAPS capsule Take 0.4 mg by mouth at bedtime. 07/07/19   [provider]  Testosterone Undecanoate 237 MG CAPS Take 1 capsule by mouth 2 (two) times daily.    [provider]  vitamin B-12 (CYANOCOBALAMIN) 1000 MCG tablet Take 1,000 mcg by mouth daily.    [provider]  Zinc 50 MG CAPS Take 50 mg by mouth daily.     [provider]    Allergies    Metoprolol and Imdur [isosorbide nitrate]  Review of Systems   Review of Systems  Cardiovascular:  Positive for chest pain.  All other systems reviewed and are negative.  Physical Exam Updated Vital Signs BP 140/89   Pulse 82   Temp 98.4 F (36.9 C)   Resp 19   Ht 5\' 10"  (1.778  m)   Wt 77.1 kg   SpO2 92%   BMI 24.39 kg/m   Physical Exam Vitals and nursing note reviewed.  65 year old male, resting comfortably and in no acute distress. Vital signs are normal. Oxygen saturation is 92%, which is normal. Head is normocephalic and atraumatic. PERRLA, EOMI. Oropharynx is clear. Neck is nontender and supple without adenopathy or JVD. Back is nontender and there is no CVA tenderness. Lungs are clear without rales, wheezes, or rhonchi. Chest is nontender. Heart has regular rate and rhythm without murmur. Abdomen is soft, flat, nontender. Extremities have no cyanosis or edema, full range of motion is present. Skin is warm and dry  without rash. Neurologic: Mental status is normal, cranial nerves are intact, moves all extremities equally.  ED Results / Procedures / Treatments   Labs (all labs ordered are listed, but only abnormal results are displayed) Labs Reviewed  BASIC METABOLIC PANEL - Abnormal; Notable for the following components:      Result Value   Glucose, Bld 101 (*)    All other components within normal limits  CBC - Abnormal; Notable for the following components:   MCH 34.6 (*)    All other components within normal limits  TROPONIN I (HIGH SENSITIVITY)  TROPONIN I (HIGH SENSITIVITY)    EKG EKG Interpretation  Date/Time:  Thursday June 19 2021 19:15:32 EDT Ventricular Rate:  79 PR Interval:  180 QRS Duration: 76 QT Interval:  356 QTC Calculation: 408 R Axis:   84 Text Interpretation: Normal sinus rhythm Normal ECG When compared with ECG of 02/29/2020, Premature atrial complexes are no longer present Confirmed by Delora Fuel ([id number]) on 06/19/2021 10:59:50 PM  Radiology DG Chest 2 View  Result Date: 06/19/2021 CLINICAL DATA:  Chest pain EXAM: CHEST - 2 VIEW COMPARISON:  02/29/2020 FINDINGS: The lungs are clear without focal pneumonia, edema, pneumothorax or pleural effusion. Nodular density/densities projecting over the lungs are  compatible with pads for telemetry leads. The cardiopericardial silhouette is within normal limits for size. The visualized bony structures of the thorax show no acute abnormality. IMPRESSION: No active cardiopulmonary disease. Electronically Signed   By: Misty Stanley M.D.   On: 06/19/2021 19:58    Procedures Procedures   Medications Ordered in ED Medications  aspirin tablet 325 mg (325 mg Oral Given 06/19/21 2349)    ED Course  I have reviewed the triage vital signs and the nursing notes.  Pertinent labs & imaging results that were available during my care of the patient were reviewed by me and considered in my medical decision making (see chart for details).   MDM Rules/Calculators/A&P                         Chest pain of uncertain cause.  ECG shows no acute changes.  Chest x-ray shows no acute changes.  Troponin is normal x2 and he has been pain-free for 6 hours in the emergency department.  Cause of pain is not clear, but no evidence of myocardial infarction.  He is considered to be safe for discharge and is referred back to his primary care provider for follow-up.  Old records are reviewed, confirming admission for non-STEMI in 2013 with stent placement.  Final Clinical Impression(s) / ED Diagnoses Final diagnoses:  Nonspecific chest pain    Rx / DC Orders ED Discharge Orders     None        Delora Fuel, MD 56/25/63 0147

## 2022-05-04 ENCOUNTER — Encounter: Payer: Self-pay | Admitting: Emergency Medicine

## 2022-05-04 ENCOUNTER — Ambulatory Visit
Admission: EM | Admit: 2022-05-04 | Discharge: 2022-05-04 | Disposition: A | Discharge status: Home / Self Care | Payer: Managed Care, Other (non HMO) | Attending: Family Medicine | Admitting: Family Medicine

## 2022-05-04 DIAGNOSIS — H5712 Ocular pain, left eye: Secondary | ICD-10-CM | POA: Diagnosis not present

## 2022-05-04 NOTE — Discharge Instructions (Signed)
See your eye doctor as soon as they are available Using refresh drops.  Consider getting an eyedrop that is of thicker consistency

## 2022-05-04 NOTE — ED Triage Notes (Signed)
Patient presents to Urgent Care with complaints of left eye pain since 2-3 days. Patient reports left eye pain comes and goes. Denies any changes with vision. Denies any injury to the eye.

## 2022-05-04 NOTE — ED Provider Notes (Signed)
[name]    CSN: [id number] Arrival date & time: 05/04/22  1643      History   Chief Complaint Chief Complaint  Patient presents with   Eye Pain    HPI Melvin Martin is a 66 y.o. male.   HPI  Patient is seen for eye pain.  He has had eye pain for 2 to 3 days.  It is left eye only.  It comes in waves more severe and less severe.  No problems with vision.  No eye discharge.  No photophobia.  No trauma.  No other illness, runny nose or fever.  No rash.  He has never had eye pain like this before.  He does have an eye doctor who could not see him until Wednesday.  He is here to make sure there is nothing serious going on. Patient has chronic lymphocytic leukemia He also has emphysema He has hyperlipidemia, and has had a myocardial infarction  Past Medical History:  Diagnosis Date   ADD (attention deficit disorder)    Asthmatic bronchitis    Cancer ([place])    Chest pain    pt relates to GERDS and asthmatic bronchitis   CLL (chronic lymphocytic leukemia) (HCC)    COPD (chronic obstructive pulmonary disease) (HCC)    Coronary artery disease    GERD (gastroesophageal reflux disease)    Hyperlipidemia LDL goal < 70    Low testosterone    NSTEMI (non-ST elevated myocardial infarction) [facility]) December 2013   with PCI with DES to the ramus and LAD    Patient Active Problem List   Diagnosis Date Noted   Acute respiratory failure with hypoxia ([place]) 02/29/2020   Centrilobular emphysema ([place]) 02/29/2020   BPH (benign prostatic hyperplasia) 02/29/2020   Hyperkalemia 03/25/2018   Chest pain 03/25/2018   COPD (chronic obstructive pulmonary disease) ([place])    CLL (chronic lymphocytic leukemia) ([place]) 01/06/2017   Coronary artery disease    Non-ST elevation myocardial infarction (NSTEMI), initial care episode ([place]) 08/18/2012   GERD (gastroesophageal reflux disease)    Dyslipidemia (high LDL; low HDL)     Past Surgical History:  Procedure Laterality Date   CORONARY  ANGIOPLASTY WITH STENT PLACEMENT  08/18/2012   DES to the ramus and LAD   Cyst removal, mouth     LEFT HEART CATHETERIZATION WITH CORONARY ANGIOGRAM N/A 08/18/2012   Procedure: LEFT HEART CATHETERIZATION WITH CORONARY ANGIOGRAM;  Surgeon: Sherren Mocha, MD;  Location: [facility] CATH LAB;  Service: Cardiovascular;  Laterality: N/A;   PERCUTANEOUS CORONARY STENT INTERVENTION (PCI-S)  08/18/2012   Procedure: PERCUTANEOUS CORONARY STENT INTERVENTION (PCI-S);  Surgeon: Sherren Mocha, MD;  Location: [facility] CATH LAB;  Service: Cardiovascular;;       Home Medications    Prior to Admission medications   Medication Sig Start Date End Date Taking? Authorizing Provider  acyclovir (ZOVIRAX) 400 MG tablet Take 400 mg by mouth 2 (two) times daily.  08/08/18  Yes [provider]  Ascorbic Acid (VITAMIN C WITH ROSE HIPS) 1000 MG tablet Take 1,000 mg by mouth daily.   Yes [provider]  Ascorbic Acid (VITAMIN C) 1000 MG tablet Take 2000 mg daily 10/04/20  Yes [name], [name], MD  aspirin 81 MG chewable tablet Chew 81 mg by mouth daily.  08/19/12  Yes [provider]  Azelastine HCl 137 MCG/SPRAY SOLN Use 1 spray twice a day 10/04/20  Yes [name], [name], MD  Ipratropium-Albuterol (COMBIVENT RESPIMAT) 20-100 MCG/ACT AERS respimat Inhale 1 puff into  the lungs every 6 (six) hours. 10/04/20  Yes [name], [name], MD  loratadine-pseudoephedrine (CLARITIN-D 12-HOUR) 5-120 MG tablet Take 1 tablet by mouth daily as needed for allergies.    Yes [provider]  Multiple Vitamins-Minerals (MULTIVITAMIN) tablet Take 1 tablet by mouth daily. 10/04/20  Yes [name], [name], MD  nitroGLYCERIN (NITROSTAT) 0.4 MG SL tablet DISSOLVE 1 TABLET UNDER THE TONGUE EVERY 5 MINUTES AS NEEDED FOR CHEST PAIN 05/16/21  Yes [name], [name], MD  omeprazole (PRILOSEC) 40 MG capsule Take 40 mg by mouth daily.   Yes [provider]  pseudoephedrine (SUDAFED) 30 MG tablet Take 30 mg by mouth every 4 (four) hours  as needed for congestion.   Yes [provider]  RA Astrid Drafts CAPS Take 800 mg 2 tablets twice a day 10/04/20  Yes [name], [name], MD  rosuvastatin (CRESTOR) 10 MG tablet Take 10 mg by mouth daily.    Yes [provider]  STIOLTO RESPIMAT 2.5-2.5 MCG/ACT AERS Inhale 2 puffs into the lungs daily. 03/19/18  Yes [provider]  tamsulosin (FLOMAX) 0.4 MG CAPS capsule Take 0.4 mg by mouth at bedtime. 07/07/19  Yes [provider]  vitamin B-12 (CYANOCOBALAMIN) 1000 MCG tablet Take 1,000 mcg by mouth daily.   Yes [provider]  Zinc 50 MG CAPS Take 50 mg by mouth daily.    Yes [provider]    Family History Family History  Problem Relation Age of Onset   Emphysema Mother    Heart failure Father    Heart attack Father    Heart attack Cousin     Social History Social History   Tobacco Use   Smoking status: Never   Smokeless tobacco: Never  Vaping Use   Vaping Use: Never used  Substance Use Topics   Alcohol use: No   Drug use: No     Allergies   Metoprolol and Imdur [isosorbide nitrate]   Review of Systems Review of Systems See HPI  Physical Exam Triage Vital Signs ED Triage Vitals  Enc Vitals Group     BP 05/04/22 1723 114/78     Pulse Rate 05/04/22 1723 75     Resp 05/04/22 1723 16     Temp 05/04/22 1723 98 F (36.7 C)     Temp Source 05/04/22 1723 Oral     SpO2 05/04/22 1723 94 %     Weight --      Height --      Head Circumference --      Peak Flow --      Pain Score 05/04/22 1720 6     Pain Loc --      Pain Edu? --      Excl. in [place]? --    No data found.  Updated Vital Signs BP 114/78 (BP Location: Left Arm)   Pulse 75   Temp 98 F (36.7 C) (Oral)   Resp 16   SpO2 94%       Physical Exam Constitutional:      General: He is not in acute distress.    Appearance: He is well-developed. He is not ill-appearing.  HENT:     Head: Normocephalic and atraumatic.  Eyes:     Conjunctiva/sclera:  Conjunctivae normal.     Pupils: Pupils are equal, round, and reactive to light.     Comments: Patient's eyes appear symmetric.  Pupils are equal and reactive.  EOMI.  No conjunctival injection.  Eyelids are everted.  No foreign body is seen.  Funduscopic exam benign.  Fluorescein is applied after tetracaine anesthesia.  There is no uptake.  Eye is rinsed with eyewash.  Patient does state that after the fluorescein his eye pain is gone.  Palpation of eye reveals no tenderness.  There is no temporal artery tenderness  Cardiovascular:     Rate and Rhythm: Normal rate.  Pulmonary:     Effort: Pulmonary effort is normal. No respiratory distress.  Abdominal:     General: There is no distension.     Palpations: Abdomen is soft.  Musculoskeletal:        General: Normal range of motion.     Cervical back: Normal range of motion.  Skin:    General: Skin is warm and dry.  Neurological:     Mental Status: He is alert.     Gait: Gait normal.  Psychiatric:        Mood and Affect: Mood normal.        Behavior: Behavior normal.      UC Treatments / Results  Labs (all labs ordered are listed, but only abnormal results are displayed) Labs Reviewed - No data to display  EKG   Radiology No results found.  Procedures Procedures (including critical care time)  Medications Ordered in UC Medications - No data to display  Initial Impression / Assessment and Plan / UC Course  I have reviewed the triage vital signs and the nursing notes.  Pertinent labs & imaging results that were available during my care of the patient were reviewed by me and considered in my medical decision making (see chart for details).     Discussed with patient that IV is difficult to fully evaluate at the urgent care center since I do not have tonometry and I do not have a slit-lamp.  I still think he needs to see his eye doctor.  He can wait until day after tomorrow unless he becomes worse or has any visual change.   He states that he has checked for glaucoma yearly and has had no increased eye pressure.  Told him that I do not see any major illness or vision risk disease Final Clinical Impressions(s) / UC Diagnoses   Final diagnoses:  Left eye pain     Discharge Instructions      See your eye doctor as soon as they are available Using refresh drops.  Consider getting an eyedrop that is of thicker consistency    ED Prescriptions   None    PDMP not reviewed this encounter.   Raylene Everts, MD 05/04/22 [phone]

## 2022-05-05 ENCOUNTER — Telehealth: Payer: Self-pay | Admitting: Emergency Medicine

## 2022-11-03 ENCOUNTER — Ambulatory Visit
Admission: EM | Admit: 2022-11-03 | Discharge: 2022-11-03 | Disposition: A | Discharge status: Home / Self Care | Payer: Managed Care, Other (non HMO) | Attending: Family Medicine | Admitting: Family Medicine

## 2022-11-03 DIAGNOSIS — U071 COVID-19: Secondary | ICD-10-CM

## 2022-11-03 DIAGNOSIS — Z23 Encounter for immunization: Secondary | ICD-10-CM | POA: Diagnosis not present

## 2022-11-03 DIAGNOSIS — W540XXA Bitten by dog, initial encounter: Secondary | ICD-10-CM

## 2022-11-03 DIAGNOSIS — S61451A Open bite of right hand, initial encounter: Secondary | ICD-10-CM

## 2022-11-03 DIAGNOSIS — C9110 Chronic lymphocytic leukemia of B-cell type not having achieved remission: Secondary | ICD-10-CM | POA: Diagnosis not present

## 2022-11-03 MED ORDER — HYDROCODONE-ACETAMINOPHEN 7.5-325 MG PO TABS: 1.0000 | ORAL_TABLET | Freq: Four times a day (QID) | ORAL | 0 refills | Status: AC | PRN

## 2022-11-03 MED ORDER — TETANUS-DIPHTH-ACELL PERTUSSIS 5-2.5-18.5 LF-MCG/0.5 IM SUSY
0.5000 mL | PREFILLED_SYRINGE | Freq: Once | INTRAMUSCULAR | Status: AC
  Administered 2022-11-03: 0.5 mL via INTRAMUSCULAR

## 2022-11-03 MED ORDER — AMOXICILLIN-POT CLAVULANATE 875-125 MG PO TABS: 1.0000 | ORAL_TABLET | Freq: Two times a day (BID) | ORAL | 0 refills | Status: DC

## 2022-11-03 NOTE — ED Notes (Signed)
R hand - dog bite - known to pt- daughter's dog - rabies vaccine up to date- [place] bite form completed by daughter - pt's hand soaking in hibiclens wash, will dress hand in kerlix in  approx 15 min

## 2022-11-03 NOTE — Discharge Instructions (Addendum)
Wash once a day and apply dressing Take the antibiotic Augmentin 2 times a day. Take Tylenol for moderate pain.  Take Tylenol with hydrocodone for severe pain.  Do not drive on hydrocodone. Elevate your hand to reduce pain and swelling  See your PCP, or return here for any increased symptoms or complications

## 2022-11-03 NOTE — ED Triage Notes (Signed)
Pt (currently pos for COVID) here today for dog bite that occurred this morning.  Two of his own dogs got into a fight and he tried to break them up and got bit. Puncture wounds to RT index. Swelling noted.

## 2022-11-03 NOTE — ED Provider Notes (Signed)
[facility] C[facility]    CSN: [id number] Arrival date & time: 11/03/22  1523      History   Chief Complaint Chief Complaint  Patient presents with   Animal Bite    RT hand    HPI Melvin Martin is a 67 y.o. male.   HPI  Patient is known to me from prior visits.  Pleasant 67 year old gentleman with multiple medical problems including history of CLL, COPD, coronary artery disease status post MI.  Currently under treatment for COVID.  Was diagnosed Friday  2/23.  Is taking molnupiravir.  He has 3 dogs that live in the home.  One of them is a pitbull who "used to be a bait dog".  This dog bites with their other dogs.  This morning there was a dog bite and patient tried to break them up.  He has bites to his right hand that need evaluation  Past Medical History:  Diagnosis Date   ADD (attention deficit disorder)    Asthmatic bronchitis    Cancer ([place])    Chest pain    pt relates to GERDS and asthmatic bronchitis   CLL (chronic lymphocytic leukemia) (HCC)    COPD (chronic obstructive pulmonary disease) (HCC)    Coronary artery disease    GERD (gastroesophageal reflux disease)    Hyperlipidemia LDL goal < 70    Low testosterone    NSTEMI (non-ST elevated myocardial infarction) [facility]) December 2013   with PCI with DES to the ramus and LAD    Patient Active Problem List   Diagnosis Date Noted   Acute respiratory failure with hypoxia ([place]) 02/29/2020   Centrilobular emphysema ([place]) 02/29/2020   BPH (benign prostatic hyperplasia) 02/29/2020   Hyperkalemia 03/25/2018   Chest pain 03/25/2018   COPD (chronic obstructive pulmonary disease) ([place])    CLL (chronic lymphocytic leukemia) ([place]) 01/06/2017   Coronary artery disease    Non-ST elevation myocardial infarction (NSTEMI), initial care episode ([place]) 08/18/2012   GERD (gastroesophageal reflux disease)    Dyslipidemia (high LDL; low HDL)     Past Surgical History:  Procedure Laterality Date   CORONARY ANGIOPLASTY WITH  STENT PLACEMENT  08/18/2012   DES to the ramus and LAD   Cyst removal, mouth     LEFT HEART CATHETERIZATION WITH CORONARY ANGIOGRAM N/A 08/18/2012   Procedure: LEFT HEART CATHETERIZATION WITH CORONARY ANGIOGRAM;  Surgeon: Sherren Mocha, MD;  Location: [facility] CATH LAB;  Service: Cardiovascular;  Laterality: N/A;   PERCUTANEOUS CORONARY STENT INTERVENTION (PCI-S)  08/18/2012   Procedure: PERCUTANEOUS CORONARY STENT INTERVENTION (PCI-S);  Surgeon: Sherren Mocha, MD;  Location: [facility] CATH LAB;  Service: Cardiovascular;;       Home Medications    Prior to Admission medications   Medication Sig Start Date End Date Taking? Authorizing Provider  amoxicillin-clavulanate (AUGMENTIN) 875-125 MG tablet Take 1 tablet by mouth every 12 (twelve) hours. 11/03/22  Yes Raylene Everts, MD  HYDROcodone-acetaminophen [facility]) 7.5-325 MG tablet Take 1 tablet by mouth every 6 (six) hours as needed for moderate pain. 11/03/22  Yes Raylene Everts, MD  LAGEVRIO 200 MG CAPS capsule SMARTSIG:4 Capsule(s) By Mouth 10/30/22  Yes [provider]  acyclovir (ZOVIRAX) 400 MG tablet Take 400 mg by mouth 2 (two) times daily.  08/08/18   [provider]  Ascorbic Acid (VITAMIN C WITH ROSE HIPS) 1000 MG tablet Take 1,000 mg by mouth daily.    [provider]  aspirin 81 MG chewable tablet Chew 81 mg by mouth daily.  08/19/12   [provider]  Azelastine HCl 137 MCG/SPRAY SOLN Use 1 spray twice a day 10/04/20   [name], [name], MD  Ipratropium-Albuterol (COMBIVENT RESPIMAT) 20-100 MCG/ACT AERS respimat Inhale 1 puff into the lungs every 6 (six) hours. 10/04/20   [name], [name], MD  Multiple Vitamins-Minerals (MULTIVITAMIN) tablet Take 1 tablet by mouth daily. 10/04/20   [name], [name], MD  nitroGLYCERIN (NITROSTAT) 0.4 MG SL tablet DISSOLVE 1 TABLET UNDER THE TONGUE EVERY 5 MINUTES AS NEEDED FOR CHEST PAIN 05/16/21   [name], [name], MD  omeprazole (PRILOSEC) 40 MG capsule Take 40 mg by mouth  daily.    [provider]  RA Astrid Drafts CAPS Take 800 mg 2 tablets twice a day 10/04/20   [name], [name], MD  rosuvastatin (CRESTOR) 10 MG tablet Take 10 mg by mouth daily.     [provider]  STIOLTO RESPIMAT 2.5-2.5 MCG/ACT AERS Inhale 2 puffs into the lungs daily. 03/19/18   [provider]  vitamin B-12 (CYANOCOBALAMIN) 1000 MCG tablet Take 1,000 mcg by mouth daily.    [provider]  Zinc 50 MG CAPS Take 50 mg by mouth daily.     [provider]    Family History Family History  Problem Relation Age of Onset   Emphysema Mother    Heart failure Father    Heart attack Father    Heart attack Cousin     Social History Social History   Tobacco Use   Smoking status: Never   Smokeless tobacco: Never  Vaping Use   Vaping Use: Never used  Substance Use Topics   Alcohol use: No   Drug use: No     Allergies   Metoprolol and Imdur [isosorbide nitrate]   Review of Systems Review of Systems  See HPI Physical Exam Triage Vital Signs ED Triage Vitals  Enc Vitals Group     BP 11/03/22 1536 118/82     Pulse Rate 11/03/22 1536 82     Resp 11/03/22 1536 17     Temp 11/03/22 1536 (!) 97.5 F (36.4 C)     Temp Source 11/03/22 1536 Oral     SpO2 11/03/22 1536 95 %     Weight --      Height --      Head Circumference --      Peak Flow --      Pain Score 11/03/22 1537 8     Pain Loc --      Pain Edu? --      Excl. in [place]? --    No data found.  Updated Vital Signs BP 118/82 (BP Location: Left Arm)   Pulse 82   Temp (!) 97.5 F (36.4 C) (Oral)   Resp 17   SpO2 95%       Physical Exam Constitutional:      General: He is not in acute distress.    Appearance: He is well-developed.  HENT:     Head: Normocephalic and atraumatic.  Eyes:     Conjunctiva/sclera: Conjunctivae normal.     Pupils: Pupils are equal, round, and reactive to light.  Cardiovascular:     Rate and Rhythm: Normal rate.  Pulmonary:     Effort:  Pulmonary effort is normal. No respiratory distress.  Abdominal:     General: There is no distension.     Palpations: Abdomen is soft.  Musculoskeletal:        General: Swelling, tenderness and signs of  injury present. Normal range of motion.     Cervical back: Normal range of motion.     Comments: The right hand has soft tissue swelling across the dorsum, more towards the radial side.  There are multiple puncture wounds of the hand in this region.  The deepest is near the index finger MCP area.  There is no numbness of the fingers.  Patient has flexion extension of all fingers.  No suspected nerve or tendon involvement.  No bony tenderness  Skin:    General: Skin is warm and dry.  Neurological:     Mental Status: He is alert.      UC Treatments / Results  Labs (all labs ordered are listed, but only abnormal results are displayed) Labs Reviewed - No data to display  EKG   Radiology No results found.  Procedures Procedures (including critical care time)  Medications Ordered in UC Medications  Tdap (BOOSTRIX) injection 0.5 mL (0.5 mLs Intramuscular Given 11/03/22 1605)    Initial Impression / Assessment and Plan / UC Course  I have reviewed the triage vital signs and the nursing notes.  Pertinent labs & imaging results that were available during my care of the patient were reviewed by me and considered in my medical decision making (see chart for details).     Final Clinical Impressions(s) / UC Diagnoses   Final diagnoses:  Dog bite, hand, right, initial encounter  COVID-19  CLL (chronic lymphocytic leukemia) ([place])     Discharge Instructions      Wash once a day and apply dressing Take the antibiotic Augmentin 2 times a day. Take Tylenol for moderate pain.  Take Tylenol with hydrocodone for severe pain.  Do not drive on hydrocodone. Elevate your hand to reduce pain and swelling  See your PCP, or return here for any increased symptoms or complications    ED  Prescriptions     Medication Sig Dispense Auth. Provider   amoxicillin-clavulanate (AUGMENTIN) 875-125 MG tablet Take 1 tablet by mouth every 12 (twelve) hours. 14 tablet Raylene Everts, MD   HYDROcodone-acetaminophen [facility]) 7.5-325 MG tablet Take 1 tablet by mouth every 6 (six) hours as needed for moderate pain. 10 tablet Raylene Everts, MD      I have reviewed the PDMP during this encounter.   Raylene Everts, MD 11/03/22 [id number]

## 2023-03-29 ENCOUNTER — Ambulatory Visit
Admission: RE | Admit: 2023-03-29 | Discharge: 2023-03-29 | Disposition: A | Discharge status: Home / Self Care | Payer: Managed Care, Other (non HMO) | Source: Ambulatory Visit | Attending: Family Medicine | Admitting: Family Medicine

## 2023-03-29 VITALS — BP 127/91 | HR 64 | Temp 97.7°F | Resp 16

## 2023-03-29 DIAGNOSIS — R059 Cough, unspecified: Secondary | ICD-10-CM | POA: Diagnosis not present

## 2023-03-29 DIAGNOSIS — J069 Acute upper respiratory infection, unspecified: Secondary | ICD-10-CM | POA: Diagnosis not present

## 2023-03-29 MED ORDER — AZITHROMYCIN 250 MG PO TABS: 250.0000 mg | ORAL_TABLET | Freq: Every day | ORAL | 0 refills | Status: DC

## 2023-03-29 MED ORDER — PREDNISONE 20 MG PO TABS: ORAL_TABLET | ORAL | 0 refills | Status: DC

## 2023-03-29 NOTE — Discharge Instructions (Addendum)
Advised patient to take medication as directed with food to completion.  Advised patient to start prednisone tomorrow morning, Tuesday, 03/30/2023.  Advised if cough worsens after 4 to 5 days may start Zithromax.  Advised if taking this medication please take to completion.  Encouraged increase daily water intake to 64 ounces per day while taking these medications.  Advised if symptoms worsen and/or unresolved please follow-up with PCP or here for further evaluation.

## 2023-03-29 NOTE — ED Provider Notes (Signed)
Ivar D[facility]    CSN: [id number] Arrival date & time: 03/29/23  1743      History   Chief Complaint Chief Complaint  Patient presents with   Cough    HPI Melvin Martin is a 67 y.o. male.   HPI 67 year old male presents with cough that started yesterday.  PMH as significant for CLL, COPD, and CAD s/p non-STEMI.  Past Medical History:  Diagnosis Date   ADD (attention deficit disorder)    Asthmatic bronchitis    Cancer (HCC)    Chest pain    pt relates to GERDS and asthmatic bronchitis   CLL (chronic lymphocytic leukemia) (HCC)    COPD (chronic obstructive pulmonary disease) (HCC)    Coronary artery disease    GERD (gastroesophageal reflux disease)    Hyperlipidemia LDL goal < 70    Low testosterone    NSTEMI (non-ST elevated myocardial infarction) [facility]) December 2013   with PCI with DES to the ramus and LAD    Patient Active Problem List   Diagnosis Date Noted   Acute respiratory failure with hypoxia (HCC) 02/29/2020   Centrilobular emphysema (HCC) 02/29/2020   BPH (benign prostatic hyperplasia) 02/29/2020   Hyperkalemia 03/25/2018   Chest pain 03/25/2018   COPD (chronic obstructive pulmonary disease) (HCC)    CLL (chronic lymphocytic leukemia) (HCC) 01/06/2017   Coronary artery disease    Non-ST elevation myocardial infarction (NSTEMI), initial care episode (HCC) 08/18/2012   GERD (gastroesophageal reflux disease)    Dyslipidemia (high LDL; low HDL)     Past Surgical History:  Procedure Laterality Date   CORONARY ANGIOPLASTY WITH STENT PLACEMENT  08/18/2012   DES to the ramus and LAD   Cyst removal, mouth     LEFT HEART CATHETERIZATION WITH CORONARY ANGIOGRAM N/A 08/18/2012   Procedure: LEFT HEART CATHETERIZATION WITH CORONARY ANGIOGRAM;  Surgeon: Tonny Bollman, MD;  Location: [facility] CATH LAB;  Service: Cardiovascular;  Laterality: N/A;   PERCUTANEOUS CORONARY STENT INTERVENTION (PCI-S)  08/18/2012   Procedure: PERCUTANEOUS CORONARY STENT  INTERVENTION (PCI-S);  Surgeon: Tonny Bollman, MD;  Location: [facility] CATH LAB;  Service: Cardiovascular;;       Home Medications    Prior to Admission medications   Medication Sig Start Date End Date Taking? Authorizing Provider  azithromycin (ZITHROMAX) 250 MG tablet Take 1 tablet (250 mg total) by mouth daily. Take first 2 tablets together, then 1 every day until finished. 03/29/23  Yes Trevor Iha, FNP  predniSONE (DELTASONE) 20 MG tablet Take 3 tabs PO daily x 5 days. 03/29/23  Yes Trevor Iha, FNP  acyclovir (ZOVIRAX) 400 MG tablet Take 400 mg by mouth 2 (two) times daily.  08/08/18   [provider]  amoxicillin-clavulanate (AUGMENTIN) 875-125 MG tablet Take 1 tablet by mouth every 12 (twelve) hours. 11/03/22   Eustace Moore, MD  Ascorbic Acid (VITAMIN C WITH ROSE HIPS) 1000 MG tablet Take 1,000 mg by mouth daily.    [provider]  aspirin 81 MG chewable tablet Chew 81 mg by mouth daily.  08/19/12   [provider]  Azelastine HCl 137 MCG/SPRAY SOLN Use 1 spray twice a day 10/04/20   [place], [name], MD  HYDROcodone-acetaminophen [facility]) 7.5-325 MG tablet Take 1 tablet by mouth every 6 (six) hours as needed for moderate pain. 11/03/22   Eustace Moore, MD  Ipratropium-Albuterol (COMBIVENT RESPIMAT) 20-100 MCG/ACT AERS respimat Inhale 1 puff into the lungs every 6 (six) hours. 10/04/20   [place], [name], MD  Rexene Agent  200 MG CAPS capsule SMARTSIG:4 Capsule(s) By Mouth 10/30/22   [provider]  Multiple Vitamins-Minerals (MULTIVITAMIN) tablet Take 1 tablet by mouth daily. 10/04/20   [place], [name], MD  nitroGLYCERIN (NITROSTAT) 0.4 MG SL tablet DISSOLVE 1 TABLET UNDER THE TONGUE EVERY 5 MINUTES AS NEEDED FOR CHEST PAIN 05/16/21   [place], [name], MD  omeprazole (PRILOSEC) 40 MG capsule Take 40 mg by mouth daily.    [provider]  RA [facility] CAPS Take 800 mg 2 tablets twice a day 10/04/20   [place], [name], MD  rosuvastatin (CRESTOR) 10  MG tablet Take 10 mg by mouth daily.     [provider]  STIOLTO RESPIMAT 2.5-2.5 MCG/ACT AERS Inhale 2 puffs into the lungs daily. 03/19/18   [provider]  vitamin B-12 (CYANOCOBALAMIN) 1000 MCG tablet Take 1,000 mcg by mouth daily.    [provider]  Zinc 50 MG CAPS Take 50 mg by mouth daily.     [provider]    Family History Family History  Problem Relation Age of Onset   Emphysema Mother    Heart failure Father    Heart attack Father    Heart attack Cousin     Social History Social History   Tobacco Use   Smoking status: Never   Smokeless tobacco: Never  Vaping Use   Vaping status: Never Used  Substance Use Topics   Alcohol use: No   Drug use: No     Allergies   Metoprolol and Imdur [isosorbide nitrate]   Review of Systems Review of Systems   Physical Exam Triage Vital Signs ED Triage Vitals  Encounter Vitals Group     BP 03/29/23 1804 (!) 127/91     Systolic BP Percentile --      Diastolic BP Percentile --      Pulse Rate 03/29/23 1804 64     Resp 03/29/23 1804 16     Temp 03/29/23 1804 97.7 F (36.5 C)     Temp src --      SpO2 03/29/23 1804 98 %     Weight --      Height --      Head Circumference --      Peak Flow --      Pain Score 03/29/23 1803 0     Pain Loc --      Pain Education --      Exclude from Growth Chart --    No data found.  Updated Vital Signs BP (!) 127/91   Pulse 64   Temp 97.7 F (36.5 C)   Resp 16   SpO2 98%   Physical Exam Vitals and nursing note reviewed.  Constitutional:      Appearance: Normal appearance. He is normal weight.  HENT:     Head: Normocephalic and atraumatic.     Right Ear: Tympanic membrane, ear canal and external ear normal.     Left Ear: Tympanic membrane, ear canal and external ear normal.     Nose: Nose normal.     Mouth/Throat:     Mouth: Mucous membranes are moist.     Pharynx: Oropharynx is clear.  Eyes:     Extraocular Movements:  Extraocular movements intact.     Conjunctiva/sclera: Conjunctivae normal.     Pupils: Pupils are equal, round, and reactive to light.  Cardiovascular:     Rate and Rhythm: Normal rate and regular rhythm.     Pulses: Normal pulses.  Heart sounds: Normal heart sounds.  Pulmonary:     Effort: Pulmonary effort is normal.     Breath sounds: Normal breath sounds. No wheezing, rhonchi or rales.     Comments: Infrequent nonproductive cough noted on exam Musculoskeletal:        General: Normal range of motion.     Cervical back: Normal range of motion and neck supple.  Skin:    General: Skin is warm and dry.  Neurological:     General: No focal deficit present.     Mental Status: He is alert and oriented to person, place, and time. Mental status is at baseline.  Psychiatric:        Mood and Affect: Mood normal.        Behavior: Behavior normal.        Thought Content: Thought content normal.      UC Treatments / Results  Labs (all labs ordered are listed, but only abnormal results are displayed) Labs Reviewed - No data to display  EKG   Radiology No results found.  Procedures Procedures (including critical care time)  Medications Ordered in UC Medications - No data to display  Initial Impression / Assessment and Plan / UC Course  I have reviewed the triage vital signs and the nursing notes.  Pertinent labs & imaging results that were available during my care of the patient were reviewed by me and considered in my medical decision making (see chart for details).     MDM: 1.  Acute URI-Rx'd Zithromax (500 mg day 1, then 250 mg daily x 4 days); 2.  Cough, unspecified type-Rx'd prednisone 60 mg daily x 5 days. Advised patient to take medication as directed with food to completion.  Advised patient to start prednisone tomorrow morning, Tuesday, 03/30/2023.  Advised if cough worsens after 4 to 5 days may start Zithromax.  Advised if taking this medication please take to  completion.  Encouraged increase daily water intake to 64 ounces per day while taking these medications.  Advised if symptoms worsen and/or unresolved please follow-up with PCP or here for further evaluation.  Patient discharged home, hemodynamically stable. Final Clinical Impressions(s) / UC Diagnoses   Final diagnoses:  Cough, unspecified type  Acute URI     Discharge Instructions      Advised patient to take medication as directed with food to completion.  Advised patient to start prednisone tomorrow morning, Tuesday, 03/30/2023.  Advised if cough worsens after 4 to 5 days may start Zithromax.  Advised if taking this medication please take to completion.  Encouraged increase daily water intake to 64 ounces per day while taking these medications.  Advised if symptoms worsen and/or unresolved please follow-up with PCP or here for further evaluation.     ED Prescriptions     Medication Sig Dispense Auth. Provider   predniSONE (DELTASONE) 20 MG tablet Take 3 tabs PO daily x 5 days. 15 tablet Trevor Iha, FNP   azithromycin (ZITHROMAX) 250 MG tablet Take 1 tablet (250 mg total) by mouth daily. Take first 2 tablets together, then 1 every day until finished. 6 tablet Trevor Iha, FNP      PDMP not reviewed this encounter.   Trevor Iha, FNP 03/29/23 1859

## 2023-03-29 NOTE — ED Triage Notes (Addendum)
Pt presents to uc with co of cough starting up yesterday. Pt reports he has pmh of emphysema and cancer and it is difficult for him to cough. Pt reports he has been taking vit c, zinc, and increased his prn inhaler use. He has also attempted musinex. He is scheduled to see pulmonologist in aug.

## 2024-06-10 ENCOUNTER — Other Ambulatory Visit: Payer: Self-pay

## 2024-06-10 ENCOUNTER — Ambulatory Visit
Admission: RE | Admit: 2024-06-10 | Discharge: 2024-06-10 | Disposition: A | Discharge status: Home / Self Care | Source: Ambulatory Visit

## 2024-06-10 VITALS — BP 118/86 | HR 81 | Temp 97.9°F | Resp 19

## 2024-06-10 DIAGNOSIS — H6123 Impacted cerumen, bilateral: Secondary | ICD-10-CM | POA: Diagnosis not present

## 2024-06-10 DIAGNOSIS — R059 Cough, unspecified: Secondary | ICD-10-CM

## 2024-06-10 DIAGNOSIS — J069 Acute upper respiratory infection, unspecified: Secondary | ICD-10-CM | POA: Diagnosis not present

## 2024-06-10 MED ORDER — AZITHROMYCIN 250 MG PO TABS: 250.0000 mg | ORAL_TABLET | Freq: Every day | ORAL | 0 refills | Status: AC

## 2024-06-10 MED ORDER — PREDNISONE 20 MG PO TABS: ORAL_TABLET | ORAL | 0 refills | Status: AC

## 2024-06-10 NOTE — ED Provider Notes (Signed)
 [name]    CSN: [id number] Arrival date & time: 06/10/24  0920      History   Chief Complaint Chief Complaint  Patient presents with   Cough    HPI RUDRA HOBBINS is a 68 y.o. male.   HPI Very pleasant 68 year old male presents with cough for 1 week.  Patient reports being evaluated by his pulmonologist on Tuesday, 06/06/2024 and reports lungs were clear,; however, patient reports shortness of breath and fatigue have worsened since.  PMH significant for CLL, asthmatic bronchitis, and CAD (s/p non-STEMI).  Past Medical History:  Diagnosis Date   ADD (attention deficit disorder)    Asthmatic bronchitis    Cancer (HCC)    Chest pain    pt relates to GERDS and asthmatic bronchitis   CLL (chronic lymphocytic leukemia) (HCC)    COPD (chronic obstructive pulmonary disease) (HCC)    Coronary artery disease    GERD (gastroesophageal reflux disease)    Hyperlipidemia LDL goal < 70    Low testosterone    NSTEMI (non-ST elevated myocardial infarction) [facility]) December 2013   with PCI with DES to the ramus and LAD    Patient Active Problem List   Diagnosis Date Noted   Acute respiratory failure with hypoxia (HCC) 02/29/2020   Centrilobular emphysema (HCC) 02/29/2020   BPH (benign prostatic hyperplasia) 02/29/2020   Hyperkalemia 03/25/2018   Chest pain 03/25/2018   COPD (chronic obstructive pulmonary disease) (HCC)    CLL (chronic lymphocytic leukemia) (HCC) 01/06/2017   Coronary artery disease    Non-ST elevation myocardial infarction (NSTEMI), initial care episode (HCC) 08/18/2012   GERD (gastroesophageal reflux disease)    Dyslipidemia (high LDL; low HDL)     Past Surgical History:  Procedure Laterality Date   CORONARY ANGIOPLASTY WITH STENT PLACEMENT  08/18/2012   DES to the ramus and LAD   Cyst removal, mouth     LEFT HEART CATHETERIZATION WITH CORONARY ANGIOGRAM N/A 08/18/2012   Procedure: LEFT HEART CATHETERIZATION WITH CORONARY ANGIOGRAM;  Surgeon:  Ozell Fell, MD;  Location: [facility] CATH LAB;  Service: Cardiovascular;  Laterality: N/A;   PERCUTANEOUS CORONARY STENT INTERVENTION (PCI-S)  08/18/2012   Procedure: PERCUTANEOUS CORONARY STENT INTERVENTION (PCI-S);  Surgeon: Ozell Fell, MD;  Location: [facility] CATH LAB;  Service: Cardiovascular;;       Home Medications    Prior to Admission medications   Medication Sig Start Date End Date Taking? Authorizing Provider  azithromycin  (ZITHROMAX ) 250 MG tablet Take 1 tablet (250 mg total) by mouth daily. Take first 2 tablets together, then 1 every day until finished. 06/10/24  Yes Teddy Ozell, FNP  budesonide (PULMICORT) 0.5 MG/2ML nebulizer solution Inhale 0.5 mg into the lungs. 06/06/24 06/06/25 Yes [provider]  predniSONE  (DELTASONE ) 20 MG tablet Take 3 tabs PO daily x 5 days. 06/10/24  Yes Teddy Ozell, FNP  acyclovir  (ZOVIRAX ) 400 MG tablet Take 400 mg by mouth 2 (two) times daily.  08/08/18   [provider]  Ascorbic Acid  (VITAMIN C  WITH ROSE HIPS) 1000 MG tablet Take 1,000 mg by mouth daily.    [provider]  aspirin  81 MG chewable tablet Chew 81 mg by mouth daily.  08/19/12   [provider]  Azelastine  HCl 137 MCG/SPRAY SOLN Use 1 spray twice a day 10/04/20   [place], [name], MD  HYDROcodone -acetaminophen  (NORCO) 7.5-325 MG tablet Take 1 tablet by mouth every 6 (six) hours as needed for moderate pain. 11/03/22   Maranda Jamee Jacob, MD  Ipratropium-Albuterol  (COMBIVENT RESPIMAT) 20-100 MCG/ACT AERS respimat Inhale 1 puff into the lungs every 6 (six) hours. 10/04/20   [place], [name], MD  LAGEVRIO 200 MG CAPS capsule SMARTSIG:4 Capsule(s) By Mouth 10/30/22   [provider]  loratadine  (CLARITIN ) 10 MG tablet 1 tablet Orally Once a day    [provider]  Multiple Vitamins-Minerals (MULTIVITAMIN) tablet Take 1 tablet by mouth daily. 10/04/20   [place], [name], MD  nitroGLYCERIN  (NITROSTAT ) 0.4 MG SL tablet DISSOLVE 1 TABLET UNDER THE  TONGUE EVERY 5 MINUTES AS NEEDED FOR CHEST PAIN 05/16/21   [place], [name], MD  omeprazole (PRILOSEC) 40 MG capsule Take 40 mg by mouth daily.    [provider]  RA Anselm Frizzle CAPS Take 800 mg 2 tablets twice a day 10/04/20   [place], [name], MD  REPATHA SURECLICK 140 MG/ML SOAJ Inject into the skin.    [provider]  rosuvastatin  (CRESTOR ) 10 MG tablet Take 10 mg by mouth daily.     [provider]  STIOLTO RESPIMAT 2.5-2.5 MCG/ACT AERS Inhale 2 puffs into the lungs daily. 03/19/18   [provider]  vitamin B-12 (CYANOCOBALAMIN) 1000 MCG tablet Take 1,000 mcg by mouth daily.    [provider]  Zinc 50 MG CAPS Take 50 mg by mouth daily.     [provider]    Family History Family History  Problem Relation Age of Onset   Emphysema Mother    Heart failure Father    Heart attack Father    Heart attack Cousin     Social History Social History   Tobacco Use   Smoking status: Never   Smokeless tobacco: Never  Vaping Use   Vaping status: Never Used  Substance Use Topics   Alcohol use: No   Drug use: No     Allergies   Metoprolol  and Imdur  [isosorbide  nitrate]   Review of Systems Review of Systems  Respiratory:  Positive for cough.   All other systems reviewed and are negative.    Physical Exam Triage Vital Signs ED Triage Vitals  Encounter Vitals Group     BP      Girls Systolic BP Percentile      Girls Diastolic BP Percentile      Boys Systolic BP Percentile      Boys Diastolic BP Percentile      Pulse      Resp      Temp      Temp src      SpO2      Weight      Height      Head Circumference      Peak Flow      Pain Score      Pain Loc      Pain Education      Exclude from Growth Chart    No data found.  Updated Vital Signs BP 118/86   Pulse 81   Temp 97.9 F (36.6 C)   Resp 19   SpO2 94%   Visual Acuity Right Eye Distance:   Left Eye Distance:   Bilateral Distance:    Right Eye Near:    Left Eye Near:    Bilateral Near:     Physical Exam Vitals and nursing note reviewed.  Constitutional:      General: He is not in acute distress.    Appearance: Normal appearance. He is normal weight. He is ill-appearing.  HENT:  Head: Normocephalic and atraumatic.     Right Ear: External ear normal.     Left Ear: External ear normal.     Ears:     Comments: Bilateral EAC's occluded with cerumen unable to visualize either TM.  Post bilateral ear lavage: Bilateral EAC's remain occluded with several attempts including alligator forceps    Mouth/Throat:     Mouth: Mucous membranes are moist.     Pharynx: Oropharynx is clear.  Eyes:     Extraocular Movements: Extraocular movements intact.     Conjunctiva/sclera: Conjunctivae normal.     Pupils: Pupils are equal, round, and reactive to light.  Cardiovascular:     Rate and Rhythm: Normal rate and regular rhythm.     Pulses: Normal pulses.     Heart sounds: Normal heart sounds.  Pulmonary:     Effort: Pulmonary effort is normal.     Breath sounds: Normal breath sounds. No wheezing, rhonchi or rales.  Musculoskeletal:        General: Normal range of motion.  Skin:    General: Skin is warm and dry.  Neurological:     General: No focal deficit present.     Mental Status: He is alert and oriented to person, place, and time. Mental status is at baseline.  Psychiatric:        Mood and Affect: Mood normal.        Behavior: Behavior normal.        Thought Content: Thought content normal.      UC Treatments / Results  Labs (all labs ordered are listed, but only abnormal results are displayed) Labs Reviewed - No data to display  EKG   Radiology No results found.  Procedures Procedures (including critical care time)  Medications Ordered in UC Medications - No data to display  Initial Impression / Assessment and Plan / UC Course  I have reviewed the triage vital signs and the nursing notes.  Pertinent labs & imaging  results that were available during my care of the patient were reviewed by me and considered in my medical decision making (see chart for details).     MDM: 1.  Acute URI-Rx'd Zithromax : Take as directed; 2.  Cough, unspecified type-Rx'd prednisone  20 mg tablet: Take 3 tablets p.o. daily x 5 days; 3.  Bilateral impacted cerumen-bilateral ear lavage unsuccessful. Advised patient to take medications as directed with food to completion.  Advised patient to take prednisone  and Zithromax  daily to completion.  Advised patient may take OTC Debrox 4 drops twice daily for 3 to 4 days in both ear canals to assist with cerumen removal.  Encouraged to increase daily water intake to 64 ounces per day while taking these medications.  Advised if symptoms worsen and are unresolved please follow-up with your PCP, ENT, or here for further evaluation.  Patient discharged home, hemodynamically stable Final Clinical Impressions(s) / UC Diagnoses   Final diagnoses:  Cough, unspecified type  Acute URI  Bilateral impacted cerumen     Discharge Instructions      Advised patient to take medications as directed with food to completion.  Advised patient to take prednisone  and Zithromax  daily to completion.  Advised patient may take OTC Debrox 4 drops twice daily for 3 to 4 days in both ear canals to assist with cerumen removal.  Encouraged to increase daily water intake to 64 ounces per day while taking these medications.  Advised if symptoms worsen and are unresolved please follow-up with your PCP, ENT,  or here for further evaluation.     ED Prescriptions     Medication Sig Dispense Auth. Provider   azithromycin  (ZITHROMAX ) 250 MG tablet Take 1 tablet (250 mg total) by mouth daily. Take first 2 tablets together, then 1 every day until finished. 6 tablet Gini Caputo, FNP   predniSONE  (DELTASONE ) 20 MG tablet Take 3 tabs PO daily x 5 days. 15 tablet Destyne Goodreau, FNP      PDMP not reviewed this encounter.    Teddy Sharper, FNP 06/10/24 1034

## 2024-06-10 NOTE — ED Triage Notes (Signed)
 Pt presents to uc with co cough since last Sunday. Pt reports pmh emphysema and saw his pulmonologist on Tuesday. Pt reports they were not concerned at the visit. However his symptoms have worsened since the visit with increasing fatigue and sob. Pt reports he has been using vitamin c  and zinc.

## 2024-06-10 NOTE — Discharge Instructions (Addendum)
 Advised patient to take medications as directed with food to completion.  Advised patient to take prednisone  and Zithromax  daily to completion.  Advised patient may take OTC Debrox 4 drops twice daily for 3 to 4 days in both ear canals to assist with cerumen removal.  Encouraged to increase daily water intake to 64 ounces per day while taking these medications.  Advised if symptoms worsen and are unresolved please follow-up with your PCP, ENT, or here for further evaluation.

## 2024-10-02 ENCOUNTER — Ambulatory Visit
Admission: RE | Admit: 2024-10-02 | Discharge: 2024-10-02 | Disposition: A | Discharge status: Home / Self Care | Attending: Internal Medicine | Admitting: Internal Medicine

## 2024-10-02 ENCOUNTER — Ambulatory Visit

## 2024-10-02 VITALS — BP 116/83 | HR 75 | Temp 97.8°F

## 2024-10-02 DIAGNOSIS — M25512 Pain in left shoulder: Secondary | ICD-10-CM

## 2024-10-02 NOTE — ED Provider Notes (Addendum)
 [facility]    CSN: [id number] Arrival date & time: 10/02/24  1247      History   Chief Complaint Chief Complaint  Patient presents with   Shoulder Injury    lipped down the hill in my backyard. landed on my chest with arms outstretched. still hurts to lift. pain is worse with side motion and pressure(painful to hold arm up to blow nose for example). been doing Tylr=enol, ice packs and heatin pad - Entered by patient    HPI Melvin Martin is a 69 y.o. male.   69 year old male who presents urgent care with complaints of left shoulder pain.  He reports that yesterday he was going down a hill and began to slip.  He was trying to catch up with the slide but ended up falling forward catching himself with his arms.  He instantly had left shoulder pain.  Since that time he is having difficulty lifting his arm up without having to use the other arm and he reports that any movement of his arm coming across his body is painful.  He is able to move his fingers and feel his fingers.  He has tried Tylenol  for the pain.  He denies any previous injury to the shoulder.   Shoulder Injury Pertinent negatives include no chest pain, no abdominal pain and no shortness of breath.    Past Medical History:  Diagnosis Date   ADD (attention deficit disorder)    Asthmatic bronchitis    Cancer (HCC)    Chest pain    pt relates to GERDS and asthmatic bronchitis   CLL (chronic lymphocytic leukemia) (HCC)    COPD (chronic obstructive pulmonary disease) (HCC)    Coronary artery disease    GERD (gastroesophageal reflux disease)    Hyperlipidemia LDL goal < 70    Low testosterone    NSTEMI (non-ST elevated myocardial infarction) [facility]) December 2013   with PCI with DES to the ramus and LAD    Patient Active Problem List   Diagnosis Date Noted   Acute respiratory failure with hypoxia (HCC) 02/29/2020   Centrilobular emphysema (HCC) 02/29/2020   BPH (benign prostatic hyperplasia) 02/29/2020    Hyperkalemia 03/25/2018   Chest pain 03/25/2018   COPD (chronic obstructive pulmonary disease) (HCC)    CLL (chronic lymphocytic leukemia) (HCC) 01/06/2017   Coronary artery disease    Non-ST elevation myocardial infarction (NSTEMI), initial care episode (HCC) 08/18/2012   GERD (gastroesophageal reflux disease)    Dyslipidemia (high LDL; low HDL)     Past Surgical History:  Procedure Laterality Date   CORONARY ANGIOPLASTY WITH STENT PLACEMENT  08/18/2012   DES to the ramus and LAD   Cyst removal, mouth     LEFT HEART CATHETERIZATION WITH CORONARY ANGIOGRAM N/A 08/18/2012   Procedure: LEFT HEART CATHETERIZATION WITH CORONARY ANGIOGRAM;  Surgeon: Ozell Fell, MD;  Location: [facility] CATH LAB;  Service: Cardiovascular;  Laterality: N/A;   PERCUTANEOUS CORONARY STENT INTERVENTION (PCI-S)  08/18/2012   Procedure: PERCUTANEOUS CORONARY STENT INTERVENTION (PCI-S);  Surgeon: Ozell Fell, MD;  Location: [facility] CATH LAB;  Service: Cardiovascular;;       Home Medications    Prior to Admission medications  Medication Sig Start Date End Date Taking? Authorizing Provider  acyclovir  (ZOVIRAX ) 400 MG tablet Take 400 mg by mouth 2 (two) times daily.  08/08/18   [provider]  Ascorbic Acid  (VITAMIN C  WITH ROSE HIPS) 1000 MG tablet Take 1,000 mg by mouth daily.    [provider]  aspirin  81 MG chewable tablet Chew 81 mg by mouth daily.  08/19/12   [provider]  Azelastine  HCl 137 MCG/SPRAY SOLN Use 1 spray twice a day 10/04/20   Jordan, [name], MD  azithromycin  (ZITHROMAX ) 250 MG tablet Take 1 tablet (250 mg total) by mouth daily. Take first 2 tablets together, then 1 every day until finished. 06/10/24   Ragan, Michael, FNP  budesonide (PULMICORT) 0.5 MG/2ML nebulizer solution Inhale 0.5 mg into the lungs. 06/06/24 06/06/25  [provider]  HYDROcodone -acetaminophen  (NORCO) 7.5-325 MG tablet Take 1 tablet by mouth every 6 (six) hours as needed for moderate pain.  11/03/22   Maranda Jamee Jacob, MD  Ipratropium-Albuterol  (COMBIVENT RESPIMAT) 20-100 MCG/ACT AERS respimat Inhale 1 puff into the lungs every 6 (six) hours. 10/04/20   Jordan, [name], MD  LAGEVRIO 200 MG CAPS capsule SMARTSIG:4 Capsule(s) By Mouth 10/30/22   [provider]  loratadine  (CLARITIN ) 10 MG tablet 1 tablet Orally Once a day    [provider]  Multiple Vitamins-Minerals (MULTIVITAMIN) tablet Take 1 tablet by mouth daily. 10/04/20   Jordan, [name], MD  nitroGLYCERIN  (NITROSTAT ) 0.4 MG SL tablet DISSOLVE 1 TABLET UNDER THE TONGUE EVERY 5 MINUTES AS NEEDED FOR CHEST PAIN 05/16/21   Jordan, [name], MD  omeprazole (PRILOSEC) 40 MG capsule Take 40 mg by mouth daily.    [provider]  predniSONE  (DELTASONE ) 20 MG tablet Take 3 tabs PO daily x 5 days. 06/10/24   Teddy Sharper, FNP  RA Anselm Frizzle CAPS Take 800 mg 2 tablets twice a day 10/04/20   Jordan, [name], MD  REPATHA SURECLICK 140 MG/ML SOAJ Inject into the skin.    [provider]  rosuvastatin  (CRESTOR ) 10 MG tablet Take 10 mg by mouth daily.     [provider]  STIOLTO RESPIMAT 2.5-2.5 MCG/ACT AERS Inhale 2 puffs into the lungs daily. 03/19/18   [provider]  vitamin B-12 (CYANOCOBALAMIN) 1000 MCG tablet Take 1,000 mcg by mouth daily.    [provider]  Zinc 50 MG CAPS Take 50 mg by mouth daily.     [provider]    Family History Family History  Problem Relation Age of Onset   Emphysema Mother    Heart failure Father    Heart attack Father    Heart attack Cousin     Social History Social History[1]   Allergies   Metoprolol  and Imdur  [isosorbide  nitrate]   Review of Systems Review of Systems  Constitutional:  Negative for chills and fever.  HENT:  Negative for ear pain and sore throat.   Eyes:  Negative for pain and visual disturbance.  Respiratory:  Negative for cough and shortness of breath.   Cardiovascular:  Negative for chest pain and  palpitations.  Gastrointestinal:  Negative for abdominal pain and vomiting.  Genitourinary:  Negative for dysuria and hematuria.  Musculoskeletal:  Negative for arthralgias and back pain.       Left shoulder pain and decreased range of motion  Skin:  Negative for color change and rash.  Neurological:  Negative for seizures and syncope.  All other systems reviewed and are negative.    Physical Exam Triage Vital Signs ED Triage Vitals  Encounter Vitals Group     BP      Girls Systolic BP Percentile      Girls Diastolic BP Percentile      Boys Systolic BP Percentile  Boys Diastolic BP Percentile      Pulse      Resp      Temp      Temp src      SpO2      Weight      Height      Head Circumference      Peak Flow      Pain Score      Pain Loc      Pain Education      Exclude from Growth Chart    No data found.  Updated Vital Signs BP 116/83 (BP Location: Right Arm)   Pulse 75   Temp 97.8 F (36.6 C) (Oral)   SpO2 94%   Visual Acuity Right Eye Distance:   Left Eye Distance:   Bilateral Distance:    Right Eye Near:   Left Eye Near:    Bilateral Near:     Physical Exam Vitals and nursing note reviewed.  Constitutional:      General: He is not in acute distress.    Appearance: He is well-developed.  HENT:     Head: Normocephalic and atraumatic.  Eyes:     Conjunctiva/sclera: Conjunctivae normal.  Cardiovascular:     Rate and Rhythm: Normal rate and regular rhythm.     Heart sounds: No murmur heard. Pulmonary:     Effort: Pulmonary effort is normal. No respiratory distress.     Breath sounds: Normal breath sounds.  Abdominal:     Palpations: Abdomen is soft.     Tenderness: There is no abdominal tenderness.  Musculoskeletal:        General: No swelling.     Left shoulder: Tenderness (Especially around the [employer] joint) and bony tenderness present. No crepitus. Decreased range of motion. Normal strength. Normal pulse.     Left wrist: Normal pulse.      Cervical back: Neck supple.  Skin:    General: Skin is warm and dry.     Capillary Refill: Capillary refill takes less than 2 seconds.  Neurological:     Mental Status: He is alert.  Psychiatric:        Mood and Affect: Mood normal.      UC Treatments / Results  Labs (all labs ordered are listed, but only abnormal results are displayed) Labs Reviewed - No data to display  EKG   Radiology DG Shoulder Left Result Date: 10/02/2024 CLINICAL DATA:  Status post fall with left shoulder pain EXAM: LEFT SHOULDER - 3 VIEW COMPARISON:  Chest radiograph dated 06/19/2021 FINDINGS: There is no evidence of fracture or dislocation. Rounded ossific fragment projecting adjacent to the inferior glenoid. Mild degenerative changes of the glenohumeral joint. Possible blunting of the partially imaged left costophrenic angle. IMPRESSION: 1. No acute fracture or dislocation. 2. Rounded ossific fragment projecting adjacent to the inferior glenoid, which may represent sequela of prior trauma. 3. Possible blunting of the partially imaged left costophrenic angle, which may represent a small pleural effusion. Recommend correlation with dedicated chest radiograph. Electronically Signed   By: Limin  Xu M.D.   On: 10/02/2024 13:41    Procedures Procedures (including critical care time)  Medications Ordered in UC Medications - No data to display  Initial Impression / Assessment and Plan / UC Course  I have reviewed the triage vital signs and the nursing notes.  Pertinent labs & imaging results that were available during my care of the patient were reviewed by me and considered in my medical decision making (  see chart for details).     Acute pain of left shoulder - Plan: DG Shoulder Left, DG Shoulder Left   X-ray of the left shoulder done today.  Final evaluation by the radiologist shows the following: 1. No acute fracture or dislocation. 2. Rounded ossific fragment projecting adjacent to the  inferior glenoid, which may represent sequela of prior trauma.  This does not rule out a ligamentous type injury.  At this time we recommend using a shoulder sling to help support the arm and resting the arm for the next several days.  Alternate ibuprofen and Tylenol  for pain.  After about 3 to 4 days start to slowly increase activity.  If you are able to increase activity slowly then continue to increase to your baseline.  If you are unable to increase activity due to pain then recommend following up with an orthopedist for further evaluation.  Final Clinical Impressions(s) / UC Diagnoses   Final diagnoses:  Acute pain of left shoulder     Discharge Instructions      X-ray of the left shoulder done today.  Final evaluation by the radiologist shows the following: 1. No acute fracture or dislocation. 2. Rounded ossific fragment projecting adjacent to the inferior glenoid, which may represent sequela of prior trauma.  This does not rule out a ligamentous type injury.  At this time we recommend using a shoulder sling to help support the arm and resting the arm for the next several days.  Alternate ibuprofen and Tylenol  for pain.  After about 3 to 4 days start to slowly increase activity.  If you are able to increase activity slowly then continue to increase to your baseline.  If you are unable to increase activity due to pain then recommend following up with an orthopedist for further evaluation.     ED Prescriptions   None    PDMP not reviewed this encounter.    Teresa Almarie DELENA DEVONNA 10/02/24 1403     [1]  Social History Tobacco Use   Smoking status: Never   Smokeless tobacco: Never  Vaping Use   Vaping status: Never Used  Substance Use Topics   Alcohol use: No   Drug use: No     Teresa Almarie DELENA, PA-C 10/02/24 1403  "

## 2024-10-02 NOTE — Discharge Instructions (Addendum)
 X-ray of the left shoulder done today.  Final evaluation by the radiologist shows the following: 1. No acute fracture or dislocation. 2. Rounded ossific fragment projecting adjacent to the inferior glenoid, which may represent sequela of prior trauma.  This does not rule out a ligamentous type injury.  At this time we recommend using a shoulder sling to help support the arm and resting the arm for the next several days.  Alternate ibuprofen and Tylenol  for pain.  After about 3 to 4 days start to slowly increase activity.  If you are able to increase activity slowly then continue to increase to your baseline.  If you are unable to increase activity due to pain then recommend following up with an orthopedist for further evaluation.
